# Patient Record
Sex: Male | Born: 1935
Health system: Southern US, Community
[De-identification: ages and names within clinical notes are randomized; demographics above are authoritative.]

## PROBLEM LIST (undated history)

## (undated) DIAGNOSIS — K219 Gastro-esophageal reflux disease without esophagitis: Secondary | ICD-10-CM

## (undated) DIAGNOSIS — L409 Psoriasis, unspecified: Secondary | ICD-10-CM

## (undated) DIAGNOSIS — K4090 Unilateral inguinal hernia, without obstruction or gangrene, not specified as recurrent: Secondary | ICD-10-CM

## (undated) DIAGNOSIS — N400 Enlarged prostate without lower urinary tract symptoms: Secondary | ICD-10-CM

## (undated) DIAGNOSIS — M1990 Unspecified osteoarthritis, unspecified site: Secondary | ICD-10-CM

## (undated) DIAGNOSIS — H04129 Dry eye syndrome of unspecified lacrimal gland: Secondary | ICD-10-CM

## (undated) DIAGNOSIS — R0602 Shortness of breath: Secondary | ICD-10-CM

## (undated) DIAGNOSIS — Z8709 Personal history of other diseases of the respiratory system: Secondary | ICD-10-CM

## (undated) DIAGNOSIS — M810 Age-related osteoporosis without current pathological fracture: Secondary | ICD-10-CM

## (undated) DIAGNOSIS — F419 Anxiety disorder, unspecified: Secondary | ICD-10-CM

## (undated) DIAGNOSIS — R011 Cardiac murmur, unspecified: Secondary | ICD-10-CM

## (undated) DIAGNOSIS — R399 Unspecified symptoms and signs involving the genitourinary system: Secondary | ICD-10-CM

## (undated) DIAGNOSIS — L723 Sebaceous cyst: Secondary | ICD-10-CM

## (undated) DIAGNOSIS — IMO0002 Reserved for concepts with insufficient information to code with codable children: Secondary | ICD-10-CM

## (undated) DIAGNOSIS — C679 Malignant neoplasm of bladder, unspecified: Secondary | ICD-10-CM

## (undated) DIAGNOSIS — J449 Chronic obstructive pulmonary disease, unspecified: Secondary | ICD-10-CM

## (undated) DIAGNOSIS — D696 Thrombocytopenia, unspecified: Secondary | ICD-10-CM

## (undated) DIAGNOSIS — K449 Diaphragmatic hernia without obstruction or gangrene: Secondary | ICD-10-CM

## (undated) DIAGNOSIS — J309 Allergic rhinitis, unspecified: Secondary | ICD-10-CM

## (undated) HISTORY — PX: TONSILLECTOMY: SUR1361

## (undated) HISTORY — DX: Age-related osteoporosis without current pathological fracture: M81.0

## (undated) HISTORY — PX: CATARACT EXTRACTION W/ INTRAOCULAR LENS  IMPLANT, BILATERAL: SHX1307

## (undated) HISTORY — DX: Psoriasis, unspecified: L40.9

## (undated) HISTORY — DX: Sebaceous cyst: L72.3

## (undated) HISTORY — PX: OTHER SURGICAL HISTORY: SHX169

## (undated) HISTORY — DX: Diaphragmatic hernia without obstruction or gangrene: K44.9

## (undated) HISTORY — DX: Allergic rhinitis, unspecified: J30.9

## (undated) HISTORY — DX: Unilateral inguinal hernia, without obstruction or gangrene, not specified as recurrent: K40.90

## (undated) HISTORY — DX: Gastro-esophageal reflux disease without esophagitis: K21.9

## (undated) HISTORY — DX: Reserved for concepts with insufficient information to code with codable children: IMO0002

## (undated) HISTORY — DX: Anxiety disorder, unspecified: F41.9

## (undated) HISTORY — DX: Thrombocytopenia, unspecified: D69.6

## (undated) HISTORY — DX: Benign prostatic hyperplasia without lower urinary tract symptoms: N40.0

## (undated) HISTORY — PX: CARDIOVASCULAR STRESS TEST: SHX262

---
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
## 1996-03-23 HISTORY — PX: TRANSURETHRAL RESECTION OF PROSTATE: SHX73

## 2006-11-16 ENCOUNTER — Encounter: Admission: RE | Admit: 2006-11-16 | Discharge: 2006-11-16 | Payer: Self-pay | Admitting: Internal Medicine

## 2006-11-18 ENCOUNTER — Ambulatory Visit: Payer: Self-pay | Admitting: Family Medicine

## 2006-11-19 DIAGNOSIS — F1320 Sedative, hypnotic or anxiolytic dependence, uncomplicated: Secondary | ICD-10-CM | POA: Insufficient documentation

## 2006-11-19 DIAGNOSIS — M949 Disorder of cartilage, unspecified: Secondary | ICD-10-CM

## 2006-11-19 DIAGNOSIS — M899 Disorder of bone, unspecified: Secondary | ICD-10-CM | POA: Insufficient documentation

## 2006-11-23 ENCOUNTER — Telehealth: Payer: Self-pay | Admitting: *Deleted

## 2006-12-01 ENCOUNTER — Encounter (INDEPENDENT_AMBULATORY_CARE_PROVIDER_SITE_OTHER): Payer: Self-pay | Admitting: Internal Medicine

## 2007-01-12 ENCOUNTER — Ambulatory Visit: Payer: Self-pay | Admitting: Family Medicine

## 2007-02-10 ENCOUNTER — Ambulatory Visit: Payer: Self-pay | Admitting: Family Medicine

## 2007-02-10 DIAGNOSIS — H209 Unspecified iridocyclitis: Secondary | ICD-10-CM | POA: Insufficient documentation

## 2007-02-10 DIAGNOSIS — J309 Allergic rhinitis, unspecified: Secondary | ICD-10-CM | POA: Insufficient documentation

## 2007-02-10 DIAGNOSIS — F411 Generalized anxiety disorder: Secondary | ICD-10-CM | POA: Insufficient documentation

## 2007-02-10 DIAGNOSIS — I498 Other specified cardiac arrhythmias: Secondary | ICD-10-CM

## 2007-02-16 ENCOUNTER — Encounter (INDEPENDENT_AMBULATORY_CARE_PROVIDER_SITE_OTHER): Payer: Self-pay | Admitting: Internal Medicine

## 2007-12-14 ENCOUNTER — Ambulatory Visit: Payer: Self-pay | Admitting: Family Medicine

## 2009-04-04 LAB — PULMONARY FUNCTION TEST

## 2009-05-01 ENCOUNTER — Encounter: Payer: Self-pay | Admitting: Family Medicine

## 2009-10-07 ENCOUNTER — Encounter: Admission: RE | Admit: 2009-10-07 | Discharge: 2009-10-07 | Payer: Self-pay | Admitting: Internal Medicine

## 2009-10-10 HISTORY — PX: TRANSTHORACIC ECHOCARDIOGRAM: SHX275

## 2010-03-13 ENCOUNTER — Encounter: Payer: Self-pay | Admitting: Family Medicine

## 2010-04-22 NOTE — Miscellaneous (Signed)
Summary: ROI  ROI   Imported By: Bradly Bienenstock 05/01/2009 17:09:40  _____________________________________________________________________  External Attachment:    Type:   Image     Comment:   External Document

## 2010-04-24 NOTE — Miscellaneous (Signed)
  Clinical Lists Changes  Problems: Removed problem of NEED PROPHYLACTIC VACCINATION&INOCULATION FLU (ICD-V04.81) Removed problem of HIATAL HERNIA WITH REFLUX, HX OF (ICD-V12.79) Removed problem of History of  PSORIASIS (ICD-696.1) Removed problem of History of  CERUMEN IMPACTION, RECURRENT (ICD-380.4) Removed problem of CATARACT EXTRACTION STATUS (ICD-V45.61)

## 2010-05-12 ENCOUNTER — Encounter: Payer: Self-pay | Admitting: *Deleted

## 2011-06-17 ENCOUNTER — Other Ambulatory Visit: Payer: Self-pay | Admitting: Geriatric Medicine

## 2011-06-17 DIAGNOSIS — R1310 Dysphagia, unspecified: Secondary | ICD-10-CM

## 2011-06-19 ENCOUNTER — Other Ambulatory Visit: Payer: Self-pay

## 2012-08-11 ENCOUNTER — Other Ambulatory Visit: Payer: Self-pay | Admitting: Nurse Practitioner

## 2012-08-11 DIAGNOSIS — R918 Other nonspecific abnormal finding of lung field: Secondary | ICD-10-CM

## 2012-08-12 ENCOUNTER — Ambulatory Visit
Admission: RE | Admit: 2012-08-12 | Discharge: 2012-08-12 | Disposition: A | Discharge status: Home / Self Care | Payer: Medicare PPO | Source: Ambulatory Visit | Attending: Nurse Practitioner | Admitting: Nurse Practitioner

## 2012-08-12 DIAGNOSIS — R918 Other nonspecific abnormal finding of lung field: Secondary | ICD-10-CM

## 2012-08-12 MED ORDER — IOHEXOL 300 MG/ML  SOLN
75.0000 mL | Freq: Once | INTRAMUSCULAR | Status: AC | PRN
  Administered 2012-08-12: 75 mL via INTRAVENOUS

## 2012-08-17 ENCOUNTER — Encounter: Payer: Medicare PPO | Admitting: Thoracic Surgery (Cardiothoracic Vascular Surgery)

## 2012-08-17 ENCOUNTER — Other Ambulatory Visit: Payer: Self-pay | Admitting: *Deleted

## 2012-08-17 ENCOUNTER — Other Ambulatory Visit: Payer: Self-pay

## 2012-08-17 ENCOUNTER — Institutional Professional Consult (permissible substitution) (INDEPENDENT_AMBULATORY_CARE_PROVIDER_SITE_OTHER): Payer: Medicare PPO | Admitting: Thoracic Surgery (Cardiothoracic Vascular Surgery)

## 2012-08-17 VITALS — BP 130/72 | HR 60 | Resp 18 | Ht 71.0 in | Wt 144.0 lb

## 2012-08-17 DIAGNOSIS — R222 Localized swelling, mass and lump, trunk: Secondary | ICD-10-CM

## 2012-08-17 DIAGNOSIS — J850 Gangrene and necrosis of lung: Secondary | ICD-10-CM | POA: Insufficient documentation

## 2012-08-17 DIAGNOSIS — R918 Other nonspecific abnormal finding of lung field: Secondary | ICD-10-CM

## 2012-08-17 DIAGNOSIS — D381 Neoplasm of uncertain behavior of trachea, bronchus and lung: Secondary | ICD-10-CM

## 2012-08-17 NOTE — Progress Notes (Signed)
PCP is Loreli Slot, MD Referring Provider is Stoneking, Danford Bad., *  Chief Complaint  Patient presents with  . Lung Mass    surgical eval on lung mass seen on Chest CT 08/12/12    HPI: 79 year old sent for consultation regarding a right apical mass.  Mr. Cieslinski is a 79 year old male with a history of pipe smoking (quit 35 years ago). He was in his usual state of health until about 3 weeks ago. He developed a sinus infection. These are quite common for him. He was treated with Cipro and prednisone. About 10 days ago he noted some blood in his sputum. He had been coughing up mucous about 3 or 4 times a day. He was not having difficulty bringing up the mucous. He first noted some streaks of blood in the sputum. Over the past 10 days his continued to notice some times purulent sputum, sometimes mixed with old blood and sometimes with streaks of fresh blood. He's not having any chest pain or shoulder or arm pain. He has not noted his eyelid drooping. A chest x-ray was done which showed a suggestion of a right upper lobe mass. A CT of the chest was done on 08/12/2012 and it showed a mass in the right apex adjacent to the posterior chest wall in an area of old scar (which was present on CT done in 2011). There is no mediastinal or hilar adenopathy.  He says that over the past 6-7 years she said shortness of breath with initiation of exertion. He had been worked up for cardiac stress test in 2008 which was negative. He had normal pulmonary function testing in 2011. He says that he gets short of breath when he first starts to exert himself such as when he first gets on a treadmill her first starts walking faster. If he continues on the shortness of breath will resolve. He says that his total exercise tolerance has gone down slightly since he got the sinus infection. He has had a mild sensation of tightness associated with shortness of breath. He denies weight loss or loss of appetite.  Past Medical  History  Diagnosis Date  . Lung mass   . BPH (benign prostatic hyperplasia)   . GERD (gastroesophageal reflux disease)   . Psoriasis   . Left inguinal hernia   . Osteoporosis   . Anxiety   . Hiatal hernia   . Positional vertigo   . Prolapse of mitral valve     click  . Sebaceous cyst     left shoulder  . Allergic rhinitis   . CAD (coronary artery disease) 08/12/12    LAD calcification noted on CT    Past Surgical History  Procedure Laterality Date  . Appendectomy      1945  . Transurethral resection of prostate      Family History  Problem Relation Age of Onset  . Hyperlipidemia Father   . Hypertension Father   . Diabetes Father   . Colon polyps Mother   . Heart disease Father   . Heart disease Brother     #1  . Heart disease Brother     #2    Social History History  Substance Use Topics  . Smoking status: Former Smoker -- 1.00 packs/day for 15 years    Types: Pipe    Quit date: 03/23/1968  . Smokeless tobacco: Not on file  . Alcohol Use: Yes    Current Outpatient Prescriptions  Medication Sig Dispense Refill  . Calcium  Citrate-Vitamin D 315-250 MG-UNIT TABS Take by mouth daily.      . fish oil-omega-3 fatty acids 1000 MG capsule Take 1 g by mouth daily.      . Hydrocortisone Butyr Lipo Base 0.1 % CREA Apply topically as needed.      Marland Kitchen LORazepam (ATIVAN) 0.5 MG tablet Take 0.5 mg by mouth 3 (three) times daily.        . Multiple Vitamins-Minerals (ICAPS PO) Take by mouth 2 (two) times daily.       No current facility-administered medications for this visit.    No Known Allergies  Review of Systems  Constitutional: Positive for chills. Negative for fever, activity change, appetite change, fatigue and unexpected weight change.  HENT: Positive for congestion, rhinorrhea, sneezing and postnasal drip.   Respiratory: Positive for cough (+ hemoptysis), chest tightness (vague with SOB) and shortness of breath (with initiation of exercise).   Cardiovascular:  Negative for chest pain, palpitations and leg swelling.  Gastrointestinal:       Hiatal hernia, belching  Genitourinary: Positive for urgency and frequency.       TURP 15 years ago  Skin:       psoriasis  Neurological: Negative for syncope and speech difficulty.  All other systems reviewed and are negative.    BP 130/72  Pulse 60  Resp 18  Ht 5\' 11"  (1.803 m)  Wt 144 lb (65.318 kg)  BMI 20.09 kg/m2  SpO2 98% Physical Exam  Vitals reviewed. Constitutional: He is oriented to person, place, and time. He appears well-developed and well-nourished. No distress.  HENT:  Head: Normocephalic and atraumatic.  Eyes: EOM are normal. Pupils are equal, round, and reactive to light.  Neck: Neck supple. No thyromegaly present.  No bruits  Cardiovascular: Normal rate, regular rhythm, normal heart sounds and intact distal pulses.  Exam reveals no gallop and no friction rub.   No murmur heard. Pulmonary/Chest: Effort normal and breath sounds normal. He has no wheezes. He has no rales. He exhibits no tenderness.  Abdominal: Soft. There is no tenderness.  Musculoskeletal: He exhibits no edema.  Lymphadenopathy:    He has no cervical adenopathy.  Neurological: He is alert and oriented to person, place, and time. No cranial nerve deficit.  Skin: Skin is warm and dry.     Diagnostic Tests: CT of chest 08/12/2012 Clinical Data: Abnormal chest x-ray with a mass questioned in the  right lung apex. History of hemoptysis, pipe smoking for many  years  CT CHEST WITH CONTRAST  Technique: Multidetector CT imaging of the chest was performed  following the standard protocol during bolus administration of  intravenous contrast.  Contrast: 75mL OMNIPAQUE IOHEXOL 300 MG/ML SOLN  Comparison: Chest x-ray of 08/11/2012 and CT chest of 10/07/2009  Findings: There is a soft tissue mass within the right lung apex  abutting the pleura measuring 3.3 x 2.6 x 4.8 cm containing some  internal calcifications.  This mass is worrisome for malignancy,  possibly a superior sulcus tumor. PET CT is recommended to assess  for any mediastinal or metastatic involvement. No other lung  nodule is seen. No pleural effusion is noted.  On soft tissue window images, the thyroid gland is unremarkable.  No definite mediastinal or hilar involvement is seen. There is  coronary artery calcifications primarily involving the left  anterior descending artery. Mild cardiomegaly is noted and there  is a small pericardial effusion noted. There appears to be a  gastric fundal diverticulum present. The liver is  unremarkable.  The thoracic spine is unremarkable other than a slight curvature  convex to the right.  IMPRESSION:  1. 3.3 x 2.6 x 4.8 cm soft tissue mass in the right lung apex with  internal calcifications, suspicious for lung carcinoma, possibly a  superior sulcus tumor. Recommend PET-CT.  2. No definite mediastinal or hilar adenopathy.  3. Mild cardiomegaly with a small pericardial effusion.  4. Calcifications of the left anterior descending coronary artery.  Original Report Authenticated By: Dwyane Dee, M.D.   Impression: Mr. Guinta is a 79 year old gentleman with a newly discovered right apical soft tissue mass. This is high in the right apex adjacent to the posterior chest wall. It is possibly a superior sulcus tumor but he does not had any of the classic Pancoast symptoms. In any event is highly suspicious for primary bronchogenic carcinoma. It has to be considered that unless he can be proven otherwise.  Mr. Canterbury is an very good shape overall and I think he could tolerate a very aggressive approach to treatment. My initial impression would be the best way to proceed with be to biopsy the mass to confirm that his cancer and then treat with chemotherapy and radiation therapy to be followed by resection.  I recommended that we do a CT guided needle biopsy to establish a diagnosis. He also needs a PET/CT for  staging purposes. We will repeat his pulmonary function testing even though it was normal only a few years ago. Finally he had pretty significant calcification of his LAD on the CT of his chest. Therefore do think he would need to be cleared by cardiologist prior to surgery.  I had a long discussion with Mr. Dicostanzo regarding the findings and my recommendations. I think he has a good understanding of the approach outlined.  Plan: CT-guided needle biopsy  PET/CT  Pulmonary function testing with room air blood gas  Cardiology consult  Will plan to see him back next week. If the biopsy is positive we will plan to have him seen by oncology and radiation oncology in [place].

## 2012-08-19 ENCOUNTER — Other Ambulatory Visit: Payer: Self-pay | Admitting: *Deleted

## 2012-08-19 ENCOUNTER — Other Ambulatory Visit: Payer: Self-pay | Admitting: Radiology

## 2012-08-22 ENCOUNTER — Ambulatory Visit (HOSPITAL_COMMUNITY)
Admission: RE | Admit: 2012-08-22 | Discharge: 2012-08-22 | Disposition: A | Discharge status: Home / Self Care | Payer: Medicare PPO | Source: Ambulatory Visit | Attending: Thoracic Surgery (Cardiothoracic Vascular Surgery) | Admitting: Thoracic Surgery (Cardiothoracic Vascular Surgery)

## 2012-08-22 ENCOUNTER — Encounter (HOSPITAL_COMMUNITY)
Admission: RE | Admit: 2012-08-22 | Discharge: 2012-08-22 | Disposition: A | Discharge status: Home / Self Care | Payer: Medicare PPO | Source: Ambulatory Visit | Attending: Thoracic Surgery (Cardiothoracic Vascular Surgery) | Admitting: Thoracic Surgery (Cardiothoracic Vascular Surgery)

## 2012-08-22 DIAGNOSIS — R042 Hemoptysis: Secondary | ICD-10-CM | POA: Insufficient documentation

## 2012-08-22 DIAGNOSIS — J3489 Other specified disorders of nose and nasal sinuses: Secondary | ICD-10-CM | POA: Insufficient documentation

## 2012-08-22 DIAGNOSIS — R222 Localized swelling, mass and lump, trunk: Secondary | ICD-10-CM | POA: Insufficient documentation

## 2012-08-22 DIAGNOSIS — R05 Cough: Secondary | ICD-10-CM | POA: Insufficient documentation

## 2012-08-22 DIAGNOSIS — R0789 Other chest pain: Secondary | ICD-10-CM | POA: Insufficient documentation

## 2012-08-22 DIAGNOSIS — R0602 Shortness of breath: Secondary | ICD-10-CM | POA: Insufficient documentation

## 2012-08-22 DIAGNOSIS — R918 Other nonspecific abnormal finding of lung field: Secondary | ICD-10-CM

## 2012-08-22 DIAGNOSIS — R059 Cough, unspecified: Secondary | ICD-10-CM | POA: Insufficient documentation

## 2012-08-22 LAB — PULMONARY FUNCTION TEST

## 2012-08-22 LAB — GLUCOSE, CAPILLARY: Glucose-Capillary: 103 mg/dL — ABNORMAL HIGH (ref 70–99)

## 2012-08-22 MED ORDER — FLUDEOXYGLUCOSE F - 18 (FDG) INJECTION
17.8000 | Freq: Once | INTRAVENOUS | Status: AC | PRN
  Administered 2012-08-22: 17.8 via INTRAVENOUS

## 2012-08-22 MED ORDER — ALBUTEROL SULFATE (5 MG/ML) 0.5% IN NEBU
2.5000 mg | INHALATION_SOLUTION | Freq: Once | RESPIRATORY_TRACT | Status: AC
  Administered 2012-08-22: 2.5 mg via RESPIRATORY_TRACT

## 2012-08-23 ENCOUNTER — Encounter (HOSPITAL_COMMUNITY): Payer: Self-pay | Admitting: Pharmacy Technician

## 2012-08-24 ENCOUNTER — Encounter (HOSPITAL_COMMUNITY): Payer: Self-pay

## 2012-08-24 ENCOUNTER — Ambulatory Visit (HOSPITAL_COMMUNITY)
Admission: RE | Admit: 2012-08-24 | Discharge: 2012-08-24 | Disposition: A | Discharge status: Home / Self Care | Payer: Medicare PPO | Source: Ambulatory Visit | Attending: Interventional Radiology | Admitting: Interventional Radiology

## 2012-08-24 ENCOUNTER — Ambulatory Visit (HOSPITAL_COMMUNITY)
Admission: RE | Admit: 2012-08-24 | Discharge: 2012-08-24 | Disposition: A | Discharge status: Home / Self Care | Payer: Medicare PPO | Source: Ambulatory Visit | Attending: Thoracic Surgery (Cardiothoracic Vascular Surgery) | Admitting: Thoracic Surgery (Cardiothoracic Vascular Surgery)

## 2012-08-24 DIAGNOSIS — R222 Localized swelling, mass and lump, trunk: Secondary | ICD-10-CM | POA: Insufficient documentation

## 2012-08-24 DIAGNOSIS — M810 Age-related osteoporosis without current pathological fracture: Secondary | ICD-10-CM | POA: Insufficient documentation

## 2012-08-24 DIAGNOSIS — I2510 Atherosclerotic heart disease of native coronary artery without angina pectoris: Secondary | ICD-10-CM | POA: Insufficient documentation

## 2012-08-24 DIAGNOSIS — J984 Other disorders of lung: Secondary | ICD-10-CM | POA: Insufficient documentation

## 2012-08-24 DIAGNOSIS — K219 Gastro-esophageal reflux disease without esophagitis: Secondary | ICD-10-CM | POA: Insufficient documentation

## 2012-08-24 DIAGNOSIS — J8410 Pulmonary fibrosis, unspecified: Secondary | ICD-10-CM | POA: Insufficient documentation

## 2012-08-24 DIAGNOSIS — D381 Neoplasm of uncertain behavior of trachea, bronchus and lung: Secondary | ICD-10-CM

## 2012-08-24 DIAGNOSIS — N400 Enlarged prostate without lower urinary tract symptoms: Secondary | ICD-10-CM | POA: Insufficient documentation

## 2012-08-24 LAB — CBC
MCV: 82.6 fL (ref 78.0–100.0)
Platelets: 185 10*3/uL (ref 150–400)
RDW: 13.8 % (ref 11.5–15.5)
WBC: 5.1 10*3/uL (ref 4.0–10.5)

## 2012-08-24 LAB — APTT: aPTT: 42 seconds — ABNORMAL HIGH (ref 24–37)

## 2012-08-24 LAB — PROTIME-INR: Prothrombin Time: 13.3 seconds (ref 11.6–15.2)

## 2012-08-24 MED ORDER — SODIUM CHLORIDE 0.9 % IV SOLN
INTRAVENOUS | Status: DC
  Administered 2012-08-24: 20 mL/h via INTRAVENOUS

## 2012-08-24 MED ORDER — MIDAZOLAM HCL 2 MG/2ML IJ SOLN
INTRAMUSCULAR | Status: AC
  Filled 2012-08-24: qty 4

## 2012-08-24 MED ORDER — FENTANYL CITRATE 0.05 MG/ML IJ SOLN
INTRAMUSCULAR | Status: AC
  Filled 2012-08-24: qty 4

## 2012-08-24 MED ORDER — MIDAZOLAM HCL 2 MG/2ML IJ SOLN
INTRAMUSCULAR | Status: AC | PRN
  Administered 2012-08-24 (×2): 1 mg via INTRAVENOUS

## 2012-08-24 MED ORDER — FENTANYL CITRATE 0.05 MG/ML IJ SOLN
INTRAMUSCULAR | Status: AC | PRN
  Administered 2012-08-24: 100 ug via INTRAVENOUS

## 2012-08-24 NOTE — Procedures (Signed)
Interventional Radiology Procedure Note  Procedure: CT guided biopsy of right apical PET + mass Complications: No immediate Recommendations: - Bedrest until CXR cleared.  Minimize talking, coughing or otherwise straining.  - Follow up 2 hr CXR pending   Signed,  Sterling Big, MD Vascular & Interventional Radiologist [facility] Radiology

## 2012-08-24 NOTE — H&P (Signed)
Agree with PA note.  PET+ right apical mass concerning for primary bronchogenic ca.  Will proceed with bx.  Signed,  Sterling Big, MD Vascular & Interventional Radiologist [facility] Radiology

## 2012-08-24 NOTE — H&P (Signed)
Tony Prince is an 79 y.o. male.   Chief Complaint: "Im here to get a lung biopsy" HPI: Patient with prior history of smoking and hypermetabolic right apical lung mass noted on recent PET presents today for CT guided right lung mass biopsy.  Past Medical History  Diagnosis Date  . Lung mass   . BPH (benign prostatic hyperplasia)   . GERD (gastroesophageal reflux disease)   . Psoriasis   . Left inguinal hernia   . Osteoporosis   . Anxiety   . Hiatal hernia   . Positional vertigo   . Prolapse of mitral valve     click  . Sebaceous cyst     left shoulder  . Allergic rhinitis   . CAD (coronary artery disease) 08/12/12    LAD calcification noted on CT    Past Surgical History  Procedure Laterality Date  . Appendectomy      1945  . Transurethral resection of prostate      Family History  Problem Relation Age of Onset  . Hyperlipidemia Father   . Hypertension Father   . Diabetes Father   . Colon polyps Mother   . Heart disease Father   . Heart disease Brother     #1  . Heart disease Brother     #2   Social History:  reports that he quit smoking about 44 years ago. His smoking use included Pipe. He does not have any smokeless tobacco history on file. He reports that  drinks alcohol. He reports that he does not use illicit drugs.  Allergies: No Known Allergies  Current outpatient prescriptions:Calcium Citrate-Vitamin D 315-250 MG-UNIT TABS, Take by mouth daily., Disp: , Rfl: ;  fish oil-omega-3 fatty acids 1000 MG capsule, Take 1 g by mouth daily., Disp: , Rfl: ;  Hydrocortisone Butyr Lipo Base 0.1 % CREA, Apply 1 application topically as needed. psoriasis, Disp: , Rfl: ;  LORazepam (ATIVAN) 0.5 MG tablet, Take 0.5 mg by mouth 3 (three) times daily.  , Disp: , Rfl:  Multiple Vitamins-Minerals (ICAPS PO), Take 2 tablets by mouth daily. , Disp: , Rfl: ;  Polyethyl Glycol-Propyl Glycol (SYSTANE ULTRA) 0.4-0.3 % SOLN, Apply 1 drop to eye 2 (two) times daily as needed (dry eyes).,  Disp: , Rfl:  Current facility-administered medications:0.9 %  sodium chloride infusion, , Intravenous, Continuous, Brayton El, PA-C, Last Rate: 20 mL/hr at 08/24/12 0907, 20 mL/hr at 08/24/12 0907   Results for orders placed during the hospital encounter of 08/24/12 (from the past 48 hour(s))  APTT     Status: Abnormal   Collection Time    08/24/12  9:05 AM      Result Value Range   aPTT 42 (*) 24 - 37 seconds   Comment:            IF BASELINE aPTT IS ELEVATED,     SUGGEST PATIENT RISK ASSESSMENT     BE USED TO DETERMINE APPROPRIATE     ANTICOAGULANT THERAPY.  CBC     Status: None   Collection Time    08/24/12  9:05 AM      Result Value Range   WBC 5.1  4.0 - 10.5 K/uL   RBC 5.07  4.22 - 5.81 MIL/uL   Hemoglobin 14.3  13.0 - 17.0 g/dL   HCT 11.9  14.7 - 82.9 %   MCV 82.6  78.0 - 100.0 fL   MCH 28.2  26.0 - 34.0 pg   MCHC 34.1  30.0 - 36.0  g/dL   RDW 09.8  11.9 - 14.7 %   Platelets 185  150 - 400 K/uL  PROTIME-INR     Status: None   Collection Time    08/24/12  9:05 AM      Result Value Range   Prothrombin Time 13.3  11.6 - 15.2 seconds   INR 1.02  0.00 - 1.49   Nm Pet Image Initial (pi) Skull Base To Thigh  08/22/2012   *RADIOLOGY REPORT*  Clinical Data: Initial treatment strategy for right upper lobe lung mass.  NUCLEAR MEDICINE PET SKULL BASE TO THIGH  Fasting Blood Glucose:  103  Technique:  17.8 mCi F-18 FDG was injected intravenously. CT data was obtained and used for attenuation correction and anatomic localization only.  (This was not acquired as a diagnostic CT examination.) Additional exam technical data entered on technologist worksheet.  Comparison:  Chest CT 08/12/2012.  Findings:  Neck: No hypermetabolic lymph nodes in the neck.  Chest:  The right apical lung mass demonstrates marked FDG uptake with SUV max of 54.0.  No hypermetabolic mediastinal or hilar lymph nodes.  Abdomen/Pelvis:  No abnormal hypermetabolic activity within the liver, pancreas, adrenal glands,  or spleen.  No hypermetabolic lymph nodes in the abdomen or pelvis.  Skeleton:  No focal hypermetabolic activity to suggest skeletal metastasis.  IMPRESSION:  1.  Hypermetabolic right apical lung mass consistent with primary lung neoplasm. 2.  No findings for mediastinal/hilar adenopathy or metastatic disease in the chest, abdomen or pelvis.   Original Report Authenticated By: Rudie Meyer, M.D.    Review of Systems  Constitutional: Negative for fever and chills.  HENT:       Recent nosebleed following exercise  Respiratory: Positive for cough, hemoptysis and shortness of breath.   Cardiovascular: Negative for chest pain.  Gastrointestinal: Negative for nausea, vomiting and abdominal pain.  Genitourinary: Positive for urgency and frequency.  Musculoskeletal: Positive for back pain.       Occ rt shoulder pain  Neurological: Negative for headaches.    Blood pressure 127/79, pulse 65, temperature 97.6 F (36.4 C), temperature source Oral, resp. rate 20, height 5\' 11"  (1.803 m), weight 144 lb (65.318 kg), SpO2 100.00%. Physical Exam  Constitutional: He is oriented to person, place, and time.  Thin elderly male in NAD  Cardiovascular: Normal rate and regular rhythm.   Respiratory: Effort normal and breath sounds normal.  GI: Soft. Bowel sounds are normal. He exhibits no distension. There is no tenderness.  Musculoskeletal: Normal range of motion. He exhibits no edema.  Neurological: He is alert and oriented to person, place, and time.     Assessment/Plan Pt with prior hx of smoking , now with hypermetabolic right apical lung mass on recent PET scan. Plan is for CT guided biopsy of the lung mass today. Details/risks of procedure d/w pt with his understanding and consent.  ALLRED,D KEVIN 08/24/2012, 9:41 AM

## 2012-08-30 ENCOUNTER — Ambulatory Visit (INDEPENDENT_AMBULATORY_CARE_PROVIDER_SITE_OTHER): Payer: Medicare PPO | Admitting: Thoracic Surgery (Cardiothoracic Vascular Surgery)

## 2012-08-30 ENCOUNTER — Encounter: Payer: Self-pay | Admitting: Thoracic Surgery (Cardiothoracic Vascular Surgery)

## 2012-08-30 VITALS — BP 130/74 | HR 70 | Resp 20 | Ht 71.0 in | Wt 144.0 lb

## 2012-08-30 DIAGNOSIS — R918 Other nonspecific abnormal finding of lung field: Secondary | ICD-10-CM

## 2012-08-30 DIAGNOSIS — Z9889 Other specified postprocedural states: Secondary | ICD-10-CM

## 2012-08-30 DIAGNOSIS — R222 Localized swelling, mass and lump, trunk: Secondary | ICD-10-CM

## 2012-08-30 NOTE — Progress Notes (Signed)
HPI:  Mr. Curran returns to discuss the results of his PET/CT and biopsy. He is a 79 year old gentleman with a past history of pipe smoking. He recently developed hemoptysis. Workup revealed a mass in the right apex associated with old scar tissue. This is highly suspicious for lung cancer. A PET CT was done which showed the lesion to be markedly hypermetabolic with an SUV of 54. He also had a needle biopsy done which showed inflammation, fibrosis, and calcification. No malignancy was seen on the biopsy.  In the interim since his last visit Mr. Fessel says that he had hemoptysis and bleeding from his nose after working out. He felt this was from the lungs however from his description it sounds more like a nosebleed. Since that time he has noted a decrease in the amount of hemoptysis.  Past Medical History  Diagnosis Date  . Lung mass   . BPH (benign prostatic hyperplasia)   . GERD (gastroesophageal reflux disease)   . Psoriasis   . Left inguinal hernia   . Osteoporosis   . Anxiety   . Hiatal hernia   . Positional vertigo   . Prolapse of mitral valve     click  . Sebaceous cyst     left shoulder  . Allergic rhinitis   . CAD (coronary artery disease) 08/12/12    LAD calcification noted on CT      Current Outpatient Prescriptions  Medication Sig Dispense Refill  . Calcium Citrate-Vitamin D 315-250 MG-UNIT TABS Take by mouth daily.      . fish oil-omega-3 fatty acids 1000 MG capsule Take 1 g by mouth daily.      . Hydrocortisone Butyr Lipo Base 0.1 % CREA Apply 1 application topically as needed. psoriasis      . LORazepam (ATIVAN) 0.5 MG tablet Take 0.5 mg by mouth 3 (three) times daily.        . Multiple Vitamins-Minerals (ICAPS PO) Take 2 tablets by mouth daily.       Bertram Gala Glycol-Propyl Glycol (SYSTANE ULTRA) 0.4-0.3 % SOLN Apply 1 drop to eye 2 (two) times daily as needed (dry eyes).       No current facility-administered medications for this visit.    Physical Exam BP  130/74  Pulse 70  Resp 20  Ht 5\' 11"  (1.803 m)  Wt 144 lb (65.318 kg)  BMI 20.09 kg/m2  SpO2 11% Then 79 year old male in no acute distress Neurologic alert and oriented x3 with no focal deficits Lungs clear with equal breath sounds bilaterally Cardiac regular rate and rhythm normal S1 and S2  Diagnostic Tests: PET/CT  *RADIOLOGY REPORT*  Clinical Data: Initial treatment strategy for right upper lobe lung  mass.  NUCLEAR MEDICINE PET SKULL BASE TO THIGH  Fasting Blood Glucose: 103  Technique: 17.8 mCi F-18 FDG was injected intravenously. CT data  was obtained and used for attenuation correction and anatomic  localization only. (This was not acquired as a diagnostic CT  examination.) Additional exam technical data entered on  technologist worksheet.  Comparison: Chest CT 08/12/2012.  Findings:  Neck: No hypermetabolic lymph nodes in the neck.  Chest: The right apical lung mass demonstrates marked FDG uptake  with SUV max of 54.0. No hypermetabolic mediastinal or hilar lymph  nodes.  Abdomen/Pelvis: No abnormal hypermetabolic activity within the  liver, pancreas, adrenal glands, or spleen. No hypermetabolic  lymph nodes in the abdomen or pelvis.  Skeleton: No focal hypermetabolic activity to suggest skeletal  metastasis.  IMPRESSION:  1. Hypermetabolic right apical lung mass consistent with primary  lung neoplasm.  2. No findings for mediastinal/hilar adenopathy or metastatic  disease in the chest, abdomen or pelvis.  Original Report Authenticated By: Rudie Meyer, M.D.  Pathology  Lung, needle/core biopsies, right apical lung mass Inflammation, fibrosis and calcification. No malignancy identified  Pulmonary function testing FVC 3.45 (80%) FEV1 2.49 (80%) FEV1/FVC 72% (normal) DLCO 55%  Impression: 79 year old gentleman with a right upper lobe mass associated with old scar tissue. Needle biopsy showed fibrosis and inflammation but no malignancy. However the nodule  was markedly hypermetabolic with an SUV of 54 on PET CT. I had a long discussion with Mr. Quam regarding these results and the implications of a negative needle biopsy. I explained that there is a possibility of a false negative with a needle biopsy. Given the overall clinical suspicion I would not be comfortable observing this mass. I recommended right thoracoscopic wedge resection, with a right upper lobectomy this turned out to be cancer on frozen section. The other option would be observation with CT scans every 3 months. However I very strongly discouraged observation in his case given the strong clinical suspicion. Given that the needle biopsy was negative, it takes the possibility of preoperative neoadjuvant therapy off the table.  I did discuss with him the proposed operative procedure. We discussed the general nature of the surgery, use of minimally invasive techniques as much as possible, need for general anesthesia, expected hospital stay and overall recovery period. We discussed the indications, risks, benefits, and alternatives (observation). He understands the risk include but are not limited to death, MI, DVT, PE, bleeding, possible need for transfusion, infection, air leak, cardiac arrhythmias, as well as other unforeseeable complications.  He had any questions which I asked her to this my ability. I spent a great deal of time explaining that the needle biopsy does not definitively rule out cancer and I still think this mass needs to come out. I do think he had a good understanding. He does was stuck to his family before making any final decisions. I highly encouraged him to do so.  We do need to get him in to see a cardiologist. He knows Dr. Katrinka Blazing and Dr. Eldridge Dace from previous interactions with his wife. We will see if one can see him in the near future.  Plan: I've asked him to come back and see me next week for further discussion entries had a chance to discuss this with his  family.

## 2012-08-31 ENCOUNTER — Telehealth: Payer: Self-pay | Admitting: Critical Care Medicine

## 2012-08-31 NOTE — Telephone Encounter (Signed)
Spoke with Shirlee Limerick.  Pt scheduled for Consult with PW on Monday at 1:15 pm.

## 2012-09-05 ENCOUNTER — Ambulatory Visit (INDEPENDENT_AMBULATORY_CARE_PROVIDER_SITE_OTHER): Payer: Medicare PPO | Admitting: Critical Care Medicine

## 2012-09-05 ENCOUNTER — Encounter: Payer: Self-pay | Admitting: Critical Care Medicine

## 2012-09-05 VITALS — BP 122/82 | HR 60 | Temp 98.0°F | Ht 71.0 in | Wt 144.0 lb

## 2012-09-05 DIAGNOSIS — R918 Other nonspecific abnormal finding of lung field: Secondary | ICD-10-CM

## 2012-09-05 DIAGNOSIS — R222 Localized swelling, mass and lump, trunk: Secondary | ICD-10-CM

## 2012-09-05 NOTE — Patient Instructions (Addendum)
You are cleared lung wise for lung surgery No change in medications I will check on you postop

## 2012-09-05 NOTE — Progress Notes (Signed)
Subjective:    Patient ID: Tony Prince, male    DOB: Dec 16, 1932, 79 y.o.   MRN: [id number]  HPI 79 y.o. WM RUL mass prob CA This patient presented with an episode of cough and hemoptysis 3 weeks prior to this visit. The patient had chest x-ray which showed increased density right upper lobe and the patient is an underwent CT scan of chest which showed probable malignancy right upper lobe. The patient saw thoracic surgery and he recommended a needle biopsy which was nondiagnostic. The patient is now been recommended surgical approach and the patient comes to this office for a second opinion on the possibility of a surgical approach.  Not coughing up any blood last few days. Pt has pndrip Prior sinus infection issues.  Notes some dyspnea with exertion, had this for 7 yrs   Past Medical History  Diagnosis Date  . Lung mass   . BPH (benign prostatic hyperplasia)   . GERD (gastroesophageal reflux disease)   . Psoriasis   . Left inguinal hernia   . Osteoporosis   . Anxiety   . Hiatal hernia   . Positional vertigo   . Prolapse of mitral valve     click  . Sebaceous cyst     left shoulder  . Allergic rhinitis   . CAD (coronary artery disease) 08/12/12    LAD calcification noted on CT     Family History  Problem Relation Age of Onset  . Hyperlipidemia Father   . Hypertension Father   . Diabetes Father   . Colon polyps Mother   . Heart disease Father   . Heart disease Brother     #1  . Heart disease Brother     #2     History   Social History  . Marital Status: Widowed    Spouse Name: N/A    Number of Children: 2  . Years of Education: N/A   Occupational History  . professor phyloscopy and religion     at [employer]   Social History Main Topics  . Smoking status: Former Smoker -- 1.00 packs/day for 15 years    Types: Pipe    Quit date: 03/23/1968  . Smokeless tobacco: Not on file  . Alcohol Use: Yes  . Drug Use: No  . Sexually Active: Not on file   Other  Topics Concern  . Not on file   Social History Narrative  . No narrative on file     No Known Allergies   Outpatient Prescriptions Prior to Visit  Medication Sig Dispense Refill  . Calcium Citrate-Vitamin D 315-250 MG-UNIT TABS Take by mouth daily.      . fish oil-omega-3 fatty acids 1000 MG capsule Take 1 g by mouth daily.      . Hydrocortisone Butyr Lipo Base 0.1 % CREA Apply 1 application topically as needed. psoriasis      . LORazepam (ATIVAN) 0.5 MG tablet Take 0.5 mg by mouth 3 (three) times daily.        . Multiple Vitamins-Minerals (ICAPS PO) Take 2 tablets by mouth daily.       Bertram Gala Glycol-Propyl Glycol (SYSTANE ULTRA) 0.4-0.3 % SOLN Apply 1 drop to eye 2 (two) times daily as needed (dry eyes).       No facility-administered medications prior to visit.     Review of Systems  Constitutional: Positive for fatigue. Negative for fever, chills, diaphoresis, activity change, appetite change and unexpected weight change.  HENT: Positive for congestion, rhinorrhea,  neck pain, neck stiffness, postnasal drip and sinus pressure. Negative for hearing loss, ear pain, nosebleeds, sore throat, facial swelling, sneezing, mouth sores, trouble swallowing, dental problem, voice change, tinnitus and ear discharge.   Eyes: Negative for photophobia, discharge, itching and visual disturbance.  Respiratory: Positive for cough, chest tightness and shortness of breath. Negative for apnea, choking, wheezing and stridor.   Cardiovascular: Negative for chest pain, palpitations and leg swelling.  Gastrointestinal: Negative for nausea, vomiting, abdominal pain, constipation, blood in stool and abdominal distention.  Genitourinary: Negative for dysuria, urgency, frequency, hematuria, flank pain, decreased urine volume and difficulty urinating.  Musculoskeletal: Negative for myalgias, back pain, joint swelling, arthralgias and gait problem.  Skin: Negative for color change, pallor and rash.   Neurological: Negative for dizziness, tremors, seizures, syncope, speech difficulty, weakness, light-headedness, numbness and headaches.  Hematological: Negative for adenopathy. Does not bruise/bleed easily.  Psychiatric/Behavioral: Negative for confusion, sleep disturbance and agitation. The patient is not nervous/anxious.        Objective:   Physical Exam Filed Vitals:   09/05/12 1315  BP: 122/82  Pulse: 60  Temp: 98 F (36.7 C)  TempSrc: Oral  Height: 5\' 11"  (1.803 m)  Weight: 144 lb (65.318 kg)  SpO2: 99%    Gen: Pleasant, well-nourished, in no distress,  normal affect  ENT: No lesions,  mouth clear,  oropharynx clear, no postnasal drip  Neck: No JVD, no TMG, no carotid bruits  Lungs: No use of accessory muscles, no dullness to percussion, clear without rales or rhonchi  Cardiovascular: RRR, heart sounds normal, no murmur or gallops, no peripheral edema  Abdomen: soft and NT, no HSM,  BS normal  Musculoskeletal: No deformities, no cyanosis or clubbing  Neuro: alert, non focal  Skin: Warm, no lesions or rashes  No results found. Ct Chest reviewed. See epic  Restrictive lung defect seen on pulmonary function studies but normal skull monitoring     Assessment & Plan:   Lung mass Right upper lobe lung mass with central calcification likely a malignancy until proven otherwise Pulmonary function stable at this time and the patient is ready to undergo thoracic surgical intervention Note recent negative needle biopsy should not deter resection of this mass which appears malignant on PET scan The patient had multiple questions regarding the advisability of surgery and appeared of answered all of his questions to his satisfaction. The patient verbalizes understanding and he needs to proceed with surgical approach first with Dr. Dorris Fetch Pulmonary will be available as needed during the   Updated Medication List Outpatient Encounter Prescriptions as of 09/05/2012   Medication Sig Dispense Refill  . Calcium Citrate-Vitamin D 315-250 MG-UNIT TABS Take by mouth daily.      . fish oil-omega-3 fatty acids 1000 MG capsule Take 1 g by mouth daily.      . Hydrocortisone Butyr Lipo Base 0.1 % CREA Apply 1 application topically as needed. psoriasis      . LORazepam (ATIVAN) 0.5 MG tablet Take 0.5 mg by mouth 3 (three) times daily.        . Multiple Vitamins-Minerals (ICAPS PO) Take 2 tablets by mouth daily.       Bertram Gala Glycol-Propyl Glycol (SYSTANE ULTRA) 0.4-0.3 % SOLN Apply 1 drop to eye 2 (two) times daily as needed (dry eyes).       No facility-administered encounter medications on file as of 09/05/2012.

## 2012-09-06 ENCOUNTER — Encounter (HOSPITAL_COMMUNITY): Payer: Self-pay | Admitting: Pharmacy Technician

## 2012-09-06 ENCOUNTER — Other Ambulatory Visit: Payer: Self-pay | Admitting: *Deleted

## 2012-09-06 ENCOUNTER — Ambulatory Visit (INDEPENDENT_AMBULATORY_CARE_PROVIDER_SITE_OTHER): Payer: Medicare PPO | Admitting: Thoracic Surgery (Cardiothoracic Vascular Surgery)

## 2012-09-06 VITALS — BP 138/66 | HR 59 | Resp 16 | Ht 71.0 in | Wt 144.0 lb

## 2012-09-06 DIAGNOSIS — Z9889 Other specified postprocedural states: Secondary | ICD-10-CM

## 2012-09-06 DIAGNOSIS — R918 Other nonspecific abnormal finding of lung field: Secondary | ICD-10-CM

## 2012-09-06 DIAGNOSIS — D381 Neoplasm of uncertain behavior of trachea, bronchus and lung: Secondary | ICD-10-CM

## 2012-09-06 NOTE — Assessment & Plan Note (Signed)
Right upper lobe lung mass with central calcification likely a malignancy until proven otherwise Pulmonary function stable at this time and the patient is ready to undergo thoracic surgical intervention Note recent negative needle biopsy should not deter resection of this mass which appears malignant on PET scan The patient had multiple questions regarding the advisability of surgery and appeared of answered all of his questions to his satisfaction. The patient verbalizes understanding and he needs to proceed with surgical approach first with Dr. Dorris Fetch Pulmonary will be available as needed during the

## 2012-09-06 NOTE — Progress Notes (Signed)
Patient ID: Tony Prince, male   DOB: Mar 22, 1933, 79 y.o.   MRN: [id number] Tony Prince returns today to further discuss treatment of his right upper lobe mass.  He is a 79 year old gentleman with a history of pipe smoking in the past but no cigarette smoking. He recently had presented with hemoptysis. Chest x-ray showed opacity at the right apex. CT scan showed a mass in the area of old scarring at the right apex adjacent to the chest wall. We did a needle biopsy that which showed inflammatory changes. We did discuss that this does not rule out lung cancer, because of the possibility of a false negative needle biopsy.  In the interim since his last visit he had a stress test was seen by Dr. Eldridge Dace. His stress test was normal and he is clear from a cardiac standpoint to undergo surgery.  Once again reviewed that we recommended resection of this new lung mass. We would proceed with a right thoracoscopy, wedge resection, possible lobectomy and possible chest wall resection depending on intraoperative findings. He understands the nature of the procedure and the intraoperative decision making. He has concerns about arm and shoulder function after the procedure. I reassured him that this should not be an issue although if we do have to do a chest wall resection he may be somewhat limited to pain for some time.  His pulmonary function is adequate to tolerate the procedure an FEV1 of 80% normal and a DLCO of 55% normal.  Again discussed the indications, risks, benefits, and alternatives. He understands that the risks include but are not limited to death, MI, DVT, PE, bleeding, possible need for transfusion, infection, prolonged air leak, pleural effusions, cardiac arrhythmias, as well as the possibility of unforeseeable complications.  He understands and accepts the risk of the procedure and wishes to proceed.  Plan right VATS, wedge resection, possible lobectomy, possible chest wall resection on  Wednesday, June 25.

## 2012-09-10 NOTE — Pre-Procedure Instructions (Signed)
Tony Prince  09/10/2012   Your procedure is scheduled on:  Wednesday June 25th  Report to [facility] at 6:30 AM.  Call this number if you have problems the morning of surgery: [phone]   Remember:   Do not eat food or drink liquids after midnight.   Take these medicines the morning of surgery with A SIP OF WATER: None   Do not wear jewelry  Do not wear lotions, powders, or perfumes. You may wear deodorant.  Do not shave 48 hours prior to surgery. Men may shave face and neck.  Do not bring valuables to the hospital.  [facility] is not responsible for any belongings or valuables.  Contacts, dentures or bridgework may not be worn into surgery.  Leave suitcase in the car. After surgery it may be brought to your room.  For patients admitted to the hospital, checkout time is 11:00 AM the day of  discharge.    Special Instructions: Incentive Spirometry - Practice and bring it with you on the day of surgery. Shower using CHG 2 nights before surgery and the night before surgery.  If you shower the day of surgery use CHG.  Use special wash - you have one bottle of CHG for all showers.  You should use approximately 1/3 of the bottle for each shower.   Please read over the following fact sheets that you were given: Pain Booklet, Coughing and Deep Breathing, Blood Transfusion Information, MRSA Information and Surgical Site Infection Prevention

## 2012-09-12 ENCOUNTER — Encounter (HOSPITAL_COMMUNITY)
Admission: RE | Admit: 2012-09-12 | Discharge: 2012-09-12 | Disposition: A | Discharge status: Home / Self Care | Payer: Medicare PPO | Source: Ambulatory Visit | Attending: Thoracic Surgery (Cardiothoracic Vascular Surgery) | Admitting: Thoracic Surgery (Cardiothoracic Vascular Surgery)

## 2012-09-12 ENCOUNTER — Encounter (HOSPITAL_COMMUNITY): Payer: Self-pay

## 2012-09-12 VITALS — BP 117/73 | HR 56 | Temp 97.5°F | Resp 18 | Ht 71.0 in | Wt 144.2 lb

## 2012-09-12 DIAGNOSIS — R918 Other nonspecific abnormal finding of lung field: Secondary | ICD-10-CM

## 2012-09-12 HISTORY — DX: Cardiac murmur, unspecified: R01.1

## 2012-09-12 HISTORY — DX: Unspecified osteoarthritis, unspecified site: M19.90

## 2012-09-12 LAB — COMPREHENSIVE METABOLIC PANEL
ALT: 13 U/L (ref 0–53)
AST: 20 U/L (ref 0–37)
Albumin: 3.5 g/dL (ref 3.5–5.2)
Alkaline Phosphatase: 84 U/L (ref 39–117)
BUN: 29 mg/dL — ABNORMAL HIGH (ref 6–23)
Chloride: 103 mEq/L (ref 96–112)
Potassium: 4 mEq/L (ref 3.5–5.1)
Sodium: 138 mEq/L (ref 135–145)
Total Bilirubin: 0.4 mg/dL (ref 0.3–1.2)
Total Protein: 6.8 g/dL (ref 6.0–8.3)

## 2012-09-12 LAB — URINALYSIS, ROUTINE W REFLEX MICROSCOPIC
Hgb urine dipstick: NEGATIVE
Nitrite: NEGATIVE
Protein, ur: NEGATIVE mg/dL
Specific Gravity, Urine: 1.022 (ref 1.005–1.030)
Urobilinogen, UA: 1 mg/dL (ref 0.0–1.0)

## 2012-09-12 LAB — CBC
HCT: 39.6 % (ref 39.0–52.0)
Platelets: 134 10*3/uL — ABNORMAL LOW (ref 150–400)
RDW: 14.1 % (ref 11.5–15.5)
WBC: 8.2 10*3/uL (ref 4.0–10.5)

## 2012-09-12 LAB — BLOOD GAS, ARTERIAL
TCO2: 26.6 mmol/L (ref 0–100)
pCO2 arterial: 37.4 mmHg (ref 35.0–45.0)
pH, Arterial: 7.447 (ref 7.350–7.450)

## 2012-09-12 LAB — SURGICAL PCR SCREEN
MRSA, PCR: NEGATIVE
Staphylococcus aureus: NEGATIVE

## 2012-09-12 LAB — APTT: aPTT: 38 seconds — ABNORMAL HIGH (ref 24–37)

## 2012-09-12 NOTE — Progress Notes (Signed)
Request made & rec'd  To/from  Dr. Hoyle Barr office for records.

## 2012-09-13 MED ORDER — DEXTROSE 5 % IV SOLN
1.5000 g | INTRAVENOUS | Status: AC
  Administered 2012-09-14: 1.5 g via INTRAVENOUS
  Filled 2012-09-13: qty 1.5

## 2012-09-14 ENCOUNTER — Encounter (HOSPITAL_COMMUNITY): Payer: Self-pay | Admitting: *Deleted

## 2012-09-14 ENCOUNTER — Inpatient Hospital Stay (HOSPITAL_COMMUNITY): Payer: Medicare PPO | Admitting: Critical Care Medicine

## 2012-09-14 ENCOUNTER — Encounter (HOSPITAL_COMMUNITY): Payer: Self-pay | Admitting: Critical Care Medicine

## 2012-09-14 ENCOUNTER — Encounter (HOSPITAL_COMMUNITY)
Admission: RE | Disposition: A | Discharge status: Home / Self Care | Payer: Self-pay | Source: Ambulatory Visit | Attending: Thoracic Surgery (Cardiothoracic Vascular Surgery)

## 2012-09-14 ENCOUNTER — Inpatient Hospital Stay (HOSPITAL_COMMUNITY): Payer: Medicare PPO

## 2012-09-14 ENCOUNTER — Inpatient Hospital Stay (HOSPITAL_COMMUNITY)
Admission: RE | Admit: 2012-09-14 | Discharge: 2012-09-19 | DRG: 163 | Disposition: A | Discharge status: Home / Self Care | Payer: Medicare PPO | Source: Ambulatory Visit | Attending: Thoracic Surgery (Cardiothoracic Vascular Surgery) | Admitting: Thoracic Surgery (Cardiothoracic Vascular Surgery)

## 2012-09-14 DIAGNOSIS — J850 Gangrene and necrosis of lung: Secondary | ICD-10-CM | POA: Diagnosis present

## 2012-09-14 DIAGNOSIS — F17200 Nicotine dependence, unspecified, uncomplicated: Secondary | ICD-10-CM | POA: Diagnosis present

## 2012-09-14 DIAGNOSIS — I498 Other specified cardiac arrhythmias: Secondary | ICD-10-CM | POA: Diagnosis present

## 2012-09-14 DIAGNOSIS — C3410 Malignant neoplasm of upper lobe, unspecified bronchus or lung: Principal | ICD-10-CM | POA: Diagnosis present

## 2012-09-14 DIAGNOSIS — R0902 Hypoxemia: Secondary | ICD-10-CM

## 2012-09-14 DIAGNOSIS — D696 Thrombocytopenia, unspecified: Secondary | ICD-10-CM | POA: Diagnosis not present

## 2012-09-14 DIAGNOSIS — Z9889 Other specified postprocedural states: Secondary | ICD-10-CM

## 2012-09-14 DIAGNOSIS — R222 Localized swelling, mass and lump, trunk: Secondary | ICD-10-CM

## 2012-09-14 DIAGNOSIS — J441 Chronic obstructive pulmonary disease with (acute) exacerbation: Secondary | ICD-10-CM

## 2012-09-14 DIAGNOSIS — F411 Generalized anxiety disorder: Secondary | ICD-10-CM | POA: Diagnosis present

## 2012-09-14 DIAGNOSIS — Z902 Acquired absence of lung [part of]: Secondary | ICD-10-CM

## 2012-09-14 DIAGNOSIS — J9382 Other air leak: Secondary | ICD-10-CM | POA: Diagnosis not present

## 2012-09-14 DIAGNOSIS — D62 Acute posthemorrhagic anemia: Secondary | ICD-10-CM | POA: Diagnosis not present

## 2012-09-14 DIAGNOSIS — R918 Other nonspecific abnormal finding of lung field: Secondary | ICD-10-CM

## 2012-09-14 DIAGNOSIS — J95821 Acute postprocedural respiratory failure: Secondary | ICD-10-CM | POA: Diagnosis not present

## 2012-09-14 DIAGNOSIS — L408 Other psoriasis: Secondary | ICD-10-CM | POA: Diagnosis present

## 2012-09-14 HISTORY — DX: Shortness of breath: R06.02

## 2012-09-14 HISTORY — PX: VIDEO ASSISTED THORACOSCOPY (VATS)/WEDGE RESECTION: SHX6174

## 2012-09-14 HISTORY — PX: LOBECTOMY: SHX5089

## 2012-09-14 HISTORY — PX: THORACOTOMY/LOBECTOMY: SHX6116

## 2012-09-14 SURGERY — VIDEO ASSISTED THORACOSCOPY (VATS)/WEDGE RESECTION
Anesthesia: General | Site: Chest | Laterality: Right | Wound class: Clean Contaminated

## 2012-09-14 MED ORDER — 0.9 % SODIUM CHLORIDE (POUR BTL) OPTIME
TOPICAL | Status: DC | PRN
  Administered 2012-09-14: 2000 mL

## 2012-09-14 MED ORDER — SENNOSIDES-DOCUSATE SODIUM 8.6-50 MG PO TABS
1.0000 | ORAL_TABLET | Freq: Every evening | ORAL | Status: DC | PRN
  Filled 2012-09-14: qty 1

## 2012-09-14 MED ORDER — LORAZEPAM 0.5 MG PO TABS: 0.5000 mg | ORAL_TABLET | Freq: Every day | ORAL | Status: DC

## 2012-09-14 MED ORDER — DEXTROSE 5 % IV SOLN
1.5000 g | Freq: Two times a day (BID) | INTRAVENOUS | Status: AC
  Administered 2012-09-15: 1.5 g via INTRAVENOUS
  Filled 2012-09-14: qty 1.5

## 2012-09-14 MED ORDER — PROPOFOL 10 MG/ML IV BOLUS
INTRAVENOUS | Status: DC | PRN
  Administered 2012-09-14: 120 mg via INTRAVENOUS

## 2012-09-14 MED ORDER — POLYVINYL ALCOHOL 1.4 % OP SOLN
1.0000 [drp] | Freq: Two times a day (BID) | OPHTHALMIC | Status: DC | PRN
  Filled 2012-09-14: qty 15

## 2012-09-14 MED ORDER — DEXTROSE 5 % IV SOLN
1.5000 g | Freq: Two times a day (BID) | INTRAVENOUS | Status: DC
  Administered 2012-09-14: 1.5 g via INTRAVENOUS
  Filled 2012-09-14 (×2): qty 1.5

## 2012-09-14 MED ORDER — MIDAZOLAM HCL 5 MG/5ML IJ SOLN
INTRAMUSCULAR | Status: DC | PRN
  Administered 2012-09-14: 2 mg via INTRAVENOUS

## 2012-09-14 MED ORDER — ONDANSETRON HCL 4 MG/2ML IJ SOLN
INTRAMUSCULAR | Status: DC | PRN
  Administered 2012-09-14: 4 mg via INTRAVENOUS

## 2012-09-14 MED ORDER — ACETAMINOPHEN 10 MG/ML IV SOLN
1000.0000 mg | Freq: Four times a day (QID) | INTRAVENOUS | Status: AC
  Administered 2012-09-14 – 2012-09-15 (×4): 1000 mg via INTRAVENOUS
  Filled 2012-09-14 (×5): qty 100

## 2012-09-14 MED ORDER — PROMETHAZINE HCL 25 MG/ML IJ SOLN
12.5000 mg | INTRAMUSCULAR | Status: DC | PRN
  Administered 2012-09-14: 12.5 mg via INTRAVENOUS
  Filled 2012-09-14: qty 1

## 2012-09-14 MED ORDER — OMEGA-3-ACID ETHYL ESTERS 1 G PO CAPS
2.0000 g | ORAL_CAPSULE | Freq: Every day | ORAL | Status: DC
  Administered 2012-09-15 – 2012-09-19 (×5): 2 g via ORAL
  Filled 2012-09-14 (×5): qty 2

## 2012-09-14 MED ORDER — HYDROMORPHONE HCL PF 1 MG/ML IJ SOLN
0.2500 mg | INTRAMUSCULAR | Status: DC | PRN
  Administered 2012-09-14 (×4): 0.5 mg via INTRAVENOUS

## 2012-09-14 MED ORDER — HYDROMORPHONE HCL PF 1 MG/ML IJ SOLN
INTRAMUSCULAR | Status: AC
  Filled 2012-09-14: qty 1

## 2012-09-14 MED ORDER — ROCURONIUM BROMIDE 100 MG/10ML IV SOLN
INTRAVENOUS | Status: DC | PRN
  Administered 2012-09-14: 50 mg via INTRAVENOUS

## 2012-09-14 MED ORDER — POTASSIUM CHLORIDE 10 MEQ/50ML IV SOLN: 10.0000 meq | Freq: Every day | INTRAVENOUS | Status: DC | PRN

## 2012-09-14 MED ORDER — PANTOPRAZOLE SODIUM 40 MG PO TBEC
40.0000 mg | DELAYED_RELEASE_TABLET | Freq: Every day | ORAL | Status: DC
  Administered 2012-09-15 – 2012-09-19 (×5): 40 mg via ORAL
  Filled 2012-09-14 (×5): qty 1

## 2012-09-14 MED ORDER — NEOSTIGMINE METHYLSULFATE 1 MG/ML IJ SOLN
INTRAMUSCULAR | Status: DC | PRN
  Administered 2012-09-14: 3 mg via INTRAVENOUS

## 2012-09-14 MED ORDER — OXYCODONE HCL 5 MG PO TABS
5.0000 mg | ORAL_TABLET | ORAL | Status: AC | PRN
  Administered 2012-09-14: 5 mg via ORAL
  Administered 2012-09-14: 10 mg via ORAL
  Filled 2012-09-14: qty 1

## 2012-09-14 MED ORDER — ARTIFICIAL TEARS OP OINT
TOPICAL_OINTMENT | OPHTHALMIC | Status: DC | PRN
  Administered 2012-09-14: 1 via OPHTHALMIC

## 2012-09-14 MED ORDER — OXYCODONE HCL 5 MG/5ML PO SOLN: 5.0000 mg | Freq: Once | ORAL | Status: DC | PRN

## 2012-09-14 MED ORDER — LACTATED RINGERS IV SOLN
INTRAVENOUS | Status: DC | PRN
  Administered 2012-09-14 (×3): via INTRAVENOUS

## 2012-09-14 MED ORDER — OXYCODONE HCL 5 MG PO TABS: 5.0000 mg | ORAL_TABLET | Freq: Once | ORAL | Status: DC | PRN

## 2012-09-14 MED ORDER — POLYETHYL GLYCOL-PROPYL GLYCOL 0.4-0.3 % OP SOLN: 1.0000 [drp] | Freq: Two times a day (BID) | OPHTHALMIC | Status: DC | PRN

## 2012-09-14 MED ORDER — HEMOSTATIC AGENTS (NO CHARGE) OPTIME
TOPICAL | Status: DC | PRN
  Administered 2012-09-14: 1 via TOPICAL

## 2012-09-14 MED ORDER — FENTANYL 10 MCG/ML IV SOLN
INTRAVENOUS | Status: DC
  Administered 2012-09-14: 14:00:00 via INTRAVENOUS
  Administered 2012-09-14: 80 ug via INTRAVENOUS
  Administered 2012-09-14: 240 ug via INTRAVENOUS
  Administered 2012-09-14: 23:00:00 via INTRAVENOUS
  Administered 2012-09-15: 20 ug via INTRAVENOUS
  Administered 2012-09-15: 80 ug via INTRAVENOUS
  Administered 2012-09-15: 130 ug via INTRAVENOUS
  Administered 2012-09-15: 290 ug via INTRAVENOUS
  Administered 2012-09-15: 70 ug via INTRAVENOUS
  Administered 2012-09-15: 20 ug via INTRAVENOUS
  Administered 2012-09-15: 20:00:00 via INTRAVENOUS
  Administered 2012-09-16: 130 ug via INTRAVENOUS
  Administered 2012-09-16: 180 ug via INTRAVENOUS
  Administered 2012-09-16: 190 ug via INTRAVENOUS
  Filled 2012-09-14 (×4): qty 50

## 2012-09-14 MED ORDER — FENTANYL CITRATE 0.05 MG/ML IJ SOLN
INTRAMUSCULAR | Status: DC | PRN
  Administered 2012-09-14 (×2): 50 ug via INTRAVENOUS
  Administered 2012-09-14 (×2): 100 ug via INTRAVENOUS
  Administered 2012-09-14 (×2): 50 ug via INTRAVENOUS

## 2012-09-14 MED ORDER — ONDANSETRON HCL 4 MG/2ML IJ SOLN
4.0000 mg | Freq: Four times a day (QID) | INTRAMUSCULAR | Status: DC | PRN
  Administered 2012-09-16 (×2): 4 mg via INTRAVENOUS
  Filled 2012-09-14 (×2): qty 2

## 2012-09-14 MED ORDER — DIPHENHYDRAMINE HCL 50 MG/ML IJ SOLN
12.5000 mg | Freq: Four times a day (QID) | INTRAMUSCULAR | Status: DC | PRN
  Administered 2012-09-16: 12.5 mg via INTRAVENOUS
  Filled 2012-09-14: qty 1

## 2012-09-14 MED ORDER — VECURONIUM BROMIDE 10 MG IV SOLR
INTRAVENOUS | Status: DC | PRN
  Administered 2012-09-14 (×5): 1 mg via INTRAVENOUS

## 2012-09-14 MED ORDER — OXYCODONE HCL 5 MG PO TABS
ORAL_TABLET | ORAL | Status: AC
  Filled 2012-09-14: qty 2

## 2012-09-14 MED ORDER — DIPHENHYDRAMINE HCL 12.5 MG/5ML PO ELIX
12.5000 mg | ORAL_SOLUTION | Freq: Four times a day (QID) | ORAL | Status: DC | PRN
  Filled 2012-09-14: qty 5

## 2012-09-14 MED ORDER — GLYCOPYRROLATE 0.2 MG/ML IJ SOLN
INTRAMUSCULAR | Status: DC | PRN
  Administered 2012-09-14: 0.4 mg via INTRAVENOUS

## 2012-09-14 MED ORDER — SODIUM CHLORIDE 0.9 % IJ SOLN: 9.0000 mL | INTRAMUSCULAR | Status: DC | PRN

## 2012-09-14 MED ORDER — COLLOIDAL OATMEAL 1 % EX CREA: 1.0000 "application " | TOPICAL_CREAM | Freq: Every day | CUTANEOUS | Status: DC | PRN

## 2012-09-14 MED ORDER — BISACODYL 5 MG PO TBEC
10.0000 mg | DELAYED_RELEASE_TABLET | Freq: Every day | ORAL | Status: DC
  Administered 2012-09-14 – 2012-09-19 (×6): 10 mg via ORAL
  Filled 2012-09-14: qty 1
  Filled 2012-09-14: qty 2
  Filled 2012-09-14: qty 1
  Filled 2012-09-14 (×2): qty 2
  Filled 2012-09-14: qty 1
  Filled 2012-09-14: qty 2

## 2012-09-14 MED ORDER — OMEGA-3 FATTY ACIDS 1000 MG PO CAPS: 2.0000 g | ORAL_CAPSULE | Freq: Every day | ORAL | Status: DC

## 2012-09-14 MED ORDER — NALOXONE HCL 0.4 MG/ML IJ SOLN: 0.4000 mg | INTRAMUSCULAR | Status: DC | PRN

## 2012-09-14 MED ORDER — KCL IN DEXTROSE-NACL 20-5-0.9 MEQ/L-%-% IV SOLN
INTRAVENOUS | Status: DC
  Administered 2012-09-14 – 2012-09-15 (×2): via INTRAVENOUS
  Administered 2012-09-15: 100 mL/h via INTRAVENOUS
  Filled 2012-09-14 (×7): qty 1000

## 2012-09-14 MED ORDER — ONDANSETRON HCL 4 MG/2ML IJ SOLN: 4.0000 mg | Freq: Four times a day (QID) | INTRAMUSCULAR | Status: DC | PRN

## 2012-09-14 MED ORDER — OXYCODONE-ACETAMINOPHEN 5-325 MG PO TABS
1.0000 | ORAL_TABLET | ORAL | Status: DC | PRN
  Administered 2012-09-15 – 2012-09-17 (×6): 1 via ORAL
  Administered 2012-09-18 (×2): 2 via ORAL
  Administered 2012-09-18: 1 via ORAL
  Administered 2012-09-18: 2 via ORAL
  Administered 2012-09-19 (×3): 1 via ORAL
  Filled 2012-09-14 (×2): qty 1
  Filled 2012-09-14 (×3): qty 2
  Filled 2012-09-14 (×4): qty 1
  Filled 2012-09-14: qty 2
  Filled 2012-09-14 (×3): qty 1

## 2012-09-14 MED ORDER — TRAMADOL HCL 50 MG PO TABS
50.0000 mg | ORAL_TABLET | Freq: Four times a day (QID) | ORAL | Status: DC | PRN
  Administered 2012-09-14 – 2012-09-15 (×2): 100 mg via ORAL
  Administered 2012-09-18 (×2): 50 mg via ORAL
  Administered 2012-09-18: 100 mg via ORAL
  Administered 2012-09-19 (×3): 50 mg via ORAL
  Filled 2012-09-14 (×3): qty 1
  Filled 2012-09-14 (×2): qty 2
  Filled 2012-09-14: qty 1
  Filled 2012-09-14: qty 2
  Filled 2012-09-14: qty 1

## 2012-09-14 MED ORDER — EPHEDRINE SULFATE 50 MG/ML IJ SOLN
INTRAMUSCULAR | Status: DC | PRN
  Administered 2012-09-14 (×3): 5 mg via INTRAVENOUS

## 2012-09-14 MED ORDER — LORAZEPAM 0.5 MG PO TABS
0.5000 mg | ORAL_TABLET | Freq: Every day | ORAL | Status: DC
  Administered 2012-09-14 – 2012-09-18 (×5): 0.5 mg via ORAL
  Filled 2012-09-14 (×6): qty 1

## 2012-09-14 SURGICAL SUPPLY — 80 items
BENZOIN TINCTURE PRP APPL 2/3 (GAUZE/BANDAGES/DRESSINGS) ×3 IMPLANT
CANISTER SUCTION 2500CC (MISCELLANEOUS) ×6 IMPLANT
CATH KIT ON Q 5IN SLV (PAIN MANAGEMENT) IMPLANT
CATH THORACIC 28FR (CATHETERS) IMPLANT
CATH THORACIC 36FR (CATHETERS) IMPLANT
CATH THORACIC 36FR RT ANG (CATHETERS) IMPLANT
CLIP TI MEDIUM 6 (CLIP) ×9 IMPLANT
CLOTH BEACON ORANGE TIMEOUT ST (SAFETY) ×3 IMPLANT
CONT SPEC 4OZ CLIKSEAL STRL BL (MISCELLANEOUS) ×30 IMPLANT
CONT SPEC STER OR (MISCELLANEOUS) ×24 IMPLANT
DERMABOND ADVANCED (GAUZE/BANDAGES/DRESSINGS) ×1
DERMABOND ADVANCED .7 DNX12 (GAUZE/BANDAGES/DRESSINGS) ×2 IMPLANT
DRAIN CHANNEL 28F RND 3/8 FF (WOUND CARE) IMPLANT
DRAIN CHANNEL 32F RND 10.7 FF (WOUND CARE) IMPLANT
DRAPE LAPAROSCOPIC ABDOMINAL (DRAPES) ×3 IMPLANT
DRAPE SLUSH/WARMER DISC (DRAPES) ×3 IMPLANT
DRAPE WARM FLUID 44X44 (DRAPE) IMPLANT
ELECT REM PT RETURN 9FT ADLT (ELECTROSURGICAL) ×3
ELECTRODE REM PT RTRN 9FT ADLT (ELECTROSURGICAL) ×2 IMPLANT
GLOVE BIOGEL PI IND STRL 6 (GLOVE) ×2 IMPLANT
GLOVE BIOGEL PI IND STRL 7.0 (GLOVE) ×10 IMPLANT
GLOVE BIOGEL PI INDICATOR 6 (GLOVE) ×1
GLOVE BIOGEL PI INDICATOR 7.0 (GLOVE) ×5
GLOVE EUDERMIC 7 POWDERFREE (GLOVE) ×9 IMPLANT
GOWN PREVENTION PLUS XLARGE (GOWN DISPOSABLE) ×6 IMPLANT
GOWN STRL NON-REIN LRG LVL3 (GOWN DISPOSABLE) ×9 IMPLANT
HANDLE STAPLE ENDO GIA SHORT (STAPLE) ×1
HEMOSTAT SURGICEL 2X14 (HEMOSTASIS) ×3 IMPLANT
KIT BASIN OR (CUSTOM PROCEDURE TRAY) ×3 IMPLANT
KIT ROOM TURNOVER OR (KITS) ×3 IMPLANT
KIT SUCTION CATH 14FR (SUCTIONS) ×3 IMPLANT
NEEDLE BIOPSY 14X6 SOFT TISS (NEEDLE) ×3 IMPLANT
NS IRRIG 1000ML POUR BTL (IV SOLUTION) ×6 IMPLANT
PACK CHEST (CUSTOM PROCEDURE TRAY) ×3 IMPLANT
PAD ARMBOARD 7.5X6 YLW CONV (MISCELLANEOUS) ×6 IMPLANT
PENCIL BUTTON HOLSTER BLD 10FT (ELECTRODE) ×3 IMPLANT
POUCH ENDO CATCH II 15MM (MISCELLANEOUS) ×3 IMPLANT
POUCH SPECIMEN RETRIEVAL 10MM (ENDOMECHANICALS) ×3 IMPLANT
RELOAD EGIA 45 MED/THCK PURPLE (STAPLE) ×6 IMPLANT
RELOAD EGIA 60 MED/THCK PURPLE (STAPLE) ×6 IMPLANT
RELOAD EGIA TRIS TAN 45 CVD (STAPLE) ×6 IMPLANT
RELOAD TRI 2.0 60 XTHK VAS SUL (STAPLE) ×3 IMPLANT
SCISSORS LAP 5X35 DISP (ENDOMECHANICALS) ×3 IMPLANT
SEALANT PROGEL (MISCELLANEOUS) IMPLANT
SEALANT SURG COSEAL 4ML (VASCULAR PRODUCTS) IMPLANT
SEALANT SURG COSEAL 8ML (VASCULAR PRODUCTS) IMPLANT
SOLUTION ANTI FOG 6CC (MISCELLANEOUS) ×3 IMPLANT
SPECIMEN JAR MEDIUM (MISCELLANEOUS) ×3 IMPLANT
SPONGE GAUZE 4X4 12PLY (GAUZE/BANDAGES/DRESSINGS) ×3 IMPLANT
STAPLER ENDO GIA 12MM SHORT (STAPLE) ×2 IMPLANT
SUT PROLENE 4 0 RB 1 (SUTURE)
SUT PROLENE 4-0 RB1 .5 CRCL 36 (SUTURE) IMPLANT
SUT SILK  1 MH (SUTURE) ×3
SUT SILK 1 MH (SUTURE) ×6 IMPLANT
SUT SILK 2 0SH CR/8 30 (SUTURE) IMPLANT
SUT SILK 3 0SH CR/8 30 (SUTURE) IMPLANT
SUT VIC AB 0 CTX 27 (SUTURE) IMPLANT
SUT VIC AB 1 CTX 27 (SUTURE) ×6 IMPLANT
SUT VIC AB 2-0 CT1 27 (SUTURE)
SUT VIC AB 2-0 CT1 TAPERPNT 27 (SUTURE) IMPLANT
SUT VIC AB 2-0 CTX 36 (SUTURE) ×3 IMPLANT
SUT VIC AB 2-0 UR6 27 (SUTURE) ×3 IMPLANT
SUT VIC AB 3-0 MH 27 (SUTURE) IMPLANT
SUT VIC AB 3-0 SH 27 (SUTURE) ×3
SUT VIC AB 3-0 SH 27X BRD (SUTURE) ×6 IMPLANT
SUT VIC AB 3-0 X1 27 (SUTURE) ×6 IMPLANT
SUT VICRYL 0 UR6 27IN ABS (SUTURE) IMPLANT
SUT VICRYL 2 TP 1 (SUTURE) ×3 IMPLANT
SWAB COLLECTION DEVICE MRSA (MISCELLANEOUS) IMPLANT
SYSTEM SAHARA CHEST DRAIN ATS (WOUND CARE) ×3 IMPLANT
TAPE CLOTH SURG 4X10 WHT LF (GAUZE/BANDAGES/DRESSINGS) ×3 IMPLANT
TAPE UMBILICAL COTTON 1/8X30 (MISCELLANEOUS) ×3 IMPLANT
TIP APPLICATOR SPRAY EXTEND 16 (VASCULAR PRODUCTS) IMPLANT
TOWEL OR 17X24 6PK STRL BLUE (TOWEL DISPOSABLE) ×3 IMPLANT
TOWEL OR 17X26 10 PK STRL BLUE (TOWEL DISPOSABLE) ×6 IMPLANT
TRAP SPECIMEN MUCOUS 40CC (MISCELLANEOUS) IMPLANT
TRAY FOLEY CATH 14FRSI W/METER (CATHETERS) ×3 IMPLANT
TUBE ANAEROBIC SPECIMEN COL (MISCELLANEOUS) IMPLANT
TUNNELER SHEATH ON-Q 16GX12 DP (PAIN MANAGEMENT) IMPLANT
WATER STERILE IRR 1000ML POUR (IV SOLUTION) ×6 IMPLANT

## 2012-09-14 NOTE — Preoperative (Signed)
Beta Blockers   Reason not to administer Beta Blockers:Not Applicable, pt not on beta blocker 

## 2012-09-14 NOTE — Transfer of Care (Signed)
Immediate Anesthesia Transfer of Care Note  Patient: Tony Prince  Procedure(s) Performed: Procedure(s) with comments: VIDEO ASSISTED THORACOSCOPY (VATS)/WEDGE RESECTION (Right) - possible chest wall resection LOBECTOMY (Right) THORACOTOMY/LOBECTOMY (Right)  Patient Location: PACU  Anesthesia Type:General  Level of Consciousness: responds to stimulation  Airway & Oxygen Therapy: Patient Spontanous Breathing and Patient connected to nasal cannula oxygen  Post-op Assessment: Report given to PACU RN and Post -op Vital signs reviewed and stable  Post vital signs: Reviewed and stable  Complications: No apparent anesthesia complications

## 2012-09-14 NOTE — H&P (View-Only) (Signed)
Patient ID: Tony Prince, male   DOB: [birth year], 79 y.o.   MRN: [id number] Tony Prince returns today to further discuss treatment of his right upper lobe mass.  He is a 79 year old gentleman with a history of pipe smoking in the past but no cigarette smoking. He recently had presented with hemoptysis. Chest x-ray showed opacity at the right apex. CT scan showed a mass in the area of old scarring at the right apex adjacent to the chest wall. We did a needle biopsy that which showed inflammatory changes. We did discuss that this does not rule out lung cancer, because of the possibility of a false negative needle biopsy.  In the interim since his last visit he had a stress test was seen by Dr. Eldridge Dace. His stress test was normal and he is clear from a cardiac standpoint to undergo surgery.  Once again reviewed that we recommended resection of this new lung mass. We would proceed with a right thoracoscopy, wedge resection, possible lobectomy and possible chest wall resection depending on intraoperative findings. He understands the nature of the procedure and the intraoperative decision making. He has concerns about arm and shoulder function after the procedure. I reassured him that this should not be an issue although if we do have to do a chest wall resection he may be somewhat limited to pain for some time.  His pulmonary function is adequate to tolerate the procedure an FEV1 of 80% normal and a DLCO of 55% normal.  Again discussed the indications, risks, benefits, and alternatives. He understands that the risks include but are not limited to death, MI, DVT, PE, bleeding, possible need for transfusion, infection, prolonged air leak, pleural effusions, cardiac arrhythmias, as well as the possibility of unforeseeable complications.  He understands and accepts the risk of the procedure and wishes to proceed.  Plan right VATS, wedge resection, possible lobectomy, possible chest wall resection on  Wednesday, June 25.

## 2012-09-14 NOTE — Anesthesia Preprocedure Evaluation (Addendum)
Anesthesia Evaluation  Patient identified by MRN, date of birth, ID band Patient awake    Reviewed: Allergy & Precautions, H&P , NPO status , Patient's Chart, lab work & pertinent test results  Airway Mallampati: II  Neck ROM: full    Dental  (+) Dental Advisory Given and Teeth Intact   Pulmonary shortness of breath, former smoker,  Right upper lobe mass         Cardiovascular + CAD + Valvular Problems/Murmurs MVP  Stress test 6/14 - pt reports WNL   Neuro/Psych  Headaches, PSYCHIATRIC DISORDERS Anxiety    GI/Hepatic GERD-  ,  Endo/Other    Renal/GU      Musculoskeletal   Abdominal   Peds  Hematology   Anesthesia Other Findings   Reproductive/Obstetrics                        Anesthesia Physical Anesthesia Plan  ASA: III  Anesthesia Plan: General   Post-op Pain Management:    Induction: Intravenous  Airway Management Planned: Double Lumen EBT  Additional Equipment: Arterial line and CVP  Intra-op Plan:   Post-operative Plan: Extubation in OR  Informed Consent: I have reviewed the patients History and Physical, chart, labs and discussed the procedure including the risks, benefits and alternatives for the proposed anesthesia with the patient or authorized representative who has indicated his/her understanding and acceptance.   Dental advisory given  Plan Discussed with: Anesthesiologist and Surgeon  Anesthesia Plan Comments:       Anesthesia Quick Evaluation

## 2012-09-14 NOTE — Anesthesia Procedure Notes (Signed)
Procedure Name: Intubation Date/Time: 09/14/2012 8:28 AM Performed by: Elon Alas Pre-anesthesia Checklist: Patient identified, Timeout performed, Emergency Drugs available, Suction available and Patient being monitored Patient Re-evaluated:Patient Re-evaluated prior to inductionOxygen Delivery Method: Circle system utilized Preoxygenation: Pre-oxygenation with 100% oxygen Intubation Type: IV induction Ventilation: Mask ventilation without difficulty Endobronchial tube: Left and Double lumen EBT and 39 Fr Placement Confirmation: positive ETCO2 and breath sounds checked- equal and bilateral Secured at: 31 cm Tube secured with: Tape Dental Injury: Teeth and Oropharynx as per pre-operative assessment

## 2012-09-14 NOTE — Progress Notes (Signed)
T. CTS p.m. Rounds  Status post right upper lobectomy with chest wall resection Pulmonary status stable oxygen saturation greater than 95% Mild air leak with minimal chest tube drainage Sinus rhythm stable blood pressure

## 2012-09-14 NOTE — Brief Op Note (Addendum)
09/14/2012  12:30 PM  PATIENT:  Tony Prince  79 y.o. 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 Marland Kitchen   COUNTS CORRECT: YES  PLAN OF CARE: Admit to inpatient   PATIENT DISPOSITION:  PACU - hemodynamically stable.   Delay start of Pharmacological VTE agent (>24hrs) due to surgical blood loss or risk of bleeding: yes

## 2012-09-14 NOTE — Consult Note (Signed)
PULMONARY  / CRITICAL CARE MEDICINE  Name: Tony Prince MRN: [id number] DOB: 13-Nov-1932    ADMISSION DATE:  09/14/2012 CONSULTATION DATE:  6/25   REFERRING MD :  Dr. Dorris Fetch PRIMARY SERVICE: CVTS  CHIEF COMPLAINT:  Post-Op Respiratory Failure  BRIEF PATIENT DESCRIPTION: 79 y/o M with known lung mass admitted on 6/25 for planned VATS, wedge resection and R lobectomy.  PCCM consulted for post op respiratory failure.   SIGNIFICANT EVENTS / STUDIES:  6/25 - Admit for R VATS, wedge resection and lobectomy.     LINES / TUBES: R CT x2 6/25>>> A Line 6/25>>>  CULTURES / PATHOLOGY R Apical Bx 6/25>>>  ANTIBIOTICS: Zinacef 6/25>>>  HISTORY OF PRESENT ILLNESS: 79 y/o M with a PMH of Mitral Valve Prolapse, GERD, CAD, Anxiety, inguinal / hiatal hernia, and known lung mass admitted on 6/25 for planned VATS, wedge resection and R lobectomy.  PCCM consulted for post op respiratory failure.     PAST MEDICAL HISTORY :  Past Medical History  Diagnosis Date  . Lung mass   . BPH (benign prostatic hyperplasia)   . Psoriasis   . Left inguinal hernia   . Osteoporosis   . Anxiety   . Hiatal hernia   . Positional vertigo   . Prolapse of mitral valve     click  . Sebaceous cyst     left shoulder  . Allergic rhinitis   . CAD (coronary artery disease) 08/12/12    LAD calcification noted on CT  . GERD (gastroesophageal reflux disease)     currently not using antacid, hasn't for approx. one yr.   . History of stress test 08/2012    told that it was wnl  . Heart murmur     told that he has murmur by Dr. Pete Glatter   . Shortness of breath     8-10 yrs.   Elon Jester)     recently has had pain in the neck & back of head   . Arthritis     pt. reports that he has aches & pain, unsure if its related to degenerative process     . Inguinal hernia     left side   . Sinusitis     frequently   Past Surgical History  Procedure Laterality Date  . Appendectomy      1945  .  Transurethral resection of prostate    . Eye surgery      cataracts removed & haas IOL in both eyes  . Tonsillectomy     Prior to Admission medications   Medication Sig Start Date End Date Taking? Authorizing Provider  Calcium Citrate-Vitamin D 315-250 MG-UNIT TABS Take by mouth daily.   Yes Historical Provider, MD  Colloidal Oatmeal (AVEENO ECZEMA THERAPY) 1 % CREA Apply 1 application topically daily as needed (eczema).   Yes Historical Provider, MD  Eyelid Cleansers (SYSTANE LID WIPES EX) Apply 1 application topically every morning. Apply externally to both eyes.   Yes Historical Provider, MD  fish oil-omega-3 fatty acids 1000 MG capsule Take 2 g by mouth daily.    Yes Historical Provider, MD  Hydrocortisone Butyr Lipo Base 0.1 % CREA Apply 1 application topically as needed. psoriasis   Yes Historical Provider, MD  LORazepam (ATIVAN) 0.5 MG tablet Take 0.5 mg by mouth at bedtime.    Yes Historical Provider, MD  Multiple Vitamins-Minerals (ICAPS PO) Take 2 tablets by mouth daily.    Yes Historical Provider, MD  Polyethyl Glycol-Propyl Glycol (SYSTANE ULTRA)  0.4-0.3 % SOLN Apply 1 drop to eye 2 (two) times daily as needed (dry eyes).   Yes Historical Provider, MD  triamcinolone (KENALOG) topical spray Apply topically 2 (two) times a week. 1 application   Yes Historical Provider, MD   No Known Allergies  FAMILY HISTORY:  Family History  Problem Relation Age of Onset  . Hyperlipidemia Father   . Hypertension Father   . Diabetes Father   . Colon polyps Mother   . Heart disease Father   . Heart disease Brother     #1  . Heart disease Brother     #2   SOCIAL HISTORY:  reports that he quit smoking about 34 years ago. His smoking use included Pipe. He does not have any smokeless tobacco history on file. He reports that  drinks alcohol. He reports that he does not use illicit drugs.  REVIEW OF SYSTEMS:  Unable to complete as pt is altered / sedate post-op.   SUBJECTIVE:   VITAL  SIGNS: Temp:  [97 F (36.1 C)] 97 F (36.1 C) (06/25 1238) Pulse Rate:  [54-64] 64 (06/25 1400) Resp:  [14-21] 14 (06/25 1400) BP: (112-133)/(53-64) 125/56 mmHg (06/25 1400) SpO2:  [99 %-100 %] 100 % (06/25 1400) Arterial Line BP: (121-147)/(58-71) 147/71 mmHg (06/25 1400)  INTAKE / OUTPUT: Intake/Output     06/24 0701 - 06/25 0700 06/25 0701 - 06/26 0700   I.V.  2000   Total Intake   2000   Urine  175   Blood  275   Chest Tube  40   Total Output   490   Net   +1510          PHYSICAL EXAMINATION: General:  Chronically ill, frail elderly male in NAD Neuro:  Sedate, opens eyes, follows commands HEENT:  Mm pink/moist, no JVD Cardiovascular:  s1s2 rrr, no m/r/g Lungs:  resp's even/non-labored, lungs bilaterally distant but clear.  CT x2 on R with 3/7 airleak Abdomen:  Round/soft, bsx4 active Musculoskeletal:  No acute deformities Skin:  Warm/dry, no edema  LABS:  Recent Labs Lab 09/12/12 1007  HGB 13.5  WBC 8.2  PLT 134*  NA 138  K 4.0  CL 103  CO2 25  GLUCOSE 111*  BUN 29*  CREATININE 0.80  CALCIUM 9.5  AST 20  ALT 13  ALKPHOS 84  BILITOT 0.4  PROT 6.8  ALBUMIN 3.5  APTT 38*  INR 0.99  PHART 7.447  PCO2ART 37.4  PO2ART 86.4   No results found for this basename: GLUCAP,  in the last 168 hours  CXR:  6/25 - resection of R apical mass, R CTx2, no ptx, IJ TLC in good position  ASSESSMENT / PLAN:  PULMONARY A: S/P R VATS / Lobectomy for apical mass P:   -pulmonary hygiene / IS -chest tubes per CVTS, 20 cm sxn -mobilize as able  CARDIOVASCULAR A:  MVP CAD  P:  -monitor in ICU post op  RENAL A:   P:   -monitor BMP / replace lytes as indicated  GASTROINTESTINAL A:   SUP Hx GERD - not on medications at home P:   -PPI  HEMATOLOGIC A:    P:  -SCD's  INFECTIOUS A:   Post op VATS with lobectomy & mass resection   P:   -monitor fever curve / leukocytosis -abx per CVTS  ENDOCRINE A:    P:   -monitor glucose on  BMP  NEUROLOGIC A:   Pain - post op VATS with lobectomy  P:   -fentanyl PCA per CVTS  Canary Brim, NP-C [facility] Pgr: [phone] or [phone]  Patient seen and examined, agree with above note, pulmonary hygienes as ordered, avoid positive pressure ventilation, continue home bronchodilator and will f/u in AM.  CT management per CVTS.  I have personally obtained a history, examined the patient, evaluated laboratory and imaging results, formulated the assessment and plan and placed orders.  09/14/2012, 2:26 PM  Patient seen and examined, agree with above note.  I dictated the care and orders written for this patient under my direction.  Alyson Reedy, MD [phone]

## 2012-09-14 NOTE — Anesthesia Postprocedure Evaluation (Signed)
Anesthesia Post Note  Patient: Tony Prince  Procedure(s) Performed: Procedure(s) (LRB): VIDEO ASSISTED THORACOSCOPY (VATS)/WEDGE RESECTION (Right) LOBECTOMY (Right) THORACOTOMY/LOBECTOMY (Right)  Anesthesia type: General  Patient location: PACU  Post pain: Pain level controlled and Adequate analgesia  Post assessment: Post-op Vital signs reviewed, Patient's Cardiovascular Status Stable, Respiratory Function Stable, Patent Airway and Pain level controlled  Last Vitals:  Filed Vitals:   09/14/12 1300  BP:   Pulse: 55  Temp:   Resp: 21    Post vital signs: Reviewed and stable  Level of consciousness: awake, alert  and oriented  Complications: No apparent anesthesia complications

## 2012-09-14 NOTE — Progress Notes (Signed)
Utilization Review Completed.Tony Prince T6/25/2014  

## 2012-09-14 NOTE — Interval H&P Note (Signed)
History and Physical Interval Note:  09/14/2012 8:14 AM  Tony Prince  has presented today for surgery, with the diagnosis of right upper lobe mass  The various methods of treatment have been discussed with the patient and family. After consideration of risks, benefits and other options for treatment, the patient has consented to  Procedure(s) with comments: VIDEO ASSISTED THORACOSCOPY (VATS)/WEDGE RESECTION (Right) - possible chest wall resection LOBECTOMY (Right) as a surgical intervention .  The patient's history has been reviewed, patient examined, no change in status, stable for surgery.  I have reviewed the patient's chart and labs.  Questions were answered to the patient's satisfaction.     HENDRICKSON,STEVEN C

## 2012-09-15 ENCOUNTER — Inpatient Hospital Stay (HOSPITAL_COMMUNITY): Payer: Medicare PPO

## 2012-09-15 ENCOUNTER — Encounter: Payer: Self-pay | Admitting: Critical Care Medicine

## 2012-09-15 LAB — POCT I-STAT 3, ART BLOOD GAS (G3+)
Acid-Base Excess: 1 mmol/L (ref 0.0–2.0)
Bicarbonate: 26 mEq/L — ABNORMAL HIGH (ref 20.0–24.0)
Patient temperature: 97.5
pH, Arterial: 7.39 (ref 7.350–7.450)

## 2012-09-15 LAB — BASIC METABOLIC PANEL
CO2: 27 mEq/L (ref 19–32)
Calcium: 8.1 mg/dL — ABNORMAL LOW (ref 8.4–10.5)
Creatinine, Ser: 0.79 mg/dL (ref 0.50–1.35)
GFR calc non Af Amer: 84 mL/min — ABNORMAL LOW (ref >90)
Glucose, Bld: 138 mg/dL — ABNORMAL HIGH (ref 70–99)
Sodium: 138 mEq/L (ref 135–145)

## 2012-09-15 LAB — GLUCOSE, CAPILLARY: Glucose-Capillary: 141 mg/dL — ABNORMAL HIGH (ref 70–99)

## 2012-09-15 LAB — CBC
Hemoglobin: 11 g/dL — ABNORMAL LOW (ref 13.0–17.0)
MCH: 28.2 pg (ref 26.0–34.0)
MCHC: 34.2 g/dL (ref 30.0–36.0)
MCV: 82.6 fL (ref 78.0–100.0)
RBC: 3.9 MIL/uL — ABNORMAL LOW (ref 4.22–5.81)

## 2012-09-15 MED ORDER — IPRATROPIUM-ALBUTEROL 20-100 MCG/ACT IN AERS
2.0000 | INHALATION_SPRAY | Freq: Four times a day (QID) | RESPIRATORY_TRACT | Status: DC
  Administered 2012-09-15 – 2012-09-17 (×11): 2 via RESPIRATORY_TRACT
  Filled 2012-09-15: qty 4

## 2012-09-15 MED ORDER — SODIUM CHLORIDE 0.9 % IJ SOLN
10.0000 mL | Freq: Two times a day (BID) | INTRAMUSCULAR | Status: DC
  Administered 2012-09-15 – 2012-09-16 (×2): 10 mL
  Administered 2012-09-16 – 2012-09-19 (×2): 20 mL

## 2012-09-15 MED ORDER — ENOXAPARIN SODIUM 30 MG/0.3ML ~~LOC~~ SOLN
40.0000 mg | SUBCUTANEOUS | Status: DC
  Administered 2012-09-15: 40 mg via SUBCUTANEOUS
  Filled 2012-09-15 (×2): qty 0.4

## 2012-09-15 MED ORDER — SODIUM CHLORIDE 0.9 % IV SOLN
Freq: Once | INTRAVENOUS | Status: AC
  Administered 2012-09-15: 18:00:00 via INTRAVENOUS

## 2012-09-15 MED ORDER — SODIUM CHLORIDE 0.9 % IJ SOLN
10.0000 mL | INTRAMUSCULAR | Status: DC | PRN
  Filled 2012-09-15: qty 20

## 2012-09-15 NOTE — Op Note (Signed)
Tony Prince, Tony Prince              ACCOUNT NO.:  [id number]  MEDICAL RECORD NO.:  [id number]  LOCATION:  2305                         FACILITY:  [facility]  PHYSICIAN:  Tony Prince. Tony Prince, M.D.DATE OF BIRTH:  [birth year]  DATE OF PROCEDURE:  09/14/2012 DATE OF DISCHARGE:                              OPERATIVE REPORT   PREOPERATIVE DIAGNOSIS:  Right apical mass.  POSTOPERATIVE DIAGNOSIS:  Probable non-small cell carcinoma, right upper lobe.  PROCEDURES: 1. Right thoracotomy. 2. Extrapleural right upper lobectomy. 3. Mediastinal lymph node dissection.  SURGEON:  Tony Prince. Tony Fetch, MD  ASSISTANT:  Tony Fudge, PA  ANESTHESIA:  General.  FINDINGS:  Approximately 4-cm mass in the right apex, densely adherent to the chest wall requiring extrapleural resection.  Frozen section of Mass showed highly atypical cells, likely non-small cell carcinoma.  Frozen section of resection margins negative.  CLINICAL NOTE:  Tony Prince is a 79 year old gentleman with a history of pipe smoking.  He recently presented with hemoptysis.  A chest x-ray showed right apical density.  A CT showed a mass in the area of old scarring.  He had a PET CT which showed the lesion to be hypermetabolic. Biopsy showed inflammatory changes, however, given that this was still highly suspicious, it was recommended he undergo surgical resection for definitive diagnosis and treatment.  The indications, risks, benefits, and alternatives were discussed in detail with the patient.  He understood and accepted the risks and agreed to proceed.  OPERATIVE NOTE:  Tony Prince was brought to the preoperative holding area on September 14, 2012, there Anesthesia placed a central line and an arterial blood pressure monitoring line.  Intravenous antibiotics were administered.  He was taken to the operating room, anesthetized, and intubated with a double-lumen endotracheal tube.  A Foley catheter was placed.  SCD hose were placed  for deep vein thrombosis prophylaxis.  He was placed in a left lateral decubitus position and the right chest was prepped and draped in usual sterile fashion.  Single lung ventilation of the left lung was carried out and was tolerated well throughout the procedure.  After performing a surgical time-out, an incision was made in the seventh intercostal space in the midaxillary line and was carried through the skin and subcutaneous tissue.  The chest was entered bluntly using a hemostat.  A port was inserted into the incision and a thoracoscope was placed into the chest.  There was initially air trapping. After Suction was applied, there was good deflation and no cross ventilation of the right lung.  Initial inspection showed no pleural effusion.  A small working port incision was made in the anteriorly in about the fourth intercostal space.  Initial dissection was started through this incision with electrocautery.  There were adhesions that were thin and filmy of the upper lobe to the chest wall.  These were taken down, but as the dissection progressed, it was obvious that there were denser adhesions and likely invasion of the parietal pleura at a minimum.  It was clear that this would not be able to be resected in a minimally invasive fashion, therefore decision was made to perform a posterolateral thoracotomy.  This was done in a serratus anterior  sparing fashion.  A small portion of the fifth rib was removed to improve exposure and retraction.  A retractor was placed.  Additional dissection was performed, however, it is clear this is going to be very difficult to do completely without being able to visualize posteriorly and medially and this would be more easily done after completing a lobectomy if this was in fact a cancerous lesion.  Tru-Cut needle biopsies were performed and were sent for frozen section.  There was abundant necrotic material. There wire highly atypical cells and  highly suspicious for non-small cell carcinoma. Given that the clinical suspicion and findings were consistent with a non-small cell carcinoma, the decision was made to perform a right upper lobectomy.  The inferior ligament was divided and the pleural reflection was divided at the hilum circumferentially. Level 8 and 9 lymph nodes were taken during division of the inferior ligament.  Next, attention was turned anteriorly.  The middle lobe vein was identified and preserved.  The upper lobe veins were dissected out, encircled, and divided with an endoscopic GIA stapler.  This allowed exposure of the upper lobe arterial branches.  These were again dissected out.  Lymph nodes (level 10 and 11) were encountered and taken out during exposure of the arterial branches.  Once these had been dissected out, the arterial branches were encircled and divided with endoscopic GIA stapler. After dissecting out the nodes and dividing the vessels the right upper lobe bronchus was identified and encircled.  The stapler was placed across the bronchus and closed, but not fired.  A test inflation showed good aeration of the lower and middle lobes.  The stapler then was fired.  The lobectomy was completed with sequential firings of Endo-GIA stapler to complete the major and minor fissures.  Now, with the upper lobe divided from the lung, it was easier to continue the dissection.  This dissection was carried into the chest wall to ensure clear margins including taking intercostal musculature and intercostal neurovascular bundles along with the resection.  There was no invasion of the ribs and no ribs had to be taken.  The mass was finally completely freed from the apical chest wall.  The specimen was placed into an endoscopic retrieval bag and removed.  The chest wall margin was marked and the specimen sent for frozen section of the margins.  The margins including the bronchial, vascular and chest wall margins  were all clear.  Hemostasis was achieved.  While awaiting the frozen section on the margins, the mediastinal lymph node dissection was Performed. The azygos vein was mobilized and the 4 lymph nodes were taken as a block of tissue.  There also was an additional 4R node that was taken as a separate specimen.  Next, the level 7 nodes were taken, these were enlarged, but appeared grossly benign.  These nodes were all sent for permanent sections.  The chest was filled with warm saline.  A test inflation at 30 cm of water revealed no leak from the bronchial stump. A 28-French straight and 36-French right angle chest tubes were placed and secured with #1 silk sutures.  The middle lobe was tacked to the lower lobe with interrupted 3-0 Vicryl figure-of-eight sutures.  Final inspection was made for hemostasis.  The ribs were reapproximated with #2 Vicryl pericostal sutures.  The lower and middle lobes then were reinflated.  There was good reinflation of both lobes.  The anterior port incision was closed with a #1 Vicryl fascial sutures and 2-0 Vicryl  subcuticular suture.  The posterolateral incision was closed with running #1 Vicryl fascial sutures followed by 2-0 Vicryl subcutaneous suture and a 3-0 Vicryl subcuticular suture.  All sponge, needle, instrument counts were correct at the end of the procedure.  The patient was taken from the operating room to the postanesthetic care unit, extubated, and in good health.    Tony Prince Tony Prince, M.D.    SCH/MEDQ  D:  09/14/2012  T:  09/15/2012  Job:  [id number]

## 2012-09-15 NOTE — Progress Notes (Addendum)
PULMONARY  / CRITICAL CARE MEDICINE  Name: Landan Fedie MRN: [id number] DOB: 05/09/1932    ADMISSION DATE:  09/14/2012 CONSULTATION DATE:  6/25   REFERRING MD :  Dr. Dorris Fetch  CHIEF COMPLAINT:  Post-Op Respiratory Failure  BRIEF PATIENT DESCRIPTION:  79 yo male with remote smoking hx presented with hemoptysis and found to have Rt upper lobe mass.  Referred to thoracic surgery.  Had non-diagnostic needle biopsy by IR.  S/P Rt VATS, extrapleural Rt upper lobectomy, and mediastinal LN dissection on 6/25.  PCCM asked to assist with post-op management.  SIGNIFICANT EVENTS:  6/25 - Admit for R VATS, lobectomy.    STUDIES: 6/02 PFT >> FEV1 2.54 (82%), FEV1% 76, TLC 5.08 (70%), DLCO 55%  LINES / TUBES: R CT x2 6/25>>> Rt radial A Line 6/25>>> Rt IJ CVL 6/25 >>  ANTIBIOTICS: Zinacef 6/25>>>  SUBJECTIVE:  Pain controlled.  Denies chest congestion.  Drank water last night w/o difficulty.  Feels tired.  VITAL SIGNS: Temp:  [96.9 F (36.1 C)-98.7 F (37.1 C)] 96.9 F (36.1 C) (06/26 0744) Pulse Rate:  [53-72] 53 (06/26 0700) Resp:  [11-23] 19 (06/26 0700) BP: (81-135)/(40-69) 97/54 mmHg (06/26 0700) SpO2:  [94 %-100 %] 98 % (06/26 0700) Arterial Line BP: (93-168)/(44-75) 110/50 mmHg (06/26 0700) Weight:  [143 lb 1.3 oz (64.9 kg)-144 lb (65.318 kg)] 143 lb 1.3 oz (64.9 kg) (06/26 0600) 3 liters [facility]  INTAKE / OUTPUT: Intake/Output     06/25 0701 - 06/26 0700 06/26 0701 - 06/27 0700   I.V. (mL/kg) 3569 (55)    IV Piggyback 300    Total Intake(mL/kg) 3869 (59.6)    Urine (mL/kg/hr) 985 (0.6)    Blood 275 (0.2)    Chest Tube 530 (0.3)    Total Output 1790     Net +2079            PHYSICAL EXAMINATION: General: No distress, sitting in chair Neuro: Awake, calm, normal strength HEENT: No sinus tenderness Cardiovascular: regular Lungs: scattered rhonchi, no wheeze, Rt chest tubes in place Abdomen: soft, non tender Musculoskeletal: no edema Skin:  Warm/dry    LABS: CBC Recent Labs     09/12/12  1007  09/15/12  0410  WBC  8.2  8.8  HGB  13.5  11.0*  HCT  39.6  32.2*  PLT  134*  127*    Coag's Recent Labs     09/12/12  1007  APTT  38*  INR  0.99    BMET Recent Labs     09/12/12  1007  09/15/12  0410  NA  138  138  K  4.0  4.3  CL  103  106  CO2  25  27  BUN  29*  22  CREATININE  0.80  0.79  GLUCOSE  111*  138*    Electrolytes Recent Labs     09/12/12  1007  09/15/12  0410  CALCIUM  9.5  8.1*    Sepsis Markers No results found for this basename: LACTICACIDVEN, PROCALCITON, O2SATVEN,  in the last 72 hours  ABG Recent Labs     09/12/12  1007  09/15/12  0411  PHART  7.447  7.390  PCO2ART  37.4  42.7  PO2ART  86.4  60.0*    Liver Enzymes Recent Labs     09/12/12  1007  AST  20  ALT  13  ALKPHOS  84  BILITOT  0.4  ALBUMIN  3.5    Cardiac Enzymes No  results found for this basename: TROPONINI, PROBNP,  in the last 72 hours  Glucose Recent Labs     09/14/12  1541  GLUCAP  141*    Imaging Dg Chest Portable 1 View  09/14/2012   *RADIOLOGY REPORT*  Clinical Data: Postoperative radiograph.  VATS on the right.  PORTABLE CHEST - 1 VIEW  Comparison: 09/12/2012.  Findings: Right apical mass has been resected.  The right thoracostomy tubes are present.  There is no visible right pneumothorax.  Right IJ central line is present with the tip in the mid SVC.  Perihilar distortion associated with recent operation. Gas tracks along the right rib cage.  Left lung demonstrates mild atelectasis.  Left pleural apical thickening is chronic.  The cardiopericardial silhouette is within normal limits.  Mediastinal contours within normal limits. Clips are present in the mediastinum compatible with nodal dissection.  IMPRESSION: 1.  Interval resection of the right apical mass with dual right thoracostomy tubes.  No pneumothorax. 2. Interval placement of right IJ central line with tip in the mid SVC.  No complicating features.    Original Report Authenticated By: Andreas Newport, M.D.      ASSESSMENT / PLAN:  S/P R VATS / Lobectomy for apical mass P:   -pulmonary hygiene / IS -oxygen as needed to keep SpO2 > 92% -chest tube per TCTS -mobilize as able -f/u final path results -cefuroxime for post-op prophylaxis per TCTS  Hx GERD - not on medications at home P:   -likely d/c PPI soon -advance diet per TCTS  Thrombocytopenia. P: -f/u CBC -SCD for DVT prevention >> add lovenox when okay with TCTS  Pain control. P: -per TCTS  Hx of psoriasis. P: -topical creams as needed  Coralyn Helling, MD [facility] Pulmonary/Critical Care 09/15/2012, 8:13 AM Pager:  [phone] After 3pm call: [phone]

## 2012-09-15 NOTE — Clinical Social Work Psychosocial (Signed)
Clinical Social Work Department BRIEF PSYCHOSOCIAL ASSESSMENT 09/15/2012  Patient:  Tony Prince, Tony Prince     Account Number:  [id number]     Admit date:  09/14/2012  Clinical Social Worker:  Madaline Guthrie  Date/Time:  09/15/2012 01:40 PM  Referred by:  Physician  Date Referred:  09/15/2012 Referred for  SNF Placement   Other Referral:   Interview type:  Family Other interview type:    PSYCHOSOCIAL DATA Living Status:  ALONE Admitted from facility:   Level of care:   Primary support name:  Tony Prince  [phone] Primary support relationship to patient:  CHILD, ADULT Degree of support available:   very good.    CURRENT CONCERNS Current Concerns  Post-Acute Placement   Other Concerns:    SOCIAL WORK ASSESSMENT / PLAN CSW met with pt and daughter, Tony Prince.  Pt was sitting up in chair and very alert.  Both he and Tony Prince engaged in conversation about plans for pt after discharge.  CSW educated them about the discharge planning process and SNF vs HH.  Pt and Tony Prince prefer for pt to go home with assistance and there is a good support system to help pt but he does live alone.  Pt has a long term care policy and is amenable to short term rehab/SNF if needed.  CSW told family that CM and CSW will follow pt's progression and assist them with the appropriate level of care that he will need at discharge.   Assessment/plan status:  Psychosocial Support/Ongoing Assessment of Needs Other assessment/ plan:   Information/referral to community resources:    PATIENT'S/FAMILY'S RESPONSE TO PLAN OF CARE: Pt and daughter were appreciative of information and assistance and interested in continued help with discharge planning.

## 2012-09-15 NOTE — Progress Notes (Signed)
1 Day Post-Op Procedure(s) (LRB): VIDEO ASSISTED THORACOSCOPY (VATS)/WEDGE RESECTION (Right) LOBECTOMY (Right) THORACOTOMY/LOBECTOMY (Right) Subjective: Some incisional pain No nausea this AM  Objective: Vital signs in last 24 hours: Temp:  [96.9 F (36.1 C)-98.7 F (37.1 C)] 96.9 F (36.1 C) (06/26 0744) Pulse Rate:  [53-72] 53 (06/26 0800) Cardiac Rhythm:  [-] Sinus bradycardia (06/26 0800) Resp:  [11-23] 18 (06/26 0800) BP: (81-135)/(40-69) 93/48 mmHg (06/26 0800) SpO2:  [94 %-100 %] 99 % (06/26 0800) Arterial Line BP: (93-168)/(44-75) 107/51 mmHg (06/26 0800) Weight:  [143 lb 1.3 oz (64.9 kg)-144 lb (65.318 kg)] 143 lb 1.3 oz (64.9 kg) (06/26 0600)  Hemodynamic parameters for last 24 hours:    Intake/Output from previous day: 06/25 0701 - 06/26 0700 In: 3869 [I.V.:3569; IV Piggyback:300] Out: 1790 [Urine:985; Blood:275; Chest Tube:530] Intake/Output this shift: Total I/O In: 100 [I.V.:100] Out: 120 [Urine:20; Chest Tube:100]  General appearance: alert and no distress Neurologic: intact Heart: regular rate and rhythm Lungs: diminished breath sounds bibasilar and wheezes bilaterally Abdomen: normal findings: soft, non-tender small air leak  Lab Results:  Recent Labs  09/12/12 1007 09/15/12 0410  WBC 8.2 8.8  HGB 13.5 11.0*  HCT 39.6 32.2*  PLT 134* 127*   BMET:  Recent Labs  09/12/12 1007 09/15/12 0410  NA 138 138  K 4.0 4.3  CL 103 106  CO2 25 27  GLUCOSE 111* 138*  BUN 29* 22  CREATININE 0.80 0.79  CALCIUM 9.5 8.1*    PT/INR:  Recent Labs  09/12/12 1007  LABPROT 13.0  INR 0.99   ABG    Component Value Date/Time   PHART 7.390 09/15/2012 0411   HCO3 26.0* 09/15/2012 0411   TCO2 27 09/15/2012 0411   O2SAT 91.0 09/15/2012 0411   CBG (last 3)   Recent Labs  09/14/12 1541  GLUCAP 141*    Assessment/Plan: S/P Procedure(s) (LRB): VIDEO ASSISTED THORACOSCOPY (VATS)/WEDGE RESECTION (Right) LOBECTOMY (Right) THORACOTOMY/LOBECTOMY  (Right) - CV- stable  RESP- s/p Right upper lobectomy  IS, will add combivent as he has some faint wheezing this AM  Path pending  Keep both CT to suction today  RENAL- creatinine and lytes OK  ENDO - CBG mildly elevated  Anemia secondary to ABL- mild, follow  DVT prophylaxis- SCD, will add lovenox today     LOS: 1 day    Verma Grothaus C 09/15/2012

## 2012-09-15 NOTE — Progress Notes (Signed)
Up in chair  Some soreness  BP 101/50  Pulse 54  Temp(Src) 97.4 F (36.3 C) (Oral)  Resp 18  Ht 5\' 11"  (1.803 m)  Wt 143 lb 1.3 oz (64.9 kg)  BMI 19.96 kg/m2  SpO2 98%   Intake/Output Summary (Last 24 hours) at 09/15/12 1726 Last data filed at 09/15/12 1600  Gross per 24 hour  Intake   2261 ml  Output   1520 ml  Net    741 ml    UO 25/ hr and BP a little low  Will give 500 ml of saline

## 2012-09-16 ENCOUNTER — Encounter (HOSPITAL_COMMUNITY): Payer: Self-pay | Admitting: Thoracic Surgery (Cardiothoracic Vascular Surgery)

## 2012-09-16 ENCOUNTER — Inpatient Hospital Stay (HOSPITAL_COMMUNITY): Payer: Medicare PPO

## 2012-09-16 LAB — COMPREHENSIVE METABOLIC PANEL
ALT: 12 U/L (ref 0–53)
AST: 20 U/L (ref 0–37)
Alkaline Phosphatase: 57 U/L (ref 39–117)
CO2: 26 mEq/L (ref 19–32)
Chloride: 104 mEq/L (ref 96–112)
GFR calc Af Amer: >90 mL/min (ref >90)
GFR calc non Af Amer: 84 mL/min — ABNORMAL LOW (ref >90)
Glucose, Bld: 144 mg/dL — ABNORMAL HIGH (ref 70–99)
Potassium: 4.2 mEq/L (ref 3.5–5.1)
Sodium: 136 mEq/L (ref 135–145)
Total Bilirubin: 0.5 mg/dL (ref 0.3–1.2)

## 2012-09-16 LAB — TYPE AND SCREEN
Antibody Screen: NEGATIVE
Unit division: 0

## 2012-09-16 LAB — CBC
Hemoglobin: 14.7 g/dL (ref 13.0–17.0)
Hemoglobin: 11.6 g/dL — ABNORMAL LOW (ref 13.0–17.0)
MCH: 28.6 pg (ref 26.0–34.0)
MCHC: 34.1 g/dL (ref 30.0–36.0)
Platelets: 83 10*3/uL — ABNORMAL LOW (ref 150–400)
Platelets: 137 10*3/uL — ABNORMAL LOW (ref 150–400)
RBC: 5.2 MIL/uL (ref 4.22–5.81)
RDW: 14.6 % (ref 11.5–15.5)
WBC: 6.5 10*3/uL (ref 4.0–10.5)

## 2012-09-16 MED ORDER — DIPHENHYDRAMINE HCL 50 MG/ML IJ SOLN: 25.0000 mg | Freq: Four times a day (QID) | INTRAMUSCULAR | Status: DC | PRN

## 2012-09-16 MED ORDER — HYDROMORPHONE 0.3 MG/ML IV SOLN
INTRAVENOUS | Status: DC
  Administered 2012-09-16: 20:00:00 via INTRAVENOUS
  Administered 2012-09-16: 2.57 mg via INTRAVENOUS
  Administered 2012-09-16: 09:00:00 via INTRAVENOUS
  Administered 2012-09-16: 1.5 mg via INTRAVENOUS
  Administered 2012-09-16: 2.46 mg via INTRAVENOUS
  Administered 2012-09-17: 0.6 mg via INTRAVENOUS
  Administered 2012-09-17: 2.25 mg via INTRAVENOUS
  Filled 2012-09-16 (×2): qty 25

## 2012-09-16 MED ORDER — SODIUM CHLORIDE 0.9 % IJ SOLN: 9.0000 mL | INTRAMUSCULAR | Status: DC | PRN

## 2012-09-16 MED ORDER — DIPHENHYDRAMINE HCL 12.5 MG/5ML PO ELIX: 25.0000 mg | ORAL_SOLUTION | Freq: Four times a day (QID) | ORAL | Status: DC | PRN

## 2012-09-16 MED ORDER — DIPHENHYDRAMINE HCL 12.5 MG/5ML PO ELIX
25.0000 mg | ORAL_SOLUTION | Freq: Four times a day (QID) | ORAL | Status: DC | PRN
  Filled 2012-09-16: qty 10

## 2012-09-16 MED ORDER — ONDANSETRON HCL 4 MG/2ML IJ SOLN: 4.0000 mg | Freq: Four times a day (QID) | INTRAMUSCULAR | Status: DC | PRN

## 2012-09-16 MED ORDER — NALOXONE HCL 0.4 MG/ML IJ SOLN: 0.4000 mg | INTRAMUSCULAR | Status: DC | PRN

## 2012-09-16 NOTE — Progress Notes (Signed)
PULMONARY  / CRITICAL CARE MEDICINE  Name: Tony Prince MRN: [id number] DOB: Apr 24, 1932    ADMISSION DATE:  09/14/2012 CONSULTATION DATE:  6/25   REFERRING MD :  Dr. Dorris Fetch  CHIEF COMPLAINT:  Post-Op Respiratory Failure  BRIEF PATIENT DESCRIPTION:  79 yo male with remote smoking hx presented with hemoptysis and found to have Rt upper lobe mass.  Referred to thoracic surgery.  Had non-diagnostic needle biopsy by IR.  S/P Rt VATS, extrapleural Rt upper lobectomy, and mediastinal LN dissection on 6/25.  PCCM asked to assist with post-op management.  SIGNIFICANT EVENTS:  6/25 - Admit for R VATS, lobectomy.   6/27 - Lovenox held for thrombocytopenia  STUDIES: 6/02 PFT >> FEV1 2.54 (82%), FEV1% 76, TLC 5.08 (70%), DLCO 55%  LINES / TUBES: R anterior CT 6/25>>> 6/27 R posterior CT 6/25>>> Rt radial A Line 6/25>>> Rt IJ CVL 6/25>>>  ANTIBIOTICS: Zinacef 6/25>>>6/26  SUBJECTIVE:  Had trouble sleeping last night >> hard to get comfortable.  Still sore when he coughs.  Using IS.  VITAL SIGNS: Temp:  [97.4 F (36.3 C)-99.8 F (37.7 C)] 99.8 F (37.7 C) (06/27 0751) Pulse Rate:  [51-87] 84 (06/27 0800) Resp:  [13-27] 27 (06/27 0840) BP: (89-131)/(42-107) 92/60 mmHg (06/27 0800) SpO2:  [91 %-100 %] 94 % (06/27 0840) FiO2 (%):  [96 %-100 %] 100 % (06/26 1528) Weight:  [141 lb 5 oz (64.1 kg)] 141 lb 5 oz (64.1 kg) (06/27 0400) Room air  INTAKE / OUTPUT: Intake/Output     06/26 0701 - 06/27 0700 06/27 0701 - 06/28 0700   I.V. (mL/kg) 1747.7 (27.3)    IV Piggyback 50    Total Intake(mL/kg) 1797.7 (28)    Urine (mL/kg/hr) 1115 (0.7)    Blood     Chest Tube 410 (0.3) 80 (0.7)   Total Output 1525 80   Net +272.7 -80          PHYSICAL EXAMINATION: General: No distress, sitting in chair Neuro: Awake, calm, normal strength HEENT: No sinus tenderness Cardiovascular: regular Lungs: scattered rhonchi, no wheeze, Rt chest tubes in place Abdomen: soft, non  tender Musculoskeletal: no edema Skin:  Warm/dry   LABS: CBC Recent Labs     09/15/12  0410  09/16/12  0435  WBC  8.8  6.5  HGB  11.0*  14.7  HCT  32.2*  43.6  PLT  127*  83*    Coag's No results found for this basename: APTT, INR,  in the last 72 hours  BMET Recent Labs     09/15/12  0410  09/16/12  0435  NA  138  136  K  4.3  4.2  CL  106  104  CO2  27  26  BUN  22  16  CREATININE  0.79  0.79  GLUCOSE  138*  144*    Electrolytes Recent Labs     09/15/12  0410  09/16/12  0435  CALCIUM  8.1*  8.2*    Sepsis Markers No results found for this basename: LACTICACIDVEN, PROCALCITON, O2SATVEN,  in the last 72 hours  ABG Recent Labs     09/15/12  0411  PHART  7.390  PCO2ART  42.7  PO2ART  60.0*    Liver Enzymes Recent Labs     09/16/12  0435  AST  20  ALT  12  ALKPHOS  57  BILITOT  0.5  ALBUMIN  2.3*    Cardiac Enzymes No results found for this basename: TROPONINI,  PROBNP,  in the last 72 hours  Glucose Recent Labs     09/14/12  1541  GLUCAP  141*    Imaging Dg Chest Port 1 View  09/16/2012   *RADIOLOGY REPORT*  Clinical Data: Lung cancer.  Thoracotomy.  Chest tube.  Right pneumothorax.  PORTABLE CHEST - 1 VIEW  Comparison: 09/15/2012  Findings: The small right apical pneumothorax has decreased.  Chest tubes and central line are unchanged.  There is new accentuation of the interstitial markings and vascularity appear slightly more prominent.  New slight atelectasis at the lung bases.  IMPRESSION:  1.  Decrease in the small right apical pneumothorax. 2.  Slight increase in vascularity and interstitial prominence which may represent mild interstitial edema with new slight atelectasis at the bases.   Original Report Authenticated By: Francene Boyers, M.D.   Dg Chest Port 1 View  09/15/2012   *RADIOLOGY REPORT*  Clinical Data: Right upper lobe lung cancer.  Status post resection of a right apical lung mass.  PORTABLE CHEST - 1 VIEW  Comparison: 06/25  and 09/12/2012  Findings: Two right-sided chest tubes are in place.  There is a small right apical pneumothorax.  Central line appears in good position.  Heart size and vascularity are normal.  Aeration of the left lung has improved.  Minimal linear atelectasis at the lung bases.  IMPRESSION: Small right apical pneumothorax.   Original Report Authenticated By: Francene Boyers, M.D.   Dg Chest Portable 1 View  09/14/2012   *RADIOLOGY REPORT*  Clinical Data: Postoperative radiograph.  VATS on the right.  PORTABLE CHEST - 1 VIEW  Comparison: 09/12/2012.  Findings: Right apical mass has been resected.  The right thoracostomy tubes are present.  There is no visible right pneumothorax.  Right IJ central line is present with the tip in the mid SVC.  Perihilar distortion associated with recent operation. Gas tracks along the right rib cage.  Left lung demonstrates mild atelectasis.  Left pleural apical thickening is chronic.  The cardiopericardial silhouette is within normal limits.  Mediastinal contours within normal limits. Clips are present in the mediastinum compatible with nodal dissection.  IMPRESSION: 1.  Interval resection of the right apical mass with dual right thoracostomy tubes.  No pneumothorax. 2. Interval placement of right IJ central line with tip in the mid SVC.  No complicating features.   Original Report Authenticated By: Andreas Newport, M.D.      ASSESSMENT / PLAN:  S/P R VATS / Lobectomy for apical mass P:   -pulmonary hygiene / IS -oxygen as needed to keep SpO2 > 92% -chest tube per TCTS -mobilize as able -f/u final path results -cefuroxime for post-op prophylaxis per TCTS -continue BD's  Hx GERD - not on medications at home P:   -likely d/c PPI soon -advance diet per TCTS  Thrombocytopenia. P: -f/u CBC -SCD for DVT prevention  Pain control. P: -per TCTS  Hx of psoriasis. P: -topical creams as needed  PCCM will be available as needed over weekend.  Will plan to  follow up on 6/30.  Call if help needed sooner.  Coralyn Helling, MD [facility] Pulmonary/Critical Care 09/16/2012, 8:48 AM Pager:  [phone] After 3pm call: [phone]

## 2012-09-16 NOTE — Progress Notes (Signed)
Patient unable to void post foley removal,  Initial bladder scan volume less than at 2045.  Re-scan 2 hours later shows a volume of 367.  Patient refuses in and out cath at this time.  Will continue to monitor and re-assess for ability to void and bladder scan volume.

## 2012-09-16 NOTE — Progress Notes (Signed)
Repeat CBC much more consistent with his previous CBC- mild anemia and thrombocytopenia

## 2012-09-16 NOTE — Progress Notes (Signed)
2 Days Post-Op Procedure(s) (LRB): VIDEO ASSISTED THORACOSCOPY (VATS)/WEDGE RESECTION (Right) LOBECTOMY (Right) THORACOTOMY/LOBECTOMY (Right) Subjective: C/o itching Difficulty getting comfortable  Objective: Vital signs in last 24 hours: Temp:  [97.4 F (36.3 C)-99.8 F (37.7 C)] 99.8 F (37.7 C) (06/27 0751) Pulse Rate:  [51-87] 84 (06/27 0800) Cardiac Rhythm:  [-] Normal sinus rhythm (06/27 0600) Resp:  [13-27] 27 (06/27 0800) BP: (89-131)/(42-107) 92/60 mmHg (06/27 0800) SpO2:  [91 %-100 %] 94 % (06/27 0800) FiO2 (%):  [96 %-100 %] 100 % (06/26 1528) Weight:  [141 lb 5 oz (64.1 kg)] 141 lb 5 oz (64.1 kg) (06/27 0400)  Hemodynamic parameters for last 24 hours:    Intake/Output from previous day: 06/26 0701 - 06/27 0700 In: 1797.7 [I.V.:1747.7; IV Piggyback:50] Out: 1525 [Urine:1115; Chest Tube:410] Intake/Output this shift:    General appearance: alert and no distress Neurologic: intact Heart: regular rate and rhythm Lungs: diminished breath sounds bibasilar Abdomen: normal findings: soft, non-tender Wound: clean and dry  Lab Results:  Recent Labs  09/15/12 0410 09/16/12 0435  WBC 8.8 6.5  HGB 11.0* 14.7  HCT 32.2* 43.6  PLT 127* 83*   BMET:  Recent Labs  09/15/12 0410 09/16/12 0435  NA 138 136  K 4.3 4.2  CL 106 104  CO2 27 26  GLUCOSE 138* 144*  BUN 22 16  CREATININE 0.79 0.79  CALCIUM 8.1* 8.2*    PT/INR: No results found for this basename: LABPROT, INR,  in the last 72 hours ABG    Component Value Date/Time   PHART 7.390 09/15/2012 0411   HCO3 26.0* 09/15/2012 0411   TCO2 27 09/15/2012 0411   O2SAT 91.0 09/15/2012 0411   CBG (last 3)   Recent Labs  09/14/12 1541  GLUCAP 141*    Assessment/Plan: S/P Procedure(s) (LRB): VIDEO ASSISTED THORACOSCOPY (VATS)/WEDGE RESECTION (Right) LOBECTOMY (Right) THORACOTOMY/LOBECTOMY (Right) Plan for transfer to step-down: see transfer orders  POD # 2 Right Upper Lobectomy  CV- stable  RESP-  air leak resolved- dc anterior CT  Posterior CT to water seal  DC posterior tube when drainage < 200 ml/24 hours  Pulmonary hygiene  PATH- pending  RENAL- lytes, creatinine OK, UO better after NS bolus  HEME- anemia resolved- surprising result will recheck  Thrombocytopenia- recheck, but stop lovenox in meantime and check HIT panel  Continue ambulation Transfer to 3300   LOS: 2 days    Larnell Granlund C 09/16/2012

## 2012-09-16 NOTE — Progress Notes (Signed)
Patient ID: Tony Prince, male   DOB: [birth year], 79 y.o.   MRN: [id number] EVENING ROUNDS NOTE :     [address]       [place] [address]             [phone]                 2 Days Post-Op Procedure(s) (LRB): VIDEO ASSISTED THORACOSCOPY (VATS)/WEDGE RESECTION (Right) LOBECTOMY (Right) THORACOTOMY/LOBECTOMY (Right)  Total Length of Stay:  LOS: 2 days  BP 110/62  Pulse 75  Temp(Src) 97.8 F (36.6 C) (Oral)  Resp 25  Ht 5\' 11"  (1.803 m)  Wt 141 lb 5 oz (64.1 kg)  BMI 19.72 kg/m2  SpO2 92%  .Intake/Output     06/26 0701 - 06/27 0700 06/27 0701 - 06/28 0700   I.V. (mL/kg) 1747.7 (27.3) 347.3 (5.4)   IV Piggyback 50    Total Intake(mL/kg) 1797.7 (28) 347.3 (5.4)   Urine (mL/kg/hr) 1115 (0.7) 200 (0.3)   Blood     Chest Tube 410 (0.3) 180 (0.2)   Total Output 1525 380   Net +272.7 -32.7          . dextrose 5 % and 0.9 % NaCl with KCl 20 mEq/L 20 mL/hr at 09/16/12 0900     Lab Results  Component Value Date   WBC 11.9* 09/16/2012   HGB 11.6* 09/16/2012   HCT 34.0* 09/16/2012   PLT 137* 09/16/2012   GLUCOSE 144* 09/16/2012   ALT 12 09/16/2012   AST 20 09/16/2012   NA 136 09/16/2012   K 4.2 09/16/2012   CL 104 09/16/2012   CREATININE 0.79 09/16/2012   BUN 16 09/16/2012   CO2 26 09/16/2012   INR 0.99 09/12/2012   Foley out  Trying to urinate may need foley back  Delight Ovens MD  Beeper [phone] Office [phone] 09/16/2012 6:40 PM

## 2012-09-16 NOTE — Progress Notes (Signed)
Spoke with Dr. Luisa Hart in Pathology  The final pathology report is till pending, but per Dr. Luisa Hart no definite tumor was seen. There was an intense inflammatory/ fibrotic response and necrosis/ abscess formation and highly atypical cells, but no definite tumor. Nodes were reactive.  I discussed this result with the patient and his family

## 2012-09-17 ENCOUNTER — Inpatient Hospital Stay (HOSPITAL_COMMUNITY): Payer: Medicare PPO

## 2012-09-17 LAB — BASIC METABOLIC PANEL
BUN: 20 mg/dL (ref 6–23)
CO2: 23 mEq/L (ref 19–32)
Chloride: 98 mEq/L (ref 96–112)
Glucose, Bld: 145 mg/dL — ABNORMAL HIGH (ref 70–99)
Potassium: 4.1 mEq/L (ref 3.5–5.1)
Sodium: 131 mEq/L — ABNORMAL LOW (ref 135–145)

## 2012-09-17 LAB — CBC
HCT: 33.7 % — ABNORMAL LOW (ref 39.0–52.0)
Hemoglobin: 11.5 g/dL — ABNORMAL LOW (ref 13.0–17.0)
MCHC: 34.1 g/dL (ref 30.0–36.0)
RBC: 4.06 MIL/uL — ABNORMAL LOW (ref 4.22–5.81)
WBC: 8.4 10*3/uL (ref 4.0–10.5)

## 2012-09-17 NOTE — Progress Notes (Signed)
Patient bladder scan volume 511, continues to refuse catheter.  Patient then able to void small amounts on prompted voids.  Will continue to monitor.

## 2012-09-17 NOTE — Progress Notes (Signed)
[facility] DAILY ICU PROGRESS NOTE                   [address]            [place] [address]          [phone]   3 Days Post-Op Procedure(s) (LRB): VIDEO ASSISTED THORACOSCOPY (VATS)/WEDGE RESECTION (Right) LOBECTOMY (Right) THORACOTOMY/LOBECTOMY (Right)  Total Length of Stay:  LOS: 3 days   Subjective: Feels better, voided 200 ml just now  Objective: Vital signs in last 24 hours: Temp:  [97.8 F (36.6 C)-99.8 F (37.7 C)] 97.9 F (36.6 C) (06/28 0747) Pulse Rate:  [63-84] 66 (06/28 0700) Cardiac Rhythm:  [-] Normal sinus rhythm (06/28 0400) Resp:  [10-28] 17 (06/28 0700) BP: (87-161)/(48-105) 125/51 mmHg (06/28 0700) SpO2:  [88 %-98 %] 94 % (06/28 0700)  Filed Weights   09/14/12 1545 09/15/12 0600 09/16/12 0400  Weight: 144 lb (65.318 kg) 143 lb 1.3 oz (64.9 kg) 141 lb 5 oz (64.1 kg)    Weight change:    Hemodynamic parameters for last 24 hours:    Intake/Output from previous day: 06/27 0701 - 06/28 0700 In: 599.8 [I.V.:599.8] Out: 1005 [Urine:625; Chest Tube:380]  Intake/Output this shift:    Current Meds: Scheduled Meds: . bisacodyl  10 mg Oral Daily  . HYDROmorphone PCA 0.3 mg/mL   Intravenous Q4H  . Ipratropium-Albuterol  2 puff Inhalation QID  . LORazepam  0.5 mg Oral QHS  . omega-3 acid ethyl esters  2 g Oral Daily  . pantoprazole  40 mg Oral Daily  . sodium chloride  10-40 mL Intracatheter Q12H   Continuous Infusions: . dextrose 5 % and 0.9 % NaCl with KCl 20 mEq/L 20 mL/hr at 09/16/12 0900   PRN Meds:.diphenhydrAMINE, diphenhydrAMINE, naloxone, ondansetron (ZOFRAN) IV, oxyCODONE-acetaminophen, polyvinyl alcohol, potassium chloride, promethazine, senna-docusate, sodium chloride, sodium chloride, traMADol  General appearance: alert and cooperative Neurologic: intact Heart: regular rate and rhythm, S1, S2 normal, no murmur, click, rub or gallop Lungs: clear to auscultation bilaterally Abdomen: soft, non-tender; bowel sounds  normal; no masses,  no organomegaly Extremities: extremities normal, atraumatic, no cyanosis or edema and Homans sign is negative, no sign of DVT small air leak with cough  Lab Results: CBC: Recent Labs  09/16/12 0850 09/17/12 0401  WBC 11.9* 8.4  HGB 11.6* 11.5*  HCT 34.0* 33.7*  PLT 137* 142*   BMET:  Recent Labs  09/16/12 0435 09/17/12 0401  NA 136 131*  K 4.2 4.1  CL 104 98  CO2 26 23  GLUCOSE 144* 145*  BUN 16 20  CREATININE 0.79 0.92  CALCIUM 8.2* 8.4    PT/INR: No results found for this basename: LABPROT, INR,  in the last 72 hours Radiology: Dg Chest Port 1 View  09/16/2012   *RADIOLOGY REPORT*  Clinical Data: Lung cancer.  Thoracotomy.  Chest tube.  Right pneumothorax.  PORTABLE CHEST - 1 VIEW  Comparison: 09/15/2012  Findings: The small right apical pneumothorax has decreased.  Chest tubes and central line are unchanged.  There is new accentuation of the interstitial markings and vascularity appear slightly more prominent.  New slight atelectasis at the lung bases.  IMPRESSION:  1.  Decrease in the small right apical pneumothorax. 2.  Slight increase in vascularity and interstitial prominence which may represent mild interstitial edema with new slight atelectasis at the bases.   Original Report Authenticated By: Francene Boyers, M.D.   Dg Chest Port 1 View  09/15/2012   *  RADIOLOGY REPORT*  Clinical Data: Right upper lobe lung cancer.  Status post resection of a right apical lung mass.  PORTABLE CHEST - 1 VIEW  Comparison: 06/25 and 09/12/2012  Findings: Two right-sided chest tubes are in place.  There is a small right apical pneumothorax.  Central line appears in good position.  Heart size and vascularity are normal.  Aeration of the left lung has improved.  Minimal linear atelectasis at the lung bases.  IMPRESSION: Small right apical pneumothorax.   Original Report Authenticated By: Francene Boyers, M.D.     Assessment/Plan: S/P Procedure(s) (LRB): VIDEO ASSISTED  THORACOSCOPY (VATS)/WEDGE RESECTION (Right) LOBECTOMY (Right) THORACOTOMY/LOBECTOMY (Right) Monitor voiding, history of prostate problems in the past Leave ct today waiting for 3300 bed     Jackston Oaxaca B 09/17/2012 7:48 AM

## 2012-09-17 NOTE — Progress Notes (Signed)
Pt tx from 2300, pt stable, pt oriented to room, family at [facility], all questions answered

## 2012-09-18 ENCOUNTER — Inpatient Hospital Stay (HOSPITAL_COMMUNITY): Payer: Medicare PPO

## 2012-09-18 LAB — BASIC METABOLIC PANEL
BUN: 16 mg/dL (ref 6–23)
CO2: 29 mEq/L (ref 19–32)
Calcium: 8.3 mg/dL — ABNORMAL LOW (ref 8.4–10.5)
Chloride: 103 mEq/L (ref 96–112)
Creatinine, Ser: 0.91 mg/dL (ref 0.50–1.35)
GFR calc Af Amer: >90 mL/min (ref >90)
GFR calc non Af Amer: 80 mL/min — ABNORMAL LOW (ref >90)
Glucose, Bld: 109 mg/dL — ABNORMAL HIGH (ref 70–99)
Potassium: 3.9 mEq/L (ref 3.5–5.1)
Sodium: 136 mEq/L (ref 135–145)

## 2012-09-18 LAB — CBC
HCT: 28.9 % — ABNORMAL LOW (ref 39.0–52.0)
Hemoglobin: 10 g/dL — ABNORMAL LOW (ref 13.0–17.0)
MCH: 28.2 pg (ref 26.0–34.0)
MCHC: 34.6 g/dL (ref 30.0–36.0)
MCV: 81.4 fL (ref 78.0–100.0)
Platelets: 143 10*3/uL — ABNORMAL LOW (ref 150–400)
RBC: 3.55 MIL/uL — ABNORMAL LOW (ref 4.22–5.81)
RDW: 14.4 % (ref 11.5–15.5)
WBC: 5.1 10*3/uL (ref 4.0–10.5)

## 2012-09-18 MED ORDER — SODIUM CHLORIDE 0.9 % IJ SOLN
INTRAMUSCULAR | Status: AC
  Administered 2012-09-18: 10 mL
  Filled 2012-09-18: qty 10

## 2012-09-18 NOTE — Progress Notes (Addendum)
       [address]       [name] [address]             [phone]          4 Days Post-Op Procedure(s) (LRB): VIDEO ASSISTED THORACOSCOPY (VATS)/WEDGE RESECTION (Right) LOBECTOMY (Right) THORACOTOMY/LOBECTOMY (Right)  Subjective: Sore at CT site, but Ultram helps.  Voiding without problem.  Breathing stable.   Objective: Vital signs in last 24 hours: Patient Vitals for the past 24 hrs:  BP Temp Temp src Pulse Resp SpO2  09/18/12 0331 112/60 mmHg 97.7 F (36.5 C) Oral 67 19 92 %  09/17/12 2305 105/61 mmHg 98.1 F (36.7 C) Oral 71 18 94 %  09/17/12 2108 - - - - - 96 %  09/17/12 2005 137/69 mmHg 97.6 F (36.4 C) Oral 72 20 99 %  09/17/12 1730 141/62 mmHg 97.8 F (36.6 C) Oral 72 18 95 %  09/17/12 1600 124/60 mmHg - - 67 20 97 %  09/17/12 1539 - 97.6 F (36.4 C) Oral - - -  09/17/12 1206 - 97.9 F (36.6 C) Oral - - -  09/17/12 1200 120/60 mmHg - - 68 20 97 %  09/17/12 1125 - - - - - 97 %  09/17/12 1100 153/75 mmHg - - 72 24 98 %  09/17/12 1000 102/54 mmHg - - 67 21 95 %  09/17/12 0900 121/57 mmHg - - 74 23 99 %   Current Weight  09/16/12 141 lb 5 oz (64.1 kg)     Intake/Output from previous day: 06/28 0701 - 06/29 0700 In: 820 [P.O.:600; I.V.:220] Out: 2700 [Urine:2700]    PHYSICAL EXAM:  Heart: RRR Lungs:Clear Wound: Clean and dry Chest tube: no air leak    Lab Results: CBC: Recent Labs  09/17/12 0401 09/18/12 0500  WBC 8.4 5.1  HGB 11.5* 10.0*  HCT 33.7* 28.9*  PLT 142* 143*   BMET:  Recent Labs  09/17/12 0401 09/18/12 0500  NA 131* 136  K 4.1 3.9  CL 98 103  CO2 23 29  GLUCOSE 145* 109*  BUN 20 16  CREATININE 0.92 0.91  CALCIUM 8.4 8.3*    PT/INR: No results found for this basename: LABPROT, INR,  in the last 72 hours  CXR: stable right apical space   Assessment/Plan: S/P Procedure(s) (LRB): VIDEO ASSISTED THORACOSCOPY (VATS)/WEDGE RESECTION (Right) LOBECTOMY (Right) THORACOTOMY/LOBECTOMY  (Right) CXR with stable apical space, CT without air leak.  Possibly d/c CT soon. Continue pulm toilet/IS, ambulation. Pt has requested that we stop the Combivent inhaler as it feels irritating to him.  Will discuss with MD.  He is not wheezing and sats are >90% on RA.   LOS: 4 days    COLLINS,GINA H 09/18/2012  D/c ct today Stop combivent I have seen and examined Tony Prince and agree with the above assessment  and plan.  Delight Ovens MD Beeper [phone] Office [phone] 09/18/2012 9:34 AM

## 2012-09-18 NOTE — Progress Notes (Signed)
CT d/c per MD order, pt tol well, pt educated to call if sob, cp/tightness, any changes from baseline, nursing will cont to monitor

## 2012-09-19 ENCOUNTER — Inpatient Hospital Stay (HOSPITAL_COMMUNITY): Payer: Medicare PPO

## 2012-09-19 DIAGNOSIS — Z9889 Other specified postprocedural states: Secondary | ICD-10-CM

## 2012-09-19 DIAGNOSIS — Z902 Acquired absence of lung [part of]: Secondary | ICD-10-CM

## 2012-09-19 LAB — BASIC METABOLIC PANEL
BUN: 14 mg/dL (ref 6–23)
CO2: 29 mEq/L (ref 19–32)
Calcium: 8.5 mg/dL (ref 8.4–10.5)
Chloride: 99 mEq/L (ref 96–112)
Creatinine, Ser: 0.84 mg/dL (ref 0.50–1.35)
GFR calc Af Amer: >90 mL/min (ref >90)
GFR calc non Af Amer: 82 mL/min — ABNORMAL LOW (ref >90)
Glucose, Bld: 106 mg/dL — ABNORMAL HIGH (ref 70–99)
Potassium: 3.6 mEq/L (ref 3.5–5.1)
Sodium: 136 mEq/L (ref 135–145)

## 2012-09-19 LAB — CBC
HCT: 29 % — ABNORMAL LOW (ref 39.0–52.0)
Hemoglobin: 10.1 g/dL — ABNORMAL LOW (ref 13.0–17.0)
MCH: 28.4 pg (ref 26.0–34.0)
MCHC: 34.8 g/dL (ref 30.0–36.0)
MCV: 81.5 fL (ref 78.0–100.0)
Platelets: 155 10*3/uL (ref 150–400)
RBC: 3.56 MIL/uL — ABNORMAL LOW (ref 4.22–5.81)
RDW: 14.3 % (ref 11.5–15.5)
WBC: 5.6 10*3/uL (ref 4.0–10.5)

## 2012-09-19 MED ORDER — OXYCODONE-ACETAMINOPHEN 5-325 MG PO TABS: 1.0000 | ORAL_TABLET | ORAL | Status: DC | PRN

## 2012-09-19 MED ORDER — IBUPROFEN 800 MG PO TABS: ORAL_TABLET | ORAL | Status: DC

## 2012-09-19 MED ORDER — TRAMADOL HCL 50 MG PO TABS: 50.0000 mg | ORAL_TABLET | Freq: Four times a day (QID) | ORAL | Status: DC | PRN

## 2012-09-19 NOTE — Discharge Summary (Signed)
Physician Discharge Summary  Patient ID: Tony Prince MRN: [id number] DOB/AGE: [birth year] 79 y.o.  Admit date: 09/14/2012 Discharge date: 09/19/2012  Admission Diagnoses:  Patient Active Problem List   Diagnosis Date Noted  . Hypoxemia 09/14/2012  . Lung mass   . ANXIETY STATE, UNSPECIFIED 02/10/2007  . IRITIS 02/10/2007  . BRADYCARDIA, CHRONIC 02/10/2007  . RHINITIS, VASOMOTOR 02/10/2007  . BENZODIAZEPINE ADDICTION 11/19/2006  . OSTEOPENIA 11/19/2006   Discharge Diagnoses:   Patient Active Problem List   Diagnosis Date Noted  . S/P thoracotomy 09/19/2012  . S/P lobectomy of lung 09/19/2012  . Hypoxemia 09/14/2012  . Lung mass   . ANXIETY STATE, UNSPECIFIED 02/10/2007  . IRITIS 02/10/2007  . BRADYCARDIA, CHRONIC 02/10/2007  . RHINITIS, VASOMOTOR 02/10/2007  . BENZODIAZEPINE ADDICTION 11/19/2006  . OSTEOPENIA 11/19/2006   Discharged Condition: good  History of Present Illness:   Mr. Portela is a 79 yo white male with history of pipe smoking who presented to his PCP with a complaint of hemoptysis.  CXR showed an opacity present in the right apex.  Further workup with CT scan showed the presence of a mass in the area with old scarring at the right apex adjacent to the chest wall.  He was subsequently referred to [facility] for evaluation for possible surgical resection.  He was evaluated by Dr. Dorris Fetch on 08/17/2012 at which time his studies were reviewed and it was felt to beneficial to treat the patient as the lesion in cancer until proven otherwise.  It was felt he should proceed with needle biopsy first.  That way if a cancer diagnosis is made the patient can be treated with chemotherapy and radiation.  It was also felt patient should undergo PET CT scan for staging purposes.  The patient underwent diagnostic needle biopsy on 08/24/2012 which showed inflammatory changes.  He again followed up with Dr. Dorris Fetch on 08/30/2012 at which time it was felt the patient should undergo  surgical resection to get an adequate tissue specimen for diagnosis.  The PET CT showed evidence of hypermetabolic activity with an SVU of 54.  The risks and benefits of the procedure were explained to the patient and he was agreeable to proceed.  He was noted to have heavy calcification of his LAD on CT scan.  It was felt he would need Cardiology clearance prior to proceeding with surgery.    Hospital Course:   The patient presented to [facility].  He was taken to the operating room and underwent a Right Thoracotomy with Extrapleural right upper lobectomy and Mediastinal Lymph Node Dissection.  Specimen sent for frozen pathology was suspicious for Non-Small Cancer however no definitive diagnosis could be made.  The patient tolerated the procedure well, was extubated and taken to the PACU in stable condition.  Pulmonary consult was obtained to help assist is management of patient in post operative period.  Patient has progressed nicely post operatively.  His chest tubes have slowly been removed as CXR tolerates.  Patient has complained of itching throughout hospital stay which is most likely related to his chronic history of Psoriasis.  The patient is doing well.  He is maintaining good oxygen saturations on room air.  He is ambulating without difficulty.  He is medically stable at discharge today 09/19/2012.  The patient will need to follow up with Dr. Dorris Fetch in 2 weeks with a CXR.  He will also need to follow up with Pulmonary care in 4 weeks.  Final Pathology has yet to be reported.  Consults: pulmonary/intensive care  Treatments: surgery:   1. Right thoracotomy.  2. Extrapleural right upper lobectomy.  3. Mediastinal lymph node dissection  Disposition: Home  Discharge medications:     Medication List         AVEENO ECZEMA THERAPY 1 % Crea  Generic drug:  Colloidal Oatmeal  Apply 1 application topically daily as needed (eczema).     Calcium Citrate-Vitamin D 315-250  MG-UNIT Tabs  Take by mouth daily.     fish oil-omega-3 fatty acids 1000 MG capsule  Take 2 g by mouth daily.     Hydrocortisone Butyr Lipo Base 0.1 % Crea  Apply 1 application topically as needed. psoriasis        ICAPS PO  Take 2 tablets by mouth daily.     LORazepam 0.5 MG tablet  Commonly known as:  ATIVAN  Take 0.5 mg by mouth at bedtime.     oxyCODONE-acetaminophen 5-325 MG per tablet  Commonly known as:  PERCOCET/ROXICET  Take 1-2 tablets by mouth every 4 (four) hours as needed.     SYSTANE LID WIPES EX  Apply 1 application topically every morning. Apply externally to both eyes.     SYSTANE ULTRA 0.4-0.3 % Soln  Generic drug:  Polyethyl Glycol-Propyl Glycol  Apply 1 drop to eye 2 (two) times daily as needed (dry eyes).     traMADol 50 MG tablet  Commonly known as:  ULTRAM  Take 1-2 tablets (50-100 mg total) by mouth every 6 (six) hours as needed.     triamcinolone topical spray  Commonly known as:  KENALOG  Apply topically 2 (two) times a week. 1 application          Future Appointments Provider Department Dept Phone   09/27/2012 4:30 PM Loreli Slot, MD [facility] [phone]   10/10/2012 2:15 PM Storm Frisk, MD [facility] [phone]      Signed: Lowella Dandy 09/19/2012, 12:19 PM

## 2012-09-19 NOTE — Progress Notes (Signed)
      [address]       [name] [address]             [phone]      5 Days Post-Op Procedure(s) (LRB): VIDEO ASSISTED THORACOSCOPY (VATS)/WEDGE RESECTION (Right) LOBECTOMY (Right) THORACOTOMY/LOBECTOMY (Right) Subjective: No complaints this morning.  He feels good and is ready to be discharged home.  Objective: Vital signs in last 24 hours: Temp:  [97.8 F (36.6 C)-98.2 F (36.8 C)] 98.1 F (36.7 C) (06/30 0812) Pulse Rate:  [67-70] 68 (06/30 0812) Cardiac Rhythm:  [-] Normal sinus rhythm (06/30 0812) Resp:  [14-22] 19 (06/30 0812) BP: (124-142)/(59-73) 133/68 mmHg (06/30 0812) SpO2:  [95 %-99 %] 95 % (06/30 0812)  Intake/Output from previous day: 06/29 0701 - 06/30 0700 In: 1080 [P.O.:1080] Out: 1900 [Urine:1900] Intake/Output this shift: Total I/O In: 360 [P.O.:360] Out: 200 [Urine:200]  General appearance: alert, cooperative and no distress Neurologic: intact Heart: regular rate and rhythm Lungs: clear to auscultation bilaterally Abdomen: soft, non-tender; bowel sounds normal; no masses,  no organomegaly Extremities: extremities normal, atraumatic, no cyanosis or edema Wound: clean and dry  Lab Results:  Recent Labs  09/18/12 0500 09/19/12 0500  WBC 5.1 5.6  HGB 10.0* 10.1*  HCT 28.9* 29.0*  PLT 143* 155   BMET:  Recent Labs  09/18/12 0500 09/19/12 0500  NA 136 136  K 3.9 3.6  CL 103 99  CO2 29 29  GLUCOSE 109* 106*  BUN 16 14  CREATININE 0.91 0.84  CALCIUM 8.3* 8.5    PT/INR: No results found for this basename: LABPROT, INR,  in the last 72 hours ABG    Component Value Date/Time   PHART 7.390 09/15/2012 0411   HCO3 26.0* 09/15/2012 0411   TCO2 27 09/15/2012 0411   O2SAT 91.0 09/15/2012 0411   CBG (last 3)  No results found for this basename: GLUCAP,  in the last 72 hours  Assessment/Plan: S/P Procedure(s) (LRB): VIDEO ASSISTED THORACOSCOPY (VATS)/WEDGE RESECTION (Right) LOBECTOMY (Right) THORACOTOMY/LOBECTOMY  (Right)  1. CXR- stable, no change in hydropneumothorax, improvement of vascular congestion 2. Pulm- off oxygen, encouraged continued IS use at discharge 3. Dispo- patient doing well, will d/c home today   LOS: 5 days    Tony Prince, Denny Peon 09/19/2012

## 2012-09-19 NOTE — Progress Notes (Signed)
   CARE MANAGEMENT NOTE 09/19/2012  Patient:  SADARIUS, NORMAN   Account Number:  [id number]  Date Initiated:  09/16/2012  Documentation initiated by:  [facility]  Subjective/Objective Assessment:   post op VATS     Action/Plan:   Anticipated DC Date:  09/20/2012   Anticipated DC Plan:  HOME W HOME HEALTH SERVICES      DC Planning Services  CM consult      [facility] Choice  HOME HEALTH   Choice offered to / List presented to:  C-4 Adult Children        HH arranged  HH-1 RN      [facility] agency  [facility]   Status of service:  Completed, signed off Medicare Important Message given?   (If response is "NO", the following Medicare IM given date fields will be blank) Date Medicare IM given:   Date Additional Medicare IM given:    Discharge Disposition:  HOME W HOME HEALTH SERVICES  Per UR Regulation:    If discussed at Long Length of Stay Meetings, dates discussed:    Comments:  09/19/2012 1400 NCM spoke to pt and gave permission to speak to son. Provided pt with [facility] agency list. Son picked [facility] for [facility]. Referral to [facility] for [facility] RN for scheduled dc home today. Isidoro Donning RN CCM Case Mgmt phone [phone]  Contact:  Aron,[name] Daughter     [phone]                Aron,Chad Relative     [phone]

## 2012-09-19 NOTE — Progress Notes (Signed)
CSW reviewed record. Pt is to d/c home.   No assessment for SNF needed.   CSW signing off.   Leron Croak, LCSWA [facility] Emergency [facility].  [phone]

## 2012-09-19 NOTE — Progress Notes (Signed)
Discussed and explained to patient and son regarding discharge instructions, f/u appt., prescriptions,exit care note on thoracotomy given to son. Pt has no complaints of pain at this time. Beryle Quant

## 2012-09-19 NOTE — Evaluation (Signed)
Physical Therapy Evaluation Patient Details Name: Tony Prince MRN: [id number] DOB: 1932-06-15 Today's Date: 09/19/2012 Time: 4098-1191 PT Time Calculation (min): 24 min  PT Assessment / Plan / Recommendation History of Present Illness  Patient is a 79 y/o male s/p VATS for right UL mass.    Clinical Impression  Presents with deficits listed below and will benefit from HHPT upon d/c and son to assist as well as rolling walker for home use.  Patient educated in bed mobility for comfort due to incisional pain with mobility    PT Assessment  Patient needs continued PT services    Follow Up Recommendations  Home health PT;Supervision/Assistance - 24 hour          Equipment Recommendations  Rolling walker with 5" wheels    Recommendations for Other Services Other (comment) [facility])   Frequency Min 3X/week    Precautions / Restrictions Precautions Precautions: Fall   Pertinent Vitals/Pain C/o min discomfort in incision with bed mobility      Mobility  Bed Mobility Bed Mobility: Rolling Right;Sit to Supine;Rolling Left;Left Sidelying to Sit Rolling Left: 5: Supervision Left Sidelying to Sit: 5: Supervision;HOB flat Sit to Supine: 6: Modified independent (Device/Increase time) Details for Bed Mobility Assistance: cues for up from left sidelying with hugging pillow to right side for comfort Transfers Transfers: Sit to Stand;Stand to Sit Sit to Stand: From chair/3-in-1;5: Supervision;With armrests Stand to Sit: To chair/3-in-1;With upper extremity assist;5: Supervision Details for Transfer Assistance: no difficulties up from chair Ambulation/Gait Ambulation/Gait Assistance: 4: Min guard Ambulation Distance (Feet): 200 Feet Assistive device: Rolling walker Ambulation/Gait Assistance Details: stopped x 1 due to SpO2 down into 70's up in about 1 minutes or less with PLB on room air (not sure if sensor error,) Gait Pattern: Step-through pattern;Decreased stride length;Trunk  flexed        PT Diagnosis: Generalized weakness;Abnormality of gait  PT Problem List: Decreased strength;Decreased activity tolerance;Decreased mobility;Cardiopulmonary status limiting activity;Decreased knowledge of use of DME PT Treatment Interventions: DME instruction;Balance training;Gait training;Stair training;Functional mobility training;Patient/family education;Therapeutic activities;Therapeutic exercise     PT Goals(Current goals can be found in the care plan section) Acute Rehab PT Goals PT Goal Formulation: With patient Time For Goal Achievement: 09/23/12 Potential to Achieve Goals: Good  Visit Information  Last PT Received On: 09/19/12 Assistance Needed: +1 History of Present Illness: Patient is a 79 y/o male s/p VATS for right UL mass.         Prior Functioning  Home Living Family/patient expects to be discharged to:: Private residence Living Arrangements: Children Available Help at Discharge: Family Type of Home: House Home Access: Level entry Home Layout: Two level;Bed/bath upstairs Alternate Level Stairs-Number of Steps: 15 Alternate Level Stairs-Rails: Right Home Equipment: None Prior Function Level of Independence: Independent Communication Communication: No difficulties    Cognition  Cognition Arousal/Alertness: Awake/alert Behavior During Therapy: WFL for tasks assessed/performed Overall Cognitive Status: Within Functional Limits for tasks assessed    Extremity/Trunk Assessment Lower Extremity Assessment Lower Extremity Assessment: Overall WFL for tasks assessed   Balance Balance Balance Assessed: Yes Dynamic Standing Balance Dynamic Standing - Balance Support: No upper extremity supported Dynamic Standing - Level of Assistance: 5: Stand by assistance Dynamic Standing - Comments: walking around bed  End of Session PT - End of Session Equipment Utilized During Treatment: Gait belt Activity Tolerance: Patient tolerated treatment well Patient  left: in chair;with call bell/phone within reach  GP     [facility] 09/19/2012, 3:57 PM [place], PT [phone] 09/19/2012

## 2012-09-19 NOTE — Progress Notes (Signed)
PULMONARY  / CRITICAL CARE MEDICINE  Name: Tony Prince MRN: [id number] DOB: 11-01-1932    ADMISSION DATE:  09/14/2012 CONSULTATION DATE:  6/25   REFERRING MD :  Dr. Dorris Fetch  CHIEF COMPLAINT:  Post-Op Respiratory Failure  BRIEF PATIENT DESCRIPTION:  79 yo male with remote smoking hx presented with hemoptysis and found to have Rt upper lobe mass.  Referred to thoracic surgery.  Had non-diagnostic needle biopsy by IR.  S/P Rt VATS, extrapleural Rt upper lobectomy, and mediastinal LN dissection on 6/25.  PCCM asked to assist with post-op management.  SIGNIFICANT EVENTS:  6/25 - Admit for R VATS, lobectomy.   6/27 - Lovenox held for thrombocytopenia  STUDIES: 6/02 PFT >> FEV1 2.54 (82%), FEV1% 76, TLC 5.08 (70%), DLCO 55%  LINES / TUBES: R anterior CT 6/25>>> 6/27 R posterior CT 6/25>>> Rt radial A Line 6/25>>> Rt IJ CVL 6/25>>>  ANTIBIOTICS: Zinacef 6/25>>>6/26  SUBJECTIVE:  Had trouble sleeping last night >> hard to get comfortable.  Still sore when he coughs.  Using IS.  VITAL SIGNS: Temp:  [97.8 F (36.6 C)-98.2 F (36.8 C)] 98.1 F (36.7 C) (06/30 0812) Pulse Rate:  [65-70] 67 (06/30 0312) Resp:  [13-22] 20 (06/30 0312) BP: (101-142)/(51-73) 137/59 mmHg (06/30 0312) SpO2:  [92 %-99 %] 96 % (06/30 0312) Room air  INTAKE / OUTPUT: Intake/Output     06/29 0701 - 06/30 0700 06/30 0701 - 07/01 0700   P.O. 1080    I.V. (mL/kg)     Total Intake(mL/kg) 1080 (16.8)    Urine (mL/kg/hr) 1900 (1.2)    Total Output 1900     Net -820            PHYSICAL EXAMINATION: General: No distress, sitting in chair Neuro: Awake, calm, normal strength HEENT: No sinus tenderness Cardiovascular: regular Lungs: scattered rhonchi, no wheeze, Rt chest tubes in place Abdomen: soft, non tender Musculoskeletal: no edema Skin:  Warm/dry   LABS: CBC Recent Labs     09/17/12  0401  09/18/12  0500  09/19/12  0500  WBC  8.4  5.1  5.6  HGB  11.5*  10.0*  10.1*  HCT  33.7*   28.9*  29.0*  PLT  142*  143*  155    Coag's No results found for this basename: APTT, INR,  in the last 72 hours  BMET Recent Labs     09/17/12  0401  09/18/12  0500  09/19/12  0500  NA  131*  136  136  K  4.1  3.9  3.6  CL  98  103  99  CO2  23  29  29   BUN  20  16  14   CREATININE  0.92  0.91  0.84  GLUCOSE  145*  109*  106*    Electrolytes Recent Labs     09/17/12  0401  09/18/12  0500  09/19/12  0500  CALCIUM  8.4  8.3*  8.5    Sepsis Markers No results found for this basename: LACTICACIDVEN, PROCALCITON, O2SATVEN,  in the last 72 hours  ABG No results found for this basename: PHART, PCO2ART, PO2ART,  in the last 72 hours  Liver Enzymes No results found for this basename: AST, ALT, ALKPHOS, BILITOT, ALBUMIN,  in the last 72 hours  Cardiac Enzymes No results found for this basename: TROPONINI, PROBNP,  in the last 72 hours  Glucose No results found for this basename: GLUCAP,  in the last 72 hours  Imaging Dg  Chest 2 View  09/19/2012   *RADIOLOGY REPORT*  Clinical Data: Removal of right chest tube  CHEST - 2 VIEW  Comparison: Portable chest x-ray of 09/18/2012  Findings: After removal of the right chest tube, there is no change in the apparently loculated right apical hydropneumothorax.  There has been some improvement in the degree of pulmonary vascular congestion.  Right central venous line remains and heart size is stable.  IMPRESSION:  1.  Right chest tube removed. 2.  Little change in right apical hydropneumothorax. 3.  Some improvement in pulmonary vascular congestion.   Original Report Authenticated By: Dwyane Dee, M.D.   Dg Chest Port 1 View  09/18/2012   *RADIOLOGY REPORT*  Clinical Data: Postop, check right chest tube.  PORTABLE CHEST - 1 VIEW  Comparison: 09/17/2012.  Findings: The position of the right thoracic tube is unchanged. The right apical pneumothorax is somewhat smaller.  The visceral line of the right lung now projects across the right fourth  rib.    The position of the right IJ catheter is unchanged.  The heart size remains normal.  Vascular congestion and mild edema still persists and fat.  slightly more prominent on today's study.  There are no focal abnormalities.  No definite effusions.  No acute bony changes.  IMPRESSION: Decrease in volume of right pneumothorax. Slightly increased interstitial thickening  consistent with mild edema  No other interval changes.   Original Report Authenticated By: Sander Radon, M.D.      ASSESSMENT / PLAN:  S/P R VATS / Lobectomy for apical mass>>prelim NO CA, just abscess formation and necrosis. Final report not yet issued.  P:   Poss d/c home today I can /will f/u this pt as an outpt   Pain control. P: -per TCTS  Hx of psoriasis. P: -topical creams as needed  Pulm will sign off , however I will f/u in office.  Doubt additional Rx will be needed   [facility] Pulmonary/Critical Care 09/19/2012, 8:28 AM Beeper  [phone]  Cell  [phone]  If no response or cell goes to voicemail, call beeper [phone]

## 2012-09-20 ENCOUNTER — Telehealth: Payer: Self-pay | Admitting: *Deleted

## 2012-09-20 ENCOUNTER — Encounter (HOSPITAL_COMMUNITY): Payer: Self-pay | Admitting: Emergency Medicine

## 2012-09-20 DIAGNOSIS — Z9889 Other specified postprocedural states: Secondary | ICD-10-CM

## 2012-09-20 DIAGNOSIS — T8131XA Disruption of external operation (surgical) wound, not elsewhere classified, initial encounter: Secondary | ICD-10-CM | POA: Insufficient documentation

## 2012-09-20 DIAGNOSIS — Y838 Other surgical procedures as the cause of abnormal reaction of the patient, or of later complication, without mention of misadventure at the time of the procedure: Secondary | ICD-10-CM | POA: Insufficient documentation

## 2012-09-20 MED ORDER — ONDANSETRON 4 MG PO TBDP: 4.0000 mg | ORAL_TABLET | Freq: Three times a day (TID) | ORAL | Status: DC | PRN

## 2012-09-20 NOTE — ED Notes (Signed)
PT. REPORTS INCREASING  DRAINAGE ( SEROSANGUINOUS) AT THORACOTOMY SITE AT RIGHT CHEST ( SURGERY 2 DAYS AGO - LUNG MASS ) THIS EVENING , RESPIRATIONS UNLABORED . DENIES PAIN OR DISCOMFORT. AMBULATORY.

## 2012-09-20 NOTE — Telephone Encounter (Signed)
His home health nurse called with his continued nausea since d/c yesterday.  It comes and goes.  I called and discussed this with him.  I questioned him regarding his bowels.  He went yesterday before d/c, but it was very hard to go with a lot of straining.  I suggested taking Colace everyday until he returns to his normal schedule. He said the Zofran worked well in the hospital.

## 2012-09-21 ENCOUNTER — Emergency Department (HOSPITAL_COMMUNITY)
Admission: EM | Admit: 2012-09-21 | Discharge: 2012-09-21 | Discharge status: Against Medical Advice | Payer: Medicare PPO | Attending: Emergency Medicine | Admitting: Emergency Medicine

## 2012-09-21 ENCOUNTER — Other Ambulatory Visit: Payer: Self-pay | Admitting: *Deleted

## 2012-09-21 ENCOUNTER — Telehealth: Payer: Self-pay | Admitting: *Deleted

## 2012-09-21 DIAGNOSIS — R918 Other nonspecific abnormal finding of lung field: Secondary | ICD-10-CM

## 2012-09-21 NOTE — ED Notes (Signed)
Pt did not answer when called

## 2012-09-21 NOTE — ED Notes (Signed)
Pt did not answer x 2 

## 2012-09-21 NOTE — Telephone Encounter (Signed)
Tony Prince called with concerns of a large amount of fluid that came out of his chest tube site after a coughing spell last night.  He stated that there isn't any drainage now.  We advised that his home health nurse call us after her visit today to assess the situation.  [name] from [facility]  called and reported that vital signs are stable, he is afebrile.  There has been no further drainage from the chest tube site.  The site is slightly red and raw, but without signs of infection. His son also called with concerns.  I reviewed with both of them to keep the area clean and dry and watch for signs /symptoms of infection.  The understood.  I reassured them that it is better for the fluid to drain than not.

## 2012-09-26 ENCOUNTER — Other Ambulatory Visit: Payer: Self-pay | Admitting: Thoracic Surgery (Cardiothoracic Vascular Surgery)

## 2012-09-27 ENCOUNTER — Ambulatory Visit
Admission: RE | Admit: 2012-09-27 | Discharge: 2012-09-27 | Disposition: A | Discharge status: Home / Self Care | Payer: Medicare PPO | Source: Ambulatory Visit | Attending: Thoracic Surgery (Cardiothoracic Vascular Surgery) | Admitting: Thoracic Surgery (Cardiothoracic Vascular Surgery)

## 2012-09-27 ENCOUNTER — Encounter: Payer: Self-pay | Admitting: Thoracic Surgery (Cardiothoracic Vascular Surgery)

## 2012-09-27 ENCOUNTER — Ambulatory Visit (INDEPENDENT_AMBULATORY_CARE_PROVIDER_SITE_OTHER): Payer: Self-pay | Admitting: Thoracic Surgery (Cardiothoracic Vascular Surgery)

## 2012-09-27 VITALS — BP 138/74 | HR 64 | Temp 96.7°F | Resp 16 | Ht 71.0 in | Wt 144.0 lb

## 2012-09-27 DIAGNOSIS — Z902 Acquired absence of lung [part of]: Secondary | ICD-10-CM

## 2012-09-27 DIAGNOSIS — Z9889 Other specified postprocedural states: Secondary | ICD-10-CM

## 2012-09-27 DIAGNOSIS — Z09 Encounter for follow-up examination after completed treatment for conditions other than malignant neoplasm: Secondary | ICD-10-CM

## 2012-09-27 DIAGNOSIS — R918 Other nonspecific abnormal finding of lung field: Secondary | ICD-10-CM

## 2012-09-27 DIAGNOSIS — J8410 Pulmonary fibrosis, unspecified: Secondary | ICD-10-CM

## 2012-09-27 MED ORDER — CEPHALEXIN 500 MG PO CAPS: 500.0000 mg | ORAL_CAPSULE | Freq: Three times a day (TID) | ORAL | Status: DC

## 2012-09-27 NOTE — Progress Notes (Signed)
HPI:  [name] is a 79 year old gentleman who underwent a right thoracotomy and right upper lobectomy with chest wall resection for an apical mass on June 25. Pathology showed atypical cells, extensive inflammation and necrosis, but no definite cancer. [name] postoperative course was uncomplicated.  He says that currently his pain is pretty well controlled with tramadol. He has not taken the oxycodone in over a week. He has not had any fevers or chills. He does tire easily and feels that his breathing is not as good as it was for surgery. He also complains of pain in his epigastrium when he eats and tenderness on the anterior abdominal wall.  Past Medical History  Diagnosis Date  . Lung mass   . BPH (benign prostatic hyperplasia)   . Psoriasis   . Osteoporosis   . Anxiety   . Hiatal hernia   . Positional vertigo   . Prolapse of mitral valve     click  . Sebaceous cyst     left shoulder  . Allergic rhinitis   . CAD (coronary artery disease) 08/12/12    LAD calcification noted on CT  . GERD (gastroesophageal reflux disease)     currently not using antacid, hasn't for approx. one yr.   . History of stress test 08/2012    told that it was wnl  . Heart murmur     told that he has murmur by Dr. Pete Glatter   . Sinusitis     frequently  . Inguinal hernia     left side   . Lung cancer   . Exertional shortness of breath     "last 8-11yrs" (09/14/2012)  . Arthritis     pt. reports that he has aches & pain, unsure if its related to degenerative process         Current Outpatient Prescriptions  Medication Sig Dispense Refill  . Calcium Citrate-Vitamin D 315-250 MG-UNIT TABS Take by mouth daily.      . Colloidal Oatmeal (AVEENO ECZEMA THERAPY) 1 % CREA Apply 1 application topically daily as needed (eczema).      . Eyelid Cleansers (SYSTANE LID WIPES EX) Apply 1 application topically every morning. Apply externally to both eyes.      . fish oil-omega-3 fatty acids 1000 MG  capsule Take 2 g by mouth daily.       . Hydrocortisone Butyr Lipo Base 0.1 % CREA Apply 1 application topically as needed. psoriasis      . ibuprofen (ADVIL,MOTRIN) 800 MG tablet Take 800 mg by mouth every 8 hours for 72 hrs, then take 800 mg every 8 hours as needed for pain  30 tablet  0  . LORazepam (ATIVAN) 0.5 MG tablet Take 0.5 mg by mouth at bedtime.       . Multiple Vitamins-Minerals (ICAPS PO) Take 2 tablets by mouth daily.       . ondansetron (ZOFRAN ODT) 4 MG disintegrating tablet Take 1 tablet (4 mg total) by mouth every 8 (eight) hours as needed for nausea.  10 tablet  0  . Polyethyl Glycol-Propyl Glycol (SYSTANE ULTRA) 0.4-0.3 % SOLN Apply 1 drop to eye 2 (two) times daily as needed (dry eyes).      . traMADol (ULTRAM) 50 MG tablet TAKE 1-2 TABLETS BY MOUTH EVERY 6 HOURS AS NEEDED  50 tablet  0  . triamcinolone (KENALOG) topical spray Apply topically 2 (two) times a week. 1 application      . cephALEXin (KEFLEX) 500 MG capsule  Take 1 capsule (500 mg total) by mouth 3 (three) times daily.  21 capsule  0  . oxyCODONE-acetaminophen (PERCOCET/ROXICET) 5-325 MG per tablet Take 1-2 tablets by mouth every 4 (four) hours as needed.  30 tablet  0   No current facility-administered medications for this visit.    Physical Exam BP 138/74  Pulse 64  Temp(Src) 96.7 F (35.9 C) (Oral)  Resp 16  Ht 5\' 11"  (1.803 m)  Wt 144 lb (65.318 kg)  BMI 20.09 kg/m2  SpO13 60% 79 year old male in no acute distress Lungs clear with equal breath sounds bilaterally Incision with erythema and induration, also erythema chest tube site Tenderness to palpation at the xiphoid process, no hernia palpated  Diagnostic Tests: Chest x-ray shows postoperative changes with a small apical space  Impression: [name] is a 79 year old gentleman is now about 2 weeks out from a right upper lobectomy. This turned out to be a mystery. He had atypical cells on frozen section which were highly suspicious for non-small  cell carcinoma. However on final pathology no definite area of non-small cell carcinoma was identified although there were abundant atypical cells. I think the most likely scenario is that this was an infectious/inflammatory mass. This was likely that this was a cancer that her progress so rapidly and has such intense inflammatory reaction and there was no residual viable tumor seen. However I do think given the unusual nature of this lesion that long-term followup would be appropriate.  In the short-term I think he is doing about as well as you would expect after a upper lobectomy with extrapleural resection. His pain is reasonably well controlled with tramadol. Advised him that his appetite should improve over the next few weeks. His sleeping habits should improve as well. I suspect that the pain in the epigastrium is related to his incision. The only questionable thing is that there seems to be some relation to food intake. However the pain seems localized to the abdominal wall and there is tenderness of the abdominal wall which would be very unusual with peptic ulcer disease.  He does appear to have cellulitis of the wound. We will start him on Keflex 500 mg by mouth 3 times a day for 7 days.  Plan: Keflex 500 mg by mouth 3 times a day x7 days  Return in 2 weeks with repeat chest x-ray to check on progress

## 2012-09-29 ENCOUNTER — Ambulatory Visit (INDEPENDENT_AMBULATORY_CARE_PROVIDER_SITE_OTHER): Payer: Self-pay | Admitting: Physician Assistant

## 2012-09-29 VITALS — BP 140/83 | HR 69 | Temp 96.9°F | Resp 20 | Ht 71.0 in | Wt 144.0 lb

## 2012-09-29 DIAGNOSIS — Z09 Encounter for follow-up examination after completed treatment for conditions other than malignant neoplasm: Secondary | ICD-10-CM

## 2012-09-29 NOTE — Progress Notes (Signed)
[address]       Tony Prince [id number]             [phone]          HPI: [name] is status post right thoracotomy/right upper lobectomy by Dr. Dorris Prince on 09/14/2012.  He returned for followup 2 days ago, and at that time, was noted to have a mild cellulitis of the incision site.  He was placed on Keflex and scheduled for recheck in 2 weeks. His chest tube sutures were removed at the visit, and the site was noted to be erythematous as well.  The patient presents today with drainage from his chest tube site.  He states that about a week ago, the area began to drain a copious amount of serous fluid, to the point that he went to the ER for evaluation.  He waited for 3 hours and left without being seen.  The drainage ultimately resolved, and had not recurred until last night. At that time, he had a bad coughing bout, and since then, it has drained continuously, soaking dressings within a few minutes. He denies fever or chills, and has not noted any purulence.  He states that the problem is becoming unmanageable, and he comes in today for further evaluation.    Current Outpatient Prescriptions  Medication Sig Dispense Refill  . Calcium Citrate-Vitamin D 315-250 MG-UNIT TABS Take by mouth daily.      . cephALEXin (KEFLEX) 500 MG capsule Take 1 capsule (500 mg total) by mouth 3 (three) times daily.  21 capsule  0  . Colloidal Oatmeal (AVEENO ECZEMA THERAPY) 1 % CREA Apply 1 application topically daily as needed (eczema).      . Eyelid Cleansers (SYSTANE LID WIPES EX) Apply 1 application topically every morning. Apply externally to both eyes.      . fish oil-omega-3 fatty acids 1000 MG capsule Take 2 g by mouth daily.       . Hydrocortisone Butyr Lipo Base 0.1 % CREA Apply 1 application topically as needed. psoriasis      . ibuprofen (ADVIL,MOTRIN) 800 MG tablet Take 800 mg by mouth every 8 hours for 72 hrs, then take 800 mg every 8 hours as needed for pain  30  tablet  0  . LORazepam (ATIVAN) 0.5 MG tablet Take 0.5 mg by mouth at bedtime.       . Multiple Vitamins-Minerals (ICAPS PO) Take 2 tablets by mouth daily.       . ondansetron (ZOFRAN ODT) 4 MG disintegrating tablet Take 1 tablet (4 mg total) by mouth every 8 (eight) hours as needed for nausea.  10 tablet  0  . oxyCODONE-acetaminophen (PERCOCET/ROXICET) 5-325 MG per tablet Take 1-2 tablets by mouth every 4 (four) hours as needed.  30 tablet  0  . Polyethyl Glycol-Propyl Glycol (SYSTANE ULTRA) 0.4-0.3 % SOLN Apply 1 drop to eye 2 (two) times daily as needed (dry eyes).      . traMADol (ULTRAM) 50 MG tablet TAKE 1-2 TABLETS BY MOUTH EVERY 6 HOURS AS NEEDED  50 tablet  0  . triamcinolone (KENALOG) topical spray Apply topically 2 (two) times a week. 1 application       No current facility-administered medications for this visit.     Physical Exam: BP 140/83 HR 69 Resp 20 Wounds: Thoracotomy incision is erythematous.  The posterior chest tube site is also erythematous and macerated.  There is a constant stream of  serous fluid, which significantly increases in volume with Valsalva.  Dr. Donata Prince also evaluated the area and probed it with a Q-tip.  There was no purulence noted. Lungs: Clear    Diagnostic Tests: Chest xray: Dg Chest 2 View  09/27/2012   *RADIOLOGY REPORT*  Clinical Data: Post lung surgery  CHEST - 2 VIEW  Comparison: 09/19/2012  Findings: Cardiomediastinal silhouette is unremarkable.  Stable right upper loculated hydropneumothorax.  Right IJ central line has been removed. No acute infiltrate or pulmonary edema.  Probable small bilateral pleural effusion.   IMPRESSION: Stable right upper loculated hydropneumothorax.  Right IJ central line has been removed. No acute infiltrate or pulmonary edema. Probable small bilateral pleural effusion.   Original Report Authenticated By: Tony Prince, M.D.       Assessment/Plan: Dr. Donata Prince also saw the patient and recommended that he cover  the site with an ostomy bag to help collect the drainage.  The patient was instructed on management, and we will also get his home health RN to assist with emptying and measuring the drainage.  He is to continue Keflex, and encouraged to increase his protein intake.  He will keep his scheduled appointment with Dr. Dorris Prince, but may call in the interim if he has any further problems.

## 2012-09-30 ENCOUNTER — Encounter: Payer: Self-pay | Admitting: Thoracic Surgery (Cardiothoracic Vascular Surgery)

## 2012-09-30 ENCOUNTER — Ambulatory Visit (INDEPENDENT_AMBULATORY_CARE_PROVIDER_SITE_OTHER): Payer: Self-pay | Admitting: Thoracic Surgery (Cardiothoracic Vascular Surgery)

## 2012-09-30 ENCOUNTER — Ambulatory Visit
Admission: RE | Admit: 2012-09-30 | Discharge: 2012-09-30 | Disposition: A | Discharge status: Home / Self Care | Payer: Medicare PPO | Source: Ambulatory Visit | Attending: Thoracic Surgery (Cardiothoracic Vascular Surgery) | Admitting: Thoracic Surgery (Cardiothoracic Vascular Surgery)

## 2012-09-30 ENCOUNTER — Other Ambulatory Visit: Payer: Self-pay | Admitting: *Deleted

## 2012-09-30 VITALS — BP 121/78 | HR 73 | Resp 18 | Ht 71.0 in | Wt 144.0 lb

## 2012-09-30 DIAGNOSIS — C3410 Malignant neoplasm of upper lobe, unspecified bronchus or lung: Secondary | ICD-10-CM

## 2012-09-30 DIAGNOSIS — Z902 Acquired absence of lung [part of]: Secondary | ICD-10-CM

## 2012-09-30 DIAGNOSIS — R918 Other nonspecific abnormal finding of lung field: Secondary | ICD-10-CM

## 2012-09-30 DIAGNOSIS — J8410 Pulmonary fibrosis, unspecified: Secondary | ICD-10-CM

## 2012-09-30 DIAGNOSIS — Z9889 Other specified postprocedural states: Secondary | ICD-10-CM

## 2012-09-30 DIAGNOSIS — R222 Localized swelling, mass and lump, trunk: Secondary | ICD-10-CM

## 2012-09-30 NOTE — Progress Notes (Signed)
Patient ID: Tony Prince, male   DOB: 01-May-1932, 79 y.o.   MRN: [id number]   Tony Prince is a 79 year old gentleman who underwent a right upper lobectomy on June 25. His postoperative course was uncomplicated he was discharged on June 30. I saw him in the office on July 8 at which time he was doing well. Yesterday morning he had a coughing spell and expelled a large amount of straw-colored fluid from his chest tube site. This had happened once before about a week prior to this more recent incident.  He came to the office yesterday and was seen by Dr. Donata Clay. An ostomy bag was placed over the chest tube site. He had a large amount of leakage around the ostomy bag this morning and returns now to check on the status of his wound.  He's not had any fevers or chills or shortness of breath. He prefers the ostomy bag to dressing changes as he has difficulty reaching the site and applying a new dressing.  On exam his incision is well-healed. His posterior chest tube site is open with some surrounding erythema. There is no drainage of present  His chest x-ray today shows no change from the film on 09/27/12. There are postoperative changes with a very small right effusion.  Overall Tony Prince is stable. He is very thin and I suspect that is the primary reason his pleural fluid is draining through the chest tube site, as there is relatively little tissue between the skin and pleural space. We have reapplied an ostomy bag as per his preference. We will contact home health to make sure they know that they need to see him over the weekend.  I will plan to see him back next week

## 2012-10-04 ENCOUNTER — Ambulatory Visit (INDEPENDENT_AMBULATORY_CARE_PROVIDER_SITE_OTHER): Payer: Self-pay | Admitting: Thoracic Surgery (Cardiothoracic Vascular Surgery)

## 2012-10-04 ENCOUNTER — Encounter: Payer: Self-pay | Admitting: Thoracic Surgery (Cardiothoracic Vascular Surgery)

## 2012-10-04 VITALS — BP 142/85 | HR 71 | Resp 16 | Ht 71.0 in | Wt 144.0 lb

## 2012-10-04 DIAGNOSIS — J8410 Pulmonary fibrosis, unspecified: Secondary | ICD-10-CM

## 2012-10-04 DIAGNOSIS — Z902 Acquired absence of lung [part of]: Secondary | ICD-10-CM

## 2012-10-04 DIAGNOSIS — Z9889 Other specified postprocedural states: Secondary | ICD-10-CM

## 2012-10-04 DIAGNOSIS — R918 Other nonspecific abnormal finding of lung field: Secondary | ICD-10-CM

## 2012-10-04 DIAGNOSIS — R222 Localized swelling, mass and lump, trunk: Secondary | ICD-10-CM

## 2012-10-04 NOTE — Progress Notes (Signed)
HPI:  [name] is today for followup. He had a right upper lobectomy which was extrapleural at the apex and mediastinal node dissection on June 25. Pathology showed intense inflammatory reaction and necrosis. There were some atypical cells but no definite cancer.  He was seen in the office last week for drainage from his chest tube site. An ostomy bag was placed over the chest tube site he returns today for a followup.  He says that the bag is remain sealed and he has not had to empty it since it was placed on Friday. He is having some incisional pain which is reasonably well-controlled with the Ultram. He is taking one Ultram tablet 3 times a day.  He does get short of breath he walks upstairs. The swelling in his legs has resolved.  Past Medical History  Diagnosis Date  . Lung mass   . BPH (benign prostatic hyperplasia)   . Psoriasis   . Osteoporosis   . Anxiety   . Hiatal hernia   . Positional vertigo   . Prolapse of mitral valve     click  . Sebaceous cyst     left shoulder  . Allergic rhinitis   . CAD (coronary artery disease) 08/12/12    LAD calcification noted on CT  . GERD (gastroesophageal reflux disease)     currently not using antacid, hasn't for approx. one yr.   . History of stress test 08/2012    told that it was wnl  . Heart murmur     told that he has murmur by Dr. Pete Prince   . Sinusitis     frequently  . Inguinal hernia     left side   . Lung cancer   . Exertional shortness of breath     "last 8-92yrs" (09/14/2012)  . Arthritis     pt. reports that he has aches & pain, unsure if its related to degenerative process         Current Outpatient Prescriptions  Medication Sig Dispense Refill  . Calcium Citrate-Vitamin D 315-250 MG-UNIT TABS Take by mouth daily.      . cephALEXin (KEFLEX) 500 MG capsule Take 1 capsule (500 mg total) by mouth 3 (three) times daily.  21 capsule  0  . Colloidal Oatmeal (AVEENO ECZEMA THERAPY) 1 % CREA Apply 1 application  topically daily as needed (eczema).      . Eyelid Cleansers (SYSTANE LID WIPES EX) Apply 1 application topically every morning. Apply externally to both eyes.      . fish oil-omega-3 fatty acids 1000 MG capsule Take 2 g by mouth daily.       . Hydrocortisone Butyr Lipo Base 0.1 % CREA Apply 1 application topically as needed. psoriasis      . ibuprofen (ADVIL,MOTRIN) 800 MG tablet Take 800 mg by mouth every 8 hours for 72 hrs, then take 800 mg every 8 hours as needed for pain  30 tablet  0  . LORazepam (ATIVAN) 0.5 MG tablet Take 0.5 mg by mouth at bedtime.       . Multiple Vitamins-Minerals (ICAPS PO) Take 2 tablets by mouth daily.       . ondansetron (ZOFRAN ODT) 4 MG disintegrating tablet Take 1 tablet (4 mg total) by mouth every 8 (eight) hours as needed for nausea.  10 tablet  0  . oxyCODONE-acetaminophen (PERCOCET/ROXICET) 5-325 MG per tablet Take 1-2 tablets by mouth every 4 (four) hours as needed.  30 tablet  0  . Polyethyl  Glycol-Propyl Glycol (SYSTANE ULTRA) 0.4-0.3 % SOLN Apply 1 drop to eye 2 (two) times daily as needed (dry eyes).      . traMADol (ULTRAM) 50 MG tablet TAKE 1-2 TABLETS BY MOUTH EVERY 6 HOURS AS NEEDED  50 tablet  0  . triamcinolone (KENALOG) topical spray Apply topically 2 (two) times a week. 1 application       No current facility-administered medications for this visit.    Physical Exam BP 142/85  Pulse 71  Resp 16  Ht 5\' 11"  (1.803 m)  Wt 144 lb (65.318 kg)  BMI 20.09 kg/m2  SpO38 87% 79 year old male in no acute distress Equal breath sounds bilaterally Cardiac regular rate and rhythm normal S1 and S2 Ostomy bag in place over chest tube site. Incision with mild erythema. No drainage.  Diagnostic Tests: None today  Impression: [name] is doing well at this point in time. He is still having some incisional discomfort which is to be expected. He is taking one Ultram tablet 3 times a day. I told him that he could supplement that with acetaminophen as  necessary. He has begun driving on a limited basis. He understands that he should not drive he takes more than 1 Ultram tablet. He also should remain very cautious with his driving and limit himself to very short trips for now.  He likely does not need to keep the bag over the chest tube site. However he is more comfortable having in place, so we will leave it for now.  Plan: Return in one week for scheduled followup visit with PA and lateral chest x-ray

## 2012-10-06 ENCOUNTER — Other Ambulatory Visit: Payer: Self-pay | Admitting: *Deleted

## 2012-10-06 DIAGNOSIS — R918 Other nonspecific abnormal finding of lung field: Secondary | ICD-10-CM

## 2012-10-10 ENCOUNTER — Ambulatory Visit (INDEPENDENT_AMBULATORY_CARE_PROVIDER_SITE_OTHER): Payer: Medicare PPO | Admitting: Critical Care Medicine

## 2012-10-10 ENCOUNTER — Encounter: Payer: Self-pay | Admitting: Critical Care Medicine

## 2012-10-10 VITALS — BP 126/74 | HR 70 | Temp 97.6°F | Ht 71.0 in | Wt 137.2 lb

## 2012-10-10 DIAGNOSIS — Z9889 Other specified postprocedural states: Secondary | ICD-10-CM

## 2012-10-10 DIAGNOSIS — J850 Gangrene and necrosis of lung: Secondary | ICD-10-CM

## 2012-10-10 DIAGNOSIS — J852 Abscess of lung without pneumonia: Secondary | ICD-10-CM

## 2012-10-10 DIAGNOSIS — R0609 Other forms of dyspnea: Secondary | ICD-10-CM

## 2012-10-10 DIAGNOSIS — R0600 Dyspnea, unspecified: Secondary | ICD-10-CM

## 2012-10-10 DIAGNOSIS — J984 Other disorders of lung: Secondary | ICD-10-CM

## 2012-10-10 DIAGNOSIS — R0989 Other specified symptoms and signs involving the circulatory and respiratory systems: Secondary | ICD-10-CM

## 2012-10-10 NOTE — Progress Notes (Signed)
Subjective:    Patient ID: Tony Prince, male    DOB: [birth year], 79 y.o.   MRN: [id number]  HPI  79 y.o. WM RUL mass prob CA This patient presented with an episode of cough and hemoptysis 3 weeks prior to this visit. The patient had chest x-ray which showed increased density right upper lobe and the patient is an underwent CT scan of chest which showed probable malignancy right upper lobe. The patient saw thoracic surgery and he recommended a needle biopsy which was nondiagnostic. The patient is now been recommended surgical approach and the patient comes to this office for a second opinion on the possibility of a surgical approach.  Not coughing up any blood last few days. Pt has pndrip Prior sinus infection issues.  Notes some dyspnea with exertion, had this for 7 yrs   10/10/2012 Chief Complaint  Patient presents with  . HFU    Breathing has worsened since having surgery.  Has SOB with any kind of activity, chest pain, PND, and prod cough with clear mucus.  Pt with more dyspnea.  Notes more cough, lots of pndrip. No hemoptysis.  Mucus now is clear.  Drainage from the wound.  Wound has slowed down, has ostomy bag.  Chest tube site.  Past Medical History  Diagnosis Date  . Lung mass   . BPH (benign prostatic hyperplasia)   . Psoriasis   . Osteoporosis   . Anxiety   . Hiatal hernia   . Positional vertigo   . Prolapse of mitral valve     click  . Sebaceous cyst     left shoulder  . Allergic rhinitis   . CAD (coronary artery disease) 08/12/12    LAD calcification noted on CT  . GERD (gastroesophageal reflux disease)     currently not using antacid, hasn't for approx. one yr.   . History of stress test 08/2012    told that it was wnl  . Heart murmur     told that he has murmur by Dr. Pete Glatter   . Sinusitis     frequently  . Inguinal hernia     left side   . Lung cancer   . Exertional shortness of breath     "last 8-63yrs" (09/14/2012)  . Arthritis     pt. reports that  he has aches & pain, unsure if its related to degenerative process        Family History  Problem Relation Age of Onset  . Hyperlipidemia Father   . Hypertension Father   . Diabetes Father   . Colon polyps Mother   . Heart disease Father   . Heart disease Brother     #1  . Heart disease Brother     #2     History   Social History  . Marital Status: Widowed    Spouse Name: N/A    Number of Children: 2  . Years of Education: N/A   Occupational History  . professor phyloscopy and religion     at [employer]   Social History Main Topics  . Smoking status: Former Smoker -- 10 years    Types: Pipe    Quit date: 03/23/1968  . Smokeless tobacco: Never Used  . Alcohol Use: Yes     Comment: 09/14/2012 "less than once per week he'll have a drink"  . Drug Use: No  . Sexually Active: Not on file   Other Topics Concern  . Not on file   Social History  Narrative  . No narrative on file     No Known Allergies   Outpatient Prescriptions Prior to Visit  Medication Sig Dispense Refill  . Calcium Citrate-Vitamin D 315-250 MG-UNIT TABS Take by mouth daily.      . Colloidal Oatmeal (AVEENO ECZEMA THERAPY) 1 % CREA Apply 1 application topically daily as needed (eczema).      . Eyelid Cleansers (SYSTANE LID WIPES EX) Apply 1 application topically every morning. Apply externally to both eyes.      . fish oil-omega-3 fatty acids 1000 MG capsule Take 2 g by mouth daily.       . Hydrocortisone Butyr Lipo Base 0.1 % CREA Apply 1 application topically as needed. psoriasis      . ibuprofen (ADVIL,MOTRIN) 800 MG tablet Take 800 mg by mouth every 8 hours for 72 hrs, then take 800 mg every 8 hours as needed for pain  30 tablet  0  . LORazepam (ATIVAN) 0.5 MG tablet Take 0.5 mg by mouth at bedtime.       . Multiple Vitamins-Minerals (ICAPS PO) Take 2 tablets by mouth daily.       . ondansetron (ZOFRAN ODT) 4 MG disintegrating tablet Take 1 tablet (4 mg total) by mouth every 8 (eight) hours as  needed for nausea.  10 tablet  0  . Polyethyl Glycol-Propyl Glycol (SYSTANE ULTRA) 0.4-0.3 % SOLN Apply 1 drop to eye 2 (two) times daily as needed (dry eyes).      . traMADol (ULTRAM) 50 MG tablet TAKE 1-2 TABLETS BY MOUTH EVERY 6 HOURS AS NEEDED  50 tablet  0  . triamcinolone (KENALOG) topical spray Apply topically 2 (two) times a week. 1 application      . oxyCODONE-acetaminophen (PERCOCET/ROXICET) 5-325 MG per tablet Take 1-2 tablets by mouth every 4 (four) hours as needed.  30 tablet  0  . cephALEXin (KEFLEX) 500 MG capsule Take 1 capsule (500 mg total) by mouth 3 (three) times daily.  21 capsule  0   No facility-administered medications prior to visit.     Review of Systems  Constitutional: Positive for fatigue. Negative for fever, chills, diaphoresis, activity change, appetite change and unexpected weight change.  HENT: Positive for congestion, rhinorrhea, neck pain, neck stiffness, postnasal drip and sinus pressure. Negative for hearing loss, ear pain, nosebleeds, sore throat, facial swelling, sneezing, mouth sores, trouble swallowing, dental problem, voice change, tinnitus and ear discharge.   Eyes: Negative for photophobia, discharge, itching and visual disturbance.  Respiratory: Positive for cough, chest tightness and shortness of breath. Negative for apnea, choking, wheezing and stridor.   Cardiovascular: Negative for chest pain, palpitations and leg swelling.  Gastrointestinal: Negative for nausea, vomiting, abdominal pain, constipation, blood in stool and abdominal distention.  Genitourinary: Negative for dysuria, urgency, frequency, hematuria, flank pain, decreased urine volume and difficulty urinating.  Musculoskeletal: Negative for myalgias, back pain, joint swelling, arthralgias and gait problem.  Skin: Negative for color change, pallor and rash.  Neurological: Negative for dizziness, tremors, seizures, syncope, speech difficulty, weakness, light-headedness, numbness and  headaches.  Hematological: Negative for adenopathy. Does not bruise/bleed easily.  Psychiatric/Behavioral: Negative for confusion, sleep disturbance and agitation. The patient is not nervous/anxious.        Objective:   Physical Exam  Filed Vitals:   10/10/12 1350  BP: 126/74  Pulse: 70  Temp: 97.6 F (36.4 C)  TempSrc: Oral  Height: 5\' 11"  (1.803 m)  Weight: 137 lb 3.2 oz (62.234 kg)  SpO2: 98%  Gen: Pleasant, well-nourished, in no distress,  normal affect  ENT: No lesions,  mouth clear,  oropharynx clear, no postnasal drip  Neck: No JVD, no TMG, no carotid bruits  Lungs: No use of accessory muscles, no dullness to percussion, clear without rales or rhonchi  Cardiovascular: RRR, heart sounds normal, no murmur or gallops, no peripheral edema  Abdomen: soft and NT, no HSM,  BS normal  Musculoskeletal: No deformities, no cyanosis or clubbing  Neuro: alert, non focal  Skin: Warm, no lesions or rashes  Dg Chest 2 View  10/11/2012   *RADIOLOGY REPORT*  Clinical Data: Follow-up evaluation of lung mass.  CHEST - 2 VIEW  Comparison: Chest x-ray 09/30/2012.  Findings: Again noted is a small loculated hydropneumothorax in the superior aspect of the right hemithorax.  Small right pleural effusion dependently.  Linear opacities in the right base favored to represent areas of scarring.  No definite acute consolidative air space disease.  No evidence of pulmonary edema.  Heart size is normal.  Upper mediastinal contours are within normal limits. Postoperative changes of right upper lobectomy are again noted.  IMPRESSION: 1.  Slight decrease in size of the loculated hydropneumothorax in the apex of the right hemithorax. 2.  Status post right upper lobectomy with mild scarring at the right base and a small chronic right-sided pleural effusion layering dependently which is unchanged.   Original Report Authenticated By: Trudie Reed, M.D.    Restrictive lung defect seen on pulmonary  function studies      Assessment & Plan:   Pulmonary necrosis RUL s/p lobectomy Right upper lobe lung mass with associated significant increased uptake on PET scan. Status post right upper lobe resection June 2014 with pathology showing necrosis in right upper lobe and atypical cells. Malignancy not diagnosed. Lymph nodes free of malignant process.  Plan Continue to follow patient closely as though the patient had a malignancy with serial imaging studies per thoracic surgery Unfortunately microbiologic data was not obtained of the tissue but at this time there is no evidence of active or progressive lung infection and remaining residual lung tissue on the right chest   Dyspnea Dyspnea on the basis of previous right upper lobe lung resection. There is evidence for restrictive disease process on exam today. There is residual pleural effusion. There is residual extrapulmonary air fluid level at the top of the right lower lobe. There was a fistula into the skin from chest tube site that is now sealed. At this point no bronchodilator therapy is offered as the patient did not have and continues to not display significant airway obstruction on exam. Plan Referral to pulmonary rehabilitation will be made Incentive spirometry will be continued in this patient No need for oxygen therapy    Updated Medication List Outpatient Encounter Prescriptions as of 10/10/2012  Medication Sig Dispense Refill  . Calcium Citrate-Vitamin D 315-250 MG-UNIT TABS Take by mouth daily.      . Colloidal Oatmeal (AVEENO ECZEMA THERAPY) 1 % CREA Apply 1 application topically daily as needed (eczema).      . docusate sodium (COLACE) 100 MG capsule Take 100 mg by mouth every other day.      . Eyelid Cleansers (SYSTANE LID WIPES EX) Apply 1 application topically every morning. Apply externally to both eyes.      . fish oil-omega-3 fatty acids 1000 MG capsule Take 2 g by mouth daily.       . Hydrocortisone Butyr Lipo Base  0.1 % CREA Apply 1  application topically as needed. psoriasis      . ibuprofen (ADVIL,MOTRIN) 800 MG tablet Take 800 mg by mouth every 8 hours for 72 hrs, then take 800 mg every 8 hours as needed for pain  30 tablet  0  . LORazepam (ATIVAN) 0.5 MG tablet Take 0.5 mg by mouth at bedtime.       . Multiple Vitamins-Minerals (ICAPS PO) Take 2 tablets by mouth daily.       . ondansetron (ZOFRAN ODT) 4 MG disintegrating tablet Take 1 tablet (4 mg total) by mouth every 8 (eight) hours as needed for nausea.  10 tablet  0  . Polyethyl Glycol-Propyl Glycol (SYSTANE ULTRA) 0.4-0.3 % SOLN Apply 1 drop to eye 2 (two) times daily as needed (dry eyes).      Marland Kitchen senna (SENOKOT) 8.6 MG tablet Take 1 tablet by mouth every other day.      . traMADol (ULTRAM) 50 MG tablet TAKE 1-2 TABLETS BY MOUTH EVERY 6 HOURS AS NEEDED  50 tablet  0  . triamcinolone (KENALOG) topical spray Apply topically 2 (two) times a week. 1 application      . oxyCODONE-acetaminophen (PERCOCET/ROXICET) 5-325 MG per tablet Take 1-2 tablets by mouth every 4 (four) hours as needed.  30 tablet  0  . [DISCONTINUED] cephALEXin (KEFLEX) 500 MG capsule Take 1 capsule (500 mg total) by mouth 3 (three) times daily.  21 capsule  0   No facility-administered encounter medications on file as of 10/10/2012.

## 2012-10-10 NOTE — Patient Instructions (Addendum)
We will refer you to Pulmonary Rehab No change in medications Follow up in 4 months

## 2012-10-11 ENCOUNTER — Encounter: Payer: Self-pay | Admitting: Thoracic Surgery (Cardiothoracic Vascular Surgery)

## 2012-10-11 ENCOUNTER — Encounter: Payer: Self-pay | Admitting: Critical Care Medicine

## 2012-10-11 ENCOUNTER — Ambulatory Visit
Admission: RE | Admit: 2012-10-11 | Discharge: 2012-10-11 | Disposition: A | Discharge status: Home / Self Care | Payer: Medicare PPO | Source: Ambulatory Visit | Attending: Thoracic Surgery (Cardiothoracic Vascular Surgery) | Admitting: Thoracic Surgery (Cardiothoracic Vascular Surgery)

## 2012-10-11 ENCOUNTER — Ambulatory Visit (INDEPENDENT_AMBULATORY_CARE_PROVIDER_SITE_OTHER): Payer: Self-pay | Admitting: Thoracic Surgery (Cardiothoracic Vascular Surgery)

## 2012-10-11 VITALS — BP 121/74 | HR 71 | Resp 20 | Ht 71.0 in | Wt 136.0 lb

## 2012-10-11 DIAGNOSIS — R918 Other nonspecific abnormal finding of lung field: Secondary | ICD-10-CM

## 2012-10-11 DIAGNOSIS — R0600 Dyspnea, unspecified: Secondary | ICD-10-CM | POA: Insufficient documentation

## 2012-10-11 DIAGNOSIS — R222 Localized swelling, mass and lump, trunk: Secondary | ICD-10-CM

## 2012-10-11 DIAGNOSIS — Z902 Acquired absence of lung [part of]: Secondary | ICD-10-CM

## 2012-10-11 DIAGNOSIS — J8410 Pulmonary fibrosis, unspecified: Secondary | ICD-10-CM

## 2012-10-11 DIAGNOSIS — Z9889 Other specified postprocedural states: Secondary | ICD-10-CM

## 2012-10-11 NOTE — Assessment & Plan Note (Signed)
Right upper lobe lung mass with associated significant increased uptake on PET scan. Status post right upper lobe resection June 2014 with pathology showing necrosis in right upper lobe and atypical cells. Malignancy not diagnosed. Lymph nodes free of malignant process.  Plan Continue to follow patient closely as though the patient had a malignancy with serial imaging studies per thoracic surgery Unfortunately microbiologic data was not obtained of the tissue but at this time there is no evidence of active or progressive lung infection and remaining residual lung tissue on the right chest

## 2012-10-11 NOTE — Assessment & Plan Note (Signed)
Dyspnea on the basis of previous right upper lobe lung resection. There is evidence for restrictive disease process on exam today. There is residual pleural effusion. There is residual extrapulmonary air fluid level at the top of the right lower lobe. There was a fistula into the skin from chest tube site that is now sealed. At this point no bronchodilator therapy is offered as the patient did not have and continues to not display significant airway obstruction on exam. Plan Referral to pulmonary rehabilitation will be made Incentive spirometry will be continued in this patient No need for oxygen therapy

## 2012-10-11 NOTE — Progress Notes (Signed)
HPI:  [name] returns today for a scheduled follow up visit.  He had a right upper lobectomy (extrapleural at the apex) and mediastinal node dissection on June 25. Pathology showed intense inflammatory reaction and necrosis. There were some atypical cells but no definite cancer.  His hospital course was uncomplicated. He did develop some drainage from of the chest tube sites about a week after the tube was removed. He had intense coughing spell and expulsion of a fairly large amount of serous fluid. An ostomy bag was placed over the wound about 10 days ago and he really hasn't had any additional drainage since then.  He is still having some incisional pain. He is taking Ultram about 3 times a day. He says that as he's gotten more active the pain has worsened. He is frustrated that his breathing is not at his preoperative capacity.  He saw Dr. Delford Prince yesterday.  Past Medical History  Diagnosis Date  . Lung mass   . BPH (benign prostatic hyperplasia)   . Psoriasis   . Osteoporosis   . Anxiety   . Hiatal hernia   . Positional vertigo   . Prolapse of mitral valve     click  . Sebaceous cyst     left shoulder  . Allergic rhinitis   . CAD (coronary artery disease) 08/12/12    LAD calcification noted on CT  . GERD (gastroesophageal reflux disease)     currently not using antacid, hasn't for approx. one yr.   . History of stress test 08/2012    told that it was wnl  . Heart murmur     told that he has murmur by Dr. Pete Prince   . Sinusitis     frequently  . Inguinal hernia     left side   . Lung cancer   . Exertional shortness of breath     "last 8-16yrs" (09/14/2012)  . Arthritis     pt. reports that he has aches & pain, unsure if its related to degenerative process           Current Outpatient Prescriptions  Medication Sig Dispense Refill  . Calcium Citrate-Vitamin D 315-250 MG-UNIT TABS Take by mouth daily.      . Colloidal Oatmeal (AVEENO ECZEMA THERAPY) 1 % CREA  Apply 1 application topically daily as needed (eczema).      . docusate sodium (COLACE) 100 MG capsule Take 100 mg by mouth every other day.      . Eyelid Cleansers (SYSTANE LID WIPES EX) Apply 1 application topically every morning. Apply externally to both eyes.      . fish oil-omega-3 fatty acids 1000 MG capsule Take 2 g by mouth daily.       . Hydrocortisone Butyr Lipo Base 0.1 % CREA Apply 1 application topically as needed. psoriasis      . ibuprofen (ADVIL,MOTRIN) 800 MG tablet Take 800 mg by mouth every 8 hours for 72 hrs, then take 800 mg every 8 hours as needed for pain  30 tablet  0  . LORazepam (ATIVAN) 0.5 MG tablet Take 0.5 mg by mouth at bedtime.       . Multiple Vitamins-Minerals (ICAPS PO) Take 2 tablets by mouth daily.       . ondansetron (ZOFRAN ODT) 4 MG disintegrating tablet Take 1 tablet (4 mg total) by mouth every 8 (eight) hours as needed for nausea.  10 tablet  0  . Polyethyl Glycol-Propyl Glycol (SYSTANE ULTRA) 0.4-0.3 % SOLN Apply 1  drop to eye 2 (two) times daily as needed (dry eyes).      Marland Kitchen senna (SENOKOT) 8.6 MG tablet Take 1 tablet by mouth every other day.      . traMADol (ULTRAM) 50 MG tablet TAKE 1-2 TABLETS BY MOUTH EVERY 6 HOURS AS NEEDED  50 tablet  0  . triamcinolone (KENALOG) topical spray Apply topically 2 (two) times a week. 1 application      . oxyCODONE-acetaminophen (PERCOCET/ROXICET) 5-325 MG per tablet Take 1-2 tablets by mouth every 4 (four) hours as needed.  30 tablet  0   No current facility-administered medications for this visit.    Physical Exam BP 121/74  Pulse 71  Resp 20  Ht 5\' 11"  (1.803 m)  Wt 136 lb (61.689 kg)  BMI 18.98 kg/m2  SpO82 51% 79 year old male in no acute distress Lungs with essentially equal breath sounds bilaterally Incision well-healed Chest tube site with some mild erythema and eschar, no drainage  Diagnostic Tests: Chest x-ray 10/11/2012 *RADIOLOGY REPORT*  Clinical Data: Follow-up evaluation of lung mass.   CHEST - 2 VIEW  Comparison: Chest x-ray 09/30/2012.  Findings: Again noted is a small loculated hydropneumothorax in the  superior aspect of the right hemithorax. Small right pleural  effusion dependently. Linear opacities in the right base favored  to represent areas of scarring. No definite acute consolidative  air space disease. No evidence of pulmonary edema. Heart size is  normal. Upper mediastinal contours are within normal limits.  Postoperative changes of right upper lobectomy are again noted.  IMPRESSION:  1. Slight decrease in size of the loculated hydropneumothorax in  the apex of the right hemithorax.  2. Status post right upper lobectomy with mild scarring at the  right base and a small chronic right-sided pleural effusion  layering dependently which is unchanged.  Original Report Authenticated By: Trudie Reed, M.D.  Impression: 79 year old gentleman now about a month out from a right thoracotomy and upper lobectomy. Overall he is doing well at this point. I think his expectations were little unrealistic as to how quickly he would recover from this operation. That may be partially my fault. It is not at all surprisingly still using Ultram for pain at this point in time, in fact most patients with likely be using higher doses than he is.   I explained to him that the large degree his current respiratory function is diminished due to chest wall mechanics as much or moreso than the loss of lung tissue. That should improve with time and although his pulmonary function may not quite reach his preoperative baseline the difference should be negligible. This will not happen overnight and likely will take around 6-9 months.  I advised him to try using acetaminophen (500 mg 4 times daily) to help him wean himself off the Ultram. Although I am not in any hurry to get him off the tramadol at this point, the acetaminophen may help.  Plan: I will plan to see him back in 2 months with a  chest x-ray  He knows to call if he has any problems in the meantime.

## 2012-11-04 ENCOUNTER — Other Ambulatory Visit: Payer: Self-pay | Admitting: Thoracic Surgery (Cardiothoracic Vascular Surgery)

## 2012-11-04 DIAGNOSIS — G8918 Other acute postprocedural pain: Secondary | ICD-10-CM

## 2012-11-22 ENCOUNTER — Other Ambulatory Visit: Payer: Self-pay

## 2012-11-22 ENCOUNTER — Other Ambulatory Visit: Payer: Self-pay | Admitting: Thoracic Surgery (Cardiothoracic Vascular Surgery)

## 2012-11-22 ENCOUNTER — Telehealth: Payer: Self-pay | Admitting: Critical Care Medicine

## 2012-11-22 DIAGNOSIS — G8918 Other acute postprocedural pain: Secondary | ICD-10-CM

## 2012-11-22 MED ORDER — TRAMADOL HCL 50 MG PO TABS: 50.0000 mg | ORAL_TABLET | Freq: Four times a day (QID) | ORAL | Status: DC | PRN

## 2012-11-22 NOTE — Telephone Encounter (Signed)
I spoke with Tony Prince from pulm rehab. She stated pt should be getting a call in about 1-2 weeks to scheduled to start.   I called and made pt aware. Nothing further needed

## 2012-12-06 ENCOUNTER — Encounter (HOSPITAL_COMMUNITY): Payer: Self-pay

## 2012-12-06 ENCOUNTER — Encounter (HOSPITAL_COMMUNITY)
Admission: RE | Admit: 2012-12-06 | Discharge: 2012-12-06 | Disposition: A | Discharge status: Home / Self Care | Payer: Medicare PPO | Source: Ambulatory Visit | Attending: Critical Care Medicine | Admitting: Critical Care Medicine

## 2012-12-06 DIAGNOSIS — Z5189 Encounter for other specified aftercare: Secondary | ICD-10-CM | POA: Insufficient documentation

## 2012-12-06 DIAGNOSIS — C3410 Malignant neoplasm of upper lobe, unspecified bronchus or lung: Secondary | ICD-10-CM | POA: Insufficient documentation

## 2012-12-06 NOTE — Progress Notes (Addendum)
Patient in today for orientation to pulmonary rehab 30 minutes .  He is scheduled to begin exercising on Tuesday, December 13, 2012 in the 1:30 pm.   Patient is status post VATS surgery and his goal is to exercise at the [facility] independenttly as he was doing before his surgery at his same physical level or above.   He was oriented to Pulmonary Rehab and was breathing without distress on room air. Oxygen saturations were 100% after walking a long distance from the [facility].  His skin was warm and dry,  oriented x 3, equal grip strength, excellent balance, no weakness in arms or lege. Pulses regular, 3+ bilateral posterior tibial pulses, 1+ pitting edema on both legs.  Breath sound coarse, with diminished in right base.   Oriented to purse lip breathing, education classes, exercise equipment, and parking.   His psycho-social PHQ score is 9.  He lives alone, and has a very supportive daughter who lives in [place].  He picks his granddaughter up from school every afternoon.  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  His mood is good and voices no stress in his life.  He does experience trouble sleeping at night, and has a poor appetite.  Breakfast is his best meal of the day.  As he tires during the day his appetite wanes.    10:40-11:30 Haskel Khan RN

## 2012-12-12 ENCOUNTER — Other Ambulatory Visit: Payer: Self-pay | Admitting: *Deleted

## 2012-12-12 DIAGNOSIS — R918 Other nonspecific abnormal finding of lung field: Secondary | ICD-10-CM

## 2012-12-13 ENCOUNTER — Encounter (HOSPITAL_COMMUNITY)
Admission: RE | Admit: 2012-12-13 | Discharge: 2012-12-13 | Disposition: A | Discharge status: Home / Self Care | Payer: Medicare PPO | Source: Ambulatory Visit | Attending: Critical Care Medicine | Admitting: Critical Care Medicine

## 2012-12-13 ENCOUNTER — Encounter: Payer: Self-pay | Admitting: Thoracic Surgery (Cardiothoracic Vascular Surgery)

## 2012-12-13 ENCOUNTER — Ambulatory Visit (INDEPENDENT_AMBULATORY_CARE_PROVIDER_SITE_OTHER): Payer: Medicare PPO | Admitting: Thoracic Surgery (Cardiothoracic Vascular Surgery)

## 2012-12-13 ENCOUNTER — Ambulatory Visit
Admission: RE | Admit: 2012-12-13 | Discharge: 2012-12-13 | Disposition: A | Discharge status: Home / Self Care | Payer: Medicare PPO | Source: Ambulatory Visit | Attending: Thoracic Surgery (Cardiothoracic Vascular Surgery) | Admitting: Thoracic Surgery (Cardiothoracic Vascular Surgery)

## 2012-12-13 VITALS — BP 140/71 | HR 60 | Resp 20 | Ht 71.0 in | Wt 131.0 lb

## 2012-12-13 DIAGNOSIS — R918 Other nonspecific abnormal finding of lung field: Secondary | ICD-10-CM

## 2012-12-13 DIAGNOSIS — R222 Localized swelling, mass and lump, trunk: Secondary | ICD-10-CM

## 2012-12-13 DIAGNOSIS — Z9889 Other specified postprocedural states: Secondary | ICD-10-CM

## 2012-12-13 DIAGNOSIS — J852 Abscess of lung without pneumonia: Secondary | ICD-10-CM

## 2012-12-13 DIAGNOSIS — Z902 Acquired absence of lung [part of]: Secondary | ICD-10-CM

## 2012-12-13 NOTE — Progress Notes (Signed)
HPI:  Tony Prince returns today for a scheduled postoperative followup visit. He had an extra pleural right upper lobectomy 3 months ago for suspected cancer. On pathology it turned out that he did not have cancer. There were atypical cells but primarily there was great deal of inflammation and necrosis.  He was seen back in the office in July at which time he was still having a lot of incisional pain. He was more frustrated by the fact that he did not feel like he had is normal exercise and respiratory capacity. I did explain at that time that was to be expected and would improve over time.  Since his last visit he does feel better. He is still taking one tramadol tablet nightly. He says that he gets more sore as the day goes on and correlates has pain with fatigue. His exercise capacity is still not back to baseline, but he is starting rehabilitation today.  He is just back from [place] were unfortunately he attended his brother's funeral. He says that that cause him a great deal of stress and he has felt "worn out" since then.  Past Medical History  Diagnosis Date  . Lung mass   . BPH (benign prostatic hyperplasia)   . Psoriasis   . Osteoporosis   . Anxiety   . Hiatal hernia   . Positional vertigo   . Prolapse of mitral valve     click  . Sebaceous cyst     left shoulder  . Allergic rhinitis   . CAD (coronary artery disease) 08/12/12    LAD calcification noted on CT  . GERD (gastroesophageal reflux disease)     currently not using antacid, hasn't for approx. one yr.   . History of stress test 08/2012    told that it was wnl  . Heart murmur     told that he has murmur by Dr. Pete Glatter   . Sinusitis     frequently  . Inguinal hernia     left side   . Exertional shortness of breath     "last 8-34yrs" (09/14/2012)  . Arthritis     pt. reports that he has aches & pain, unsure if its related to degenerative process     . Pulmonary necrosis     RUL with resection ? underlying CA,  neg pathology for CA      Current Outpatient Prescriptions  Medication Sig Dispense Refill  . Calcium Citrate-Vitamin D 315-250 MG-UNIT TABS Take by mouth daily.      . Colloidal Oatmeal (AVEENO ECZEMA THERAPY) 1 % CREA Apply 1 application topically daily as needed (eczema).      Marland Kitchen dexlansoprazole (DEXILANT) 60 MG capsule Take 60 mg by mouth daily.      . Eyelid Cleansers (SYSTANE LID WIPES EX) Apply 1 application topically every morning. Apply externally to both eyes.      . fish oil-omega-3 fatty acids 1000 MG capsule Take 2 g by mouth daily.       . Hydrocortisone Butyr Lipo Base 0.1 % CREA Apply 1 application topically as needed. psoriasis      . ibuprofen (ADVIL,MOTRIN) 800 MG tablet Take 800 mg by mouth every 8 hours for 72 hrs, then take 800 mg every 8 hours as needed for pain  30 tablet  0  . LORazepam (ATIVAN) 0.5 MG tablet Take 0.5 mg by mouth at bedtime.       . Multiple Vitamins-Minerals (ICAPS PO) Take 2 tablets by mouth daily.       Marland Kitchen  ondansetron (ZOFRAN ODT) 4 MG disintegrating tablet Take 1 tablet (4 mg total) by mouth every 8 (eight) hours as needed for nausea.  10 tablet  0  . Polyethyl Glycol-Propyl Glycol (SYSTANE ULTRA) 0.4-0.3 % SOLN Apply 1 drop to eye 2 (two) times daily as needed (dry eyes).      . polyethylene glycol (MIRALAX / GLYCOLAX) packet Take 17 g by mouth daily.      . traMADol (ULTRAM) 50 MG tablet Take 1 tablet (50 mg total) by mouth every 6 (six) hours as needed for pain.  40 tablet  0  . triamcinolone (KENALOG) topical spray Apply topically 2 (two) times a week. 1 application       No current facility-administered medications for this visit.    Physical Exam BP 140/71  Pulse 60  Resp 20  Ht 5\' 11"  (1.803 m)  Wt 131 lb (59.421 kg)  BMI 18.28 kg/m2  SpO2 60% Thin 79 year old male in no acute distress Incisions well healed Lungs clear with equal breath sounds bilaterally Cardiac regular rate and rhythm  Diagnostic Tests: Chest x-ray EXAM:   CHEST 2 VIEW  COMPARISON: Chest x-ray of 10/11/2012  FINDINGS:  There is a decrease in volume of the loculated right apical hydro  pneumothorax. Scarring at the right lung base and a tiny right  effusion remain. The left lung is clear. Heart size is stable. No  bony abnormality is seen.  IMPRESSION:  1. Decrease in loculated anterior right hydro pneumothorax.  2. Postop scarring at the right lung base with tiny right pleural  effusion.  Electronically Signed  By: Dwyane Dee M.D.  On: 12/13/2012 12:05  Impression: Tony Prince is a 79 year old gentleman who is now about 3 months post right upper lobectomy for what turned out to be a severe inflammatory mass. He seems to be doing well at this point in time. He does still have some pain which is not uncommon with a thoracotomy at 3 months. I encouraged him to continue to do his rehabilitation.   Plan: I will plan to see Tony Prince back in 3 months to check on his progress. We'll do a chest x-ray at that time.

## 2012-12-15 ENCOUNTER — Encounter (HOSPITAL_COMMUNITY): Payer: Medicare PPO

## 2012-12-20 ENCOUNTER — Encounter (HOSPITAL_COMMUNITY)
Admission: RE | Admit: 2012-12-20 | Discharge: 2012-12-20 | Disposition: A | Discharge status: Home / Self Care | Payer: Medicare PPO | Source: Ambulatory Visit | Attending: Critical Care Medicine | Admitting: Critical Care Medicine

## 2012-12-21 ENCOUNTER — Other Ambulatory Visit: Payer: Self-pay | Admitting: Thoracic Surgery (Cardiothoracic Vascular Surgery)

## 2012-12-22 ENCOUNTER — Encounter (HOSPITAL_COMMUNITY)
Admission: RE | Admit: 2012-12-22 | Discharge: 2012-12-22 | Disposition: A | Discharge status: Home / Self Care | Payer: Medicare PPO | Source: Ambulatory Visit | Attending: Critical Care Medicine | Admitting: Critical Care Medicine

## 2012-12-22 DIAGNOSIS — Z5189 Encounter for other specified aftercare: Secondary | ICD-10-CM | POA: Insufficient documentation

## 2012-12-22 DIAGNOSIS — C3410 Malignant neoplasm of upper lobe, unspecified bronchus or lung: Secondary | ICD-10-CM | POA: Insufficient documentation

## 2012-12-23 ENCOUNTER — Other Ambulatory Visit: Payer: Self-pay | Admitting: Thoracic Surgery (Cardiothoracic Vascular Surgery)

## 2012-12-23 DIAGNOSIS — G8918 Other acute postprocedural pain: Secondary | ICD-10-CM

## 2012-12-23 NOTE — Telephone Encounter (Signed)
Refill for Tramadol 50 mg #40, no refills called to [facility]

## 2012-12-27 ENCOUNTER — Encounter (HOSPITAL_COMMUNITY)
Admission: RE | Admit: 2012-12-27 | Discharge: 2012-12-27 | Disposition: A | Discharge status: Home / Self Care | Payer: Medicare PPO | Source: Ambulatory Visit | Attending: Critical Care Medicine | Admitting: Critical Care Medicine

## 2012-12-29 ENCOUNTER — Encounter (HOSPITAL_COMMUNITY)
Admission: RE | Admit: 2012-12-29 | Discharge: 2012-12-29 | Disposition: A | Discharge status: Home / Self Care | Payer: Medicare PPO | Source: Ambulatory Visit | Attending: Critical Care Medicine | Admitting: Critical Care Medicine

## 2013-01-03 ENCOUNTER — Encounter (HOSPITAL_COMMUNITY)
Admission: RE | Admit: 2013-01-03 | Discharge: 2013-01-03 | Disposition: A | Discharge status: Home / Self Care | Payer: Medicare PPO | Source: Ambulatory Visit | Attending: Critical Care Medicine | Admitting: Critical Care Medicine

## 2013-01-05 ENCOUNTER — Encounter (HOSPITAL_COMMUNITY)
Admission: RE | Admit: 2013-01-05 | Discharge: 2013-01-05 | Disposition: A | Discharge status: Home / Self Care | Payer: Medicare PPO | Source: Ambulatory Visit | Attending: Critical Care Medicine | Admitting: Critical Care Medicine

## 2013-01-10 ENCOUNTER — Encounter (HOSPITAL_COMMUNITY): Payer: Medicare PPO

## 2013-01-12 ENCOUNTER — Encounter (HOSPITAL_COMMUNITY)
Admission: RE | Admit: 2013-01-12 | Discharge: 2013-01-12 | Disposition: A | Discharge status: Home / Self Care | Payer: Medicare PPO | Source: Ambulatory Visit | Attending: Critical Care Medicine | Admitting: Critical Care Medicine

## 2013-01-17 ENCOUNTER — Encounter (HOSPITAL_COMMUNITY)
Admission: RE | Admit: 2013-01-17 | Discharge: 2013-01-17 | Disposition: A | Discharge status: Home / Self Care | Payer: Medicare PPO | Source: Ambulatory Visit | Attending: Critical Care Medicine | Admitting: Critical Care Medicine

## 2013-01-17 NOTE — Progress Notes (Signed)
Tony Prince 79 y.o. male Nutrition Note Spoke with pt. Pt is underweight. Per pt, pt wt is down 14 lbs over the past 3 months. Pt's wt in EMR reviewed and confirms wt loss. Per discussion, pt states "I like this weight. This is what I weighed when I was at my healthiest." Pt agreeable to weight gain "as long as it's muscle and not in my belly."  Pt encouraged to consume high calorie, high protein foods to help promote wt gain. Pt drinking 1 Boost "every other day, which I think is a lot." Pt encouraged to consume supplement between meals more frequently. Pt making healthy food choices the majority of the time.  Pt's Rate Your Plate results reviewed with pt.  Pt expressed understanding.  Pt avoids many salty foods; uses evol frozen food or "high quality frozen dinners fairly frequently."  Pt adds salt to food ocassionally.  The role of sodium in lung disease reviewed with pt.    Nutrition Diagnosis   Food-and nutrition-related knowledge deficit related to lack of exposure to information as related to diagnosis of pulmonary disease   Increased energy expenditure related to increased energy requirements post-op as evidenced by BMI <20 and recent h/o wt loss.  Nutrition Rx/Est. Daily Nutrition Needs for: ? wt gain 2500-3000 Kcal  90-105 gm protein   1500-2000 mg or less sodium      Nutrition Intervention   Pt's individual nutrition plan and goals reviewed with pt.   Benefits of adopting healthy eating habits discussed when pt's Rate Your Plate reviewed.   Pt to increase Boost/Ensure supplement to at least 1 per day.   Pt to increase frequency of high calorie foods consumed.   Pt to attend the Nutrition and Lung Disease class   Continual client-centered nutrition education by RD, as part of interdisciplinary care. Goal(s) 1. The pt will recognize symptoms that can interfere with adequate oral intake, such as shortness of breath, N/V, early satiety, fatigue, ability to secure and prepare food, taste  and smell changes, chewing/swallowing difficulties, and/ or pain when eating. 2. The pt will consume high-energy, high-nutrient dense beverages when necessary to compensate for decreased oral intake of solid foods. 3. Identify food quantities necessary to achieve wt gain of  -2# per week to a goal wt of 61.8-65.5 kg (136-144 lb) at graduation from pulmonary rehab. Monitor and Evaluate progress toward nutrition goal with team.   Mickle Plumb, M.Ed, RD, LDN, CDE 01/17/2013 3:18 PM

## 2013-01-19 ENCOUNTER — Encounter (HOSPITAL_COMMUNITY)
Admission: RE | Admit: 2013-01-19 | Discharge: 2013-01-19 | Disposition: A | Discharge status: Home / Self Care | Payer: Medicare PPO | Source: Ambulatory Visit | Attending: Critical Care Medicine | Admitting: Critical Care Medicine

## 2013-01-24 ENCOUNTER — Encounter (HOSPITAL_COMMUNITY)
Admission: RE | Admit: 2013-01-24 | Discharge: 2013-01-24 | Disposition: A | Discharge status: Home / Self Care | Payer: Medicare PPO | Source: Ambulatory Visit | Attending: Critical Care Medicine | Admitting: Critical Care Medicine

## 2013-01-24 DIAGNOSIS — C3410 Malignant neoplasm of upper lobe, unspecified bronchus or lung: Secondary | ICD-10-CM | POA: Insufficient documentation

## 2013-01-24 DIAGNOSIS — Z5189 Encounter for other specified aftercare: Secondary | ICD-10-CM | POA: Insufficient documentation

## 2013-01-26 ENCOUNTER — Encounter (HOSPITAL_COMMUNITY)
Admission: RE | Admit: 2013-01-26 | Discharge: 2013-01-26 | Disposition: A | Discharge status: Home / Self Care | Payer: Medicare PPO | Source: Ambulatory Visit | Attending: Critical Care Medicine | Admitting: Critical Care Medicine

## 2013-01-31 ENCOUNTER — Encounter (HOSPITAL_COMMUNITY)
Admission: RE | Admit: 2013-01-31 | Discharge: 2013-01-31 | Disposition: A | Discharge status: Home / Self Care | Payer: Medicare PPO | Source: Ambulatory Visit | Attending: Critical Care Medicine | Admitting: Critical Care Medicine

## 2013-02-02 ENCOUNTER — Encounter (HOSPITAL_COMMUNITY)
Admission: RE | Admit: 2013-02-02 | Discharge: 2013-02-02 | Disposition: A | Discharge status: Home / Self Care | Payer: Medicare PPO | Source: Ambulatory Visit | Attending: Critical Care Medicine | Admitting: Critical Care Medicine

## 2013-02-07 ENCOUNTER — Encounter (HOSPITAL_COMMUNITY)
Admission: RE | Admit: 2013-02-07 | Discharge: 2013-02-07 | Disposition: A | Discharge status: Home / Self Care | Payer: Medicare PPO | Source: Ambulatory Visit | Attending: Critical Care Medicine | Admitting: Critical Care Medicine

## 2013-02-08 ENCOUNTER — Other Ambulatory Visit: Payer: Self-pay | Admitting: Thoracic Surgery (Cardiothoracic Vascular Surgery)

## 2013-02-09 ENCOUNTER — Encounter (HOSPITAL_COMMUNITY): Payer: Self-pay

## 2013-02-09 ENCOUNTER — Encounter (HOSPITAL_COMMUNITY): Payer: Medicare PPO

## 2013-02-09 NOTE — Progress Notes (Signed)
I have reviewed a Home Exercise Program with the patient.  They will be walking at home for their exercise.  The patient was advised to walk 3 times a week for 20 minutes.  I have educated the patient on how to progress this exercise.  The patient states that his goal is to get back to the [facility] and jog on the treadmill.  He also would like to get into the weights and do some resistance training.  We reviewed exercise guidelines, target heart rate during exercise, oxygen use, weather, home pulse oximeter, endpoints for exercise, and goals.  Patient has stated that they understand and are encouraged to come to me with any questions.

## 2013-02-14 ENCOUNTER — Encounter (HOSPITAL_COMMUNITY): Payer: Medicare PPO

## 2013-02-16 ENCOUNTER — Encounter (HOSPITAL_COMMUNITY): Payer: Medicare PPO

## 2013-02-21 ENCOUNTER — Encounter (HOSPITAL_COMMUNITY)
Admission: RE | Admit: 2013-02-21 | Discharge: 2013-02-21 | Disposition: A | Discharge status: Home / Self Care | Payer: Medicare PPO | Source: Ambulatory Visit | Attending: Critical Care Medicine | Admitting: Critical Care Medicine

## 2013-02-21 DIAGNOSIS — Z5189 Encounter for other specified aftercare: Secondary | ICD-10-CM | POA: Insufficient documentation

## 2013-02-21 DIAGNOSIS — C3410 Malignant neoplasm of upper lobe, unspecified bronchus or lung: Secondary | ICD-10-CM | POA: Insufficient documentation

## 2013-02-23 ENCOUNTER — Encounter (HOSPITAL_COMMUNITY)
Admission: RE | Admit: 2013-02-23 | Discharge: 2013-02-23 | Disposition: A | Discharge status: Home / Self Care | Payer: Medicare PPO | Source: Ambulatory Visit | Attending: Critical Care Medicine | Admitting: Critical Care Medicine

## 2013-02-28 ENCOUNTER — Encounter (HOSPITAL_COMMUNITY)
Admission: RE | Admit: 2013-02-28 | Discharge: 2013-02-28 | Disposition: A | Discharge status: Home / Self Care | Payer: Medicare PPO | Source: Ambulatory Visit | Attending: Critical Care Medicine | Admitting: Critical Care Medicine

## 2013-03-02 ENCOUNTER — Encounter (HOSPITAL_COMMUNITY)
Admission: RE | Admit: 2013-03-02 | Discharge: 2013-03-02 | Disposition: A | Discharge status: Home / Self Care | Payer: Medicare PPO | Source: Ambulatory Visit | Attending: Critical Care Medicine | Admitting: Critical Care Medicine

## 2013-03-07 ENCOUNTER — Encounter (HOSPITAL_COMMUNITY)
Admission: RE | Admit: 2013-03-07 | Discharge: 2013-03-07 | Disposition: A | Discharge status: Home / Self Care | Payer: Medicare PPO | Source: Ambulatory Visit | Attending: Critical Care Medicine | Admitting: Critical Care Medicine

## 2013-03-09 ENCOUNTER — Encounter (HOSPITAL_COMMUNITY)
Admission: RE | Admit: 2013-03-09 | Discharge: 2013-03-09 | Disposition: A | Discharge status: Home / Self Care | Payer: Medicare PPO | Source: Ambulatory Visit | Attending: Critical Care Medicine | Admitting: Critical Care Medicine

## 2013-03-10 ENCOUNTER — Other Ambulatory Visit: Payer: Self-pay | Admitting: *Deleted

## 2013-03-10 DIAGNOSIS — R918 Other nonspecific abnormal finding of lung field: Secondary | ICD-10-CM

## 2013-03-14 ENCOUNTER — Encounter: Payer: Self-pay | Admitting: Thoracic Surgery (Cardiothoracic Vascular Surgery)

## 2013-03-14 ENCOUNTER — Encounter (HOSPITAL_COMMUNITY): Admission: RE | Admit: 2013-03-14 | Payer: Medicare PPO | Source: Ambulatory Visit

## 2013-03-14 ENCOUNTER — Ambulatory Visit (INDEPENDENT_AMBULATORY_CARE_PROVIDER_SITE_OTHER): Payer: Medicare PPO | Admitting: Thoracic Surgery (Cardiothoracic Vascular Surgery)

## 2013-03-14 ENCOUNTER — Ambulatory Visit
Admission: RE | Admit: 2013-03-14 | Discharge: 2013-03-14 | Disposition: A | Discharge status: Home / Self Care | Payer: Medicare PPO | Source: Ambulatory Visit | Attending: Thoracic Surgery (Cardiothoracic Vascular Surgery) | Admitting: Thoracic Surgery (Cardiothoracic Vascular Surgery)

## 2013-03-14 VITALS — BP 127/72 | HR 56 | Resp 20 | Ht 71.0 in | Wt 131.0 lb

## 2013-03-14 DIAGNOSIS — Z9889 Other specified postprocedural states: Secondary | ICD-10-CM

## 2013-03-14 DIAGNOSIS — R918 Other nonspecific abnormal finding of lung field: Secondary | ICD-10-CM

## 2013-03-14 DIAGNOSIS — J852 Abscess of lung without pneumonia: Secondary | ICD-10-CM

## 2013-03-14 DIAGNOSIS — Z902 Acquired absence of lung [part of]: Secondary | ICD-10-CM

## 2013-03-14 DIAGNOSIS — R222 Localized swelling, mass and lump, trunk: Secondary | ICD-10-CM

## 2013-03-14 NOTE — Progress Notes (Signed)
HPI:  Mr. Gelpi turns today for 6 month followup.  He is a 79 year old gentleman presented with a right upper lobe mass. We did a right upper lobectomy which required an extra pleural resection and a thoracotomy back in June. The mass turned out to be inflammatory and not malignant. He had a lot of difficulty with pain initially. He also had difficulty with shortness of breath and his primary issue was difficulty sleeping. I saw him in the office in September at which time he was making slow progress.  In the interim since his last visit he has started pulmonary rehabilitation. He says that he gets a lot of muscular soreness with that which he is able to differentiate from the incisional pain. He does still have some incisional pain has not had to take anything for that in about a month.  He continues to have difficulty sleeping. He was taking Ultram up until a couple of weeks ago. He hasn't slept through the night and months. He finally stop taking the Ultram about 2-1/2 weeks ago. He was sleeping about 2 hours at night. He had tried some medications with Dr. Pete Glatter, but he did not think they were affected. He says he does not want to go on a sleeping pill.  Past Medical History  Diagnosis Date  . Lung mass   . BPH (benign prostatic hyperplasia)   . Psoriasis   . Osteoporosis   . Anxiety   . Hiatal hernia   . Positional vertigo   . Prolapse of mitral valve     click  . Sebaceous cyst     left shoulder  . Allergic rhinitis   . CAD (coronary artery disease) 08/12/12    LAD calcification noted on CT  . GERD (gastroesophageal reflux disease)     currently not using antacid, hasn't for approx. one yr.   . History of stress test 08/2012    told that it was wnl  . Heart murmur     told that he has murmur by Dr. Pete Glatter   . Sinusitis     frequently  . Inguinal hernia     left side   . Exertional shortness of breath     "last 8-43yrs" (09/14/2012)  . Arthritis     pt. reports that  he has aches & pain, unsure if its related to degenerative process     . Pulmonary necrosis     RUL with resection ? underlying CA, neg pathology for CA     Current Outpatient Prescriptions  Medication Sig Dispense Refill  . Calcium Citrate-Vitamin D 315-250 MG-UNIT TABS Take by mouth daily.      . Colloidal Oatmeal (AVEENO ECZEMA THERAPY) 1 % CREA Apply 1 application topically daily as needed (eczema).      Marland Kitchen dexlansoprazole (DEXILANT) 60 MG capsule Take 60 mg by mouth daily.      . Eyelid Cleansers (SYSTANE LID WIPES EX) Apply 1 application topically every morning. Apply externally to both eyes.      . fish oil-omega-3 fatty acids 1000 MG capsule Take 2 g by mouth daily.       . Hydrocortisone Butyr Lipo Base 0.1 % CREA Apply 1 application topically as needed. psoriasis      . ibuprofen (ADVIL,MOTRIN) 800 MG tablet Take 800 mg by mouth every 8 hours for 72 hrs, then take 800 mg every 8 hours as needed for pain  30 tablet  0  . LORazepam (ATIVAN) 0.5 MG tablet Take 0.5  mg by mouth at bedtime.       . Multiple Vitamins-Minerals (ICAPS PO) Take 2 tablets by mouth daily.       . ondansetron (ZOFRAN ODT) 4 MG disintegrating tablet Take 1 tablet (4 mg total) by mouth every 8 (eight) hours as needed for nausea.  10 tablet  0  . Polyethyl Glycol-Propyl Glycol (SYSTANE ULTRA) 0.4-0.3 % SOLN Apply 1 drop to eye 2 (two) times daily as needed (dry eyes).      . polyethylene glycol (MIRALAX / GLYCOLAX) packet Take 17 g by mouth daily.      . traMADol (ULTRAM) 50 MG tablet TAKE 1-2 TABLETS EVERY 6 TO 8 HOURS AS NEEDED FOR PAIN  40 tablet  0  . triamcinolone (KENALOG) topical spray Apply topically 2 (two) times a week. 1 application       No current facility-administered medications for this visit.    Physical Exam BP 127/72  Pulse 56  Resp 20  Ht 5\' 11"  (1.803 m)  Wt 131 lb (59.421 kg)  BMI 18.28 kg/m2  SpO2 22% Thin 79 year old male in no acute distress No cervical or suprapubic or  adenopathy Lungs diminished breath sounds bilaterally Cardiac regular rate and rhythm normal S1 and S2  Diagnostic Tests: Chest x-ray 03/14/2013 CHEST 2 VIEW  COMPARISON: Two-view chest 12/13/2012.  FINDINGS:  No residual pleural air is evident. There is now scarring at the  right lung apex. The patient is status post right upper lobe  thoracotomy. Right upper lobe volume loss is again noted. There is  retraction of the right base.  Emphysematous changes again seen. No focal airspace disease is  evident. Mild rightward curvature of the mid thoracic spine is  stable.  IMPRESSION:  1. Interval resolution of the and right apical pneumothorax. Pleural  thickening is now evident.  2. Right up low volume loss following surgery.  3. Emphysema.  Electronically Signed  By: Gennette Pac M.D.  On: 03/14/2013 09:56   Impression: Mr. Dooling is a 79 year old gentleman who is now 6 months post extrapleural right upper lobectomy for what turned out to be an inflammatory mass. He seems to be making that her progress at this point. He still having difficulty with sleeping. He's been working with Dr. Pete Glatter on that. I am not quite sure what Mr. Ells reluctance to using a sleeping medication is, but says he is afraid we will get addicted to it if he uses one.  His chest x-ray looks great. There are postoperative changes but no effusions, or infiltrates, or masses.  His pain has improved although he does still have some incisional pain, he is not requiring narcotics.  He had numerous questions about his overall health and whether he had lung disease or not. I reviewed his pulmonary function testing from June with them which showed equivalent reductions inflows and volumes with a normal FEV1 to FVC ratio and diminished diffusion capacity with a DLCO of 55%. He sees Dr. Delford Field in January and will discuss some of these issues with him as well.   Plan: Return in 6 months with CT of chest for one  year followup.

## 2013-03-16 ENCOUNTER — Encounter (HOSPITAL_COMMUNITY): Payer: Medicare PPO

## 2013-03-21 ENCOUNTER — Encounter (HOSPITAL_COMMUNITY): Payer: Medicare PPO

## 2013-03-21 ENCOUNTER — Ambulatory Visit (INDEPENDENT_AMBULATORY_CARE_PROVIDER_SITE_OTHER): Payer: Medicare PPO | Admitting: Critical Care Medicine

## 2013-03-21 ENCOUNTER — Encounter: Payer: Self-pay | Admitting: Critical Care Medicine

## 2013-03-21 VITALS — BP 112/60 | HR 60 | Temp 98.1°F | Ht 71.0 in | Wt 134.0 lb

## 2013-03-21 DIAGNOSIS — Z9889 Other specified postprocedural states: Secondary | ICD-10-CM

## 2013-03-21 DIAGNOSIS — R0609 Other forms of dyspnea: Secondary | ICD-10-CM

## 2013-03-21 DIAGNOSIS — Z902 Acquired absence of lung [part of]: Secondary | ICD-10-CM

## 2013-03-21 DIAGNOSIS — R0600 Dyspnea, unspecified: Secondary | ICD-10-CM

## 2013-03-21 MED ORDER — AMOXICILLIN-POT CLAVULANATE 875-125 MG PO TABS: 1.0000 | ORAL_TABLET | Freq: Two times a day (BID) | ORAL | Status: DC

## 2013-03-21 NOTE — Assessment & Plan Note (Signed)
Previous right upper lobectomy for necrotizing process right upper lobe without malignancy discerned The patient is completely recovered from the procedure

## 2013-03-21 NOTE — Assessment & Plan Note (Signed)
Ongoing dyspnea with exertion with previously documented low diffusion capacity prior to right upper lobe resection Superimposed acute sinusitis Need to rule out perfusion defect her right lung Need to rule out nocturnal hypoxemia with current complaint of insomnia Need to reevaluate lung function status post lobectomy Plan Take prednisone as prescribed Take Augmentin twice daily for 10days Will obtain ventilation perfusion lung scan We'll obtain an overnight sleep oximetry on room air We'll obtain full set of pulmonary function studies

## 2013-03-21 NOTE — Patient Instructions (Signed)
Take prednisone as prescribed Take Augmentin twice daily for 10days A lung function study, a lung perfusion scan and overnight sleep oximetry test will be obtained second week January Keep next scheduled appt

## 2013-03-21 NOTE — Progress Notes (Signed)
Subjective:    Patient ID: Tony Prince, male    DOB: [birth year], 79 y.o.   MRN: [id number]  HPI  79 y.o. WM RUL mass prob CA This patient presented with an episode of cough and hemoptysis 3 weeks prior to this visit. The patient had chest x-ray which showed increased density right upper lobe and the patient is an underwent CT scan of chest which showed probable malignancy right upper lobe. The patient saw thoracic surgery and he recommended a needle biopsy which was nondiagnostic. The patient is now been recommended surgical approach and the patient comes to this office for a second opinion on the possibility of a surgical approach.  Not coughing up any blood last few days. Pt has pndrip Prior sinus infection issues.  Notes some dyspnea with exertion, had this for 7 yrs   10/10/2012 Chief Complaint  Patient presents with  . HFU    Breathing has worsened since having surgery.  Has SOB with any kind of activity, chest pain, PND, and prod cough with clear mucus.  Pt with more dyspnea.  Notes more cough, lots of pndrip. No hemoptysis.  Mucus now is clear.  Drainage from the wound.  Wound has slowed down, has ostomy bag.  Chest tube site.  03/21/2013 Chief Complaint  Patient presents with  . Acute Visit    runny nose, sinus pressure, PND, sore throat, prod cough with clear to yellow mucus, some chest tightness, and increased SOB.  Symptoms started on Friday.  No fever.  Was seen by ENT this am.  Pt notes for three days ?viral illness. Pt saw grandchildren over holidays. Pt saw ENT, scope was done today 12/30>>flu like illness, sore throat, nose drip to chest , sinus, issue, usually is sinusitis full blown.  Prednisone helps.  ENT rx prednisone.  60x 3 40x 3 d  30 x 3 etc. This was phoned. ??ABX   Pt sent a bacterial. Pt saw discoloration, pus seen.  When coughs is pus like mucus. ?bactrim/augmentin.  ENT>>[facility] [place].  [facility] ent. When came off tramadol for pain, end of 12/2012, could  not sleep Still not sleeping. Pt has seen stoneking twice.  Tried melatonin did not work.    Pt notes more dyspnea now but is better from before went to rehab pulm rehab helps.  Past Medical History  Diagnosis Date  . Lung mass   . BPH (benign prostatic hyperplasia)   . Psoriasis   . Osteoporosis   . Anxiety   . Hiatal hernia   . Positional vertigo   . Prolapse of mitral valve     click  . Sebaceous cyst     left shoulder  . Allergic rhinitis   . CAD (coronary artery disease) 08/12/12    LAD calcification noted on CT  . GERD (gastroesophageal reflux disease)     currently not using antacid, hasn't for approx. one yr.   . History of stress test 08/2012    told that it was wnl  . Heart murmur     told that he has murmur by Dr. Pete Glatter   . Sinusitis     frequently  . Inguinal hernia     left side   . Exertional shortness of breath     "last 8-19yrs" (09/14/2012)  . Arthritis     pt. reports that he has aches & pain, unsure if its related to degenerative process     . Pulmonary necrosis     RUL with resection ? underlying  CA, neg pathology for CA     Family History  Problem Relation Age of Onset  . Hyperlipidemia Father   . Hypertension Father   . Diabetes Father   . Colon polyps Mother   . Heart disease Father   . Heart disease Brother     #1  . Heart disease Brother     #2     History   Social History  . Marital Status: Widowed    Spouse Name: N/A    Number of Children: 2  . Years of Education: N/A   Occupational History  . professor phyloscopy and religion     at [employer]   Social History Main Topics  . Smoking status: Former Smoker -- 10 years    Types: Pipe    Quit date: 03/23/1978  . Smokeless tobacco: Never Used  . Alcohol Use: Yes     Comment: 09/14/2012 "less than once per week he'll have a drink"  . Drug Use: No  . Sexual Activity: Not on file   Other Topics Concern  . Not on file   Social History Narrative  . No narrative on file      No Known Allergies   Outpatient Prescriptions Prior to Visit  Medication Sig Dispense Refill  . Calcium Citrate-Vitamin D 315-250 MG-UNIT TABS Take by mouth daily.      . Colloidal Oatmeal (AVEENO ECZEMA THERAPY) 1 % CREA Apply 1 application topically daily as needed (eczema).      . Eyelid Cleansers (SYSTANE LID WIPES EX) Apply 1 application topically every morning. Apply externally to both eyes.      . fish oil-omega-3 fatty acids 1000 MG capsule Take 2 g by mouth daily.       . Hydrocortisone Butyr Lipo Base 0.1 % CREA Apply 1 application topically as needed. psoriasis      . LORazepam (ATIVAN) 0.5 MG tablet Take 0.5 mg by mouth at bedtime.       . Multiple Vitamins-Minerals (ICAPS PO) Take 2 tablets by mouth daily.       . ondansetron (ZOFRAN ODT) 4 MG disintegrating tablet Take 1 tablet (4 mg total) by mouth every 8 (eight) hours as needed for nausea.  10 tablet  0  . Polyethyl Glycol-Propyl Glycol (SYSTANE ULTRA) 0.4-0.3 % SOLN Apply 1 drop to eye 2 (two) times daily as needed (dry eyes).      . polyethylene glycol (MIRALAX / GLYCOLAX) packet Take 17 g by mouth daily as needed.       . triamcinolone (KENALOG) topical spray Apply topically 2 (two) times a week. 1 application      . dexlansoprazole (DEXILANT) 60 MG capsule Take 60 mg by mouth daily.      . traMADol (ULTRAM) 50 MG tablet TAKE 1-2 TABLETS EVERY 6 TO 8 HOURS AS NEEDED FOR PAIN  40 tablet  0  . ibuprofen (ADVIL,MOTRIN) 800 MG tablet Take 800 mg by mouth every 8 hours for 72 hrs, then take 800 mg every 8 hours as needed for pain  30 tablet  0   No facility-administered medications prior to visit.     Review of Systems  Constitutional: Positive for fatigue. Negative for fever, chills, diaphoresis, activity change, appetite change and unexpected weight change.  HENT: Positive for congestion, postnasal drip, rhinorrhea and sinus pressure. Negative for dental problem, ear discharge, ear pain, facial swelling, hearing loss,  mouth sores, nosebleeds, sneezing, sore throat, tinnitus, trouble swallowing and voice change.   Eyes:  Negative for photophobia, discharge, itching and visual disturbance.  Respiratory: Positive for cough, chest tightness and shortness of breath. Negative for apnea, choking, wheezing and stridor.   Cardiovascular: Negative for chest pain, palpitations and leg swelling.  Gastrointestinal: Negative for nausea, vomiting, abdominal pain, constipation, blood in stool and abdominal distention.  Genitourinary: Negative for dysuria, urgency, frequency, hematuria, flank pain, decreased urine volume and difficulty urinating.  Musculoskeletal: Positive for neck pain and neck stiffness. Negative for arthralgias, back pain, gait problem, joint swelling and myalgias.  Skin: Negative for color change, pallor and rash.  Neurological: Negative for dizziness, tremors, seizures, syncope, speech difficulty, weakness, light-headedness, numbness and headaches.  Hematological: Negative for adenopathy. Does not bruise/bleed easily.  Psychiatric/Behavioral: Negative for confusion, sleep disturbance and agitation. The patient is not nervous/anxious.        Objective:   Physical Exam  Filed Vitals:   03/21/13 1547  BP: 112/60  Pulse: 60  Temp: 98.1 F (36.7 C)  TempSrc: Oral  Height: 5\' 11"  (1.803 m)  Weight: 134 lb (60.782 kg)  SpO2: 99%    Gen: Pleasant, well-nourished, in no distress,  normal affect  ENT: No lesions,  mouth clear,  oropharynx clear, no postnasal drip  Neck: No JVD, no TMG, no carotid bruits  Lungs: No use of accessory muscles, no dullness to percussion, clear without rales or rhonchi  Cardiovascular: RRR, heart sounds normal, no murmur or gallops, no peripheral edema  Abdomen: soft and NT, no HSM,  BS normal  Musculoskeletal: No deformities, no cyanosis or clubbing  Neuro: alert, non focal  Skin: Warm, no lesions or rashes  No results found.       Assessment & Plan:    Dyspnea Ongoing dyspnea with exertion with previously documented low diffusion capacity prior to right upper lobe resection Superimposed acute sinusitis Need to rule out perfusion defect her right lung Need to rule out nocturnal hypoxemia with current complaint of insomnia Need to reevaluate lung function status post lobectomy Plan Take prednisone as prescribed Take Augmentin twice daily for 10days Will obtain ventilation perfusion lung scan We'll obtain an overnight sleep oximetry on room air We'll obtain full set of pulmonary function studies  S/P lobectomy of lung Previous right upper lobectomy for necrotizing process right upper lobe without malignancy discerned The patient is completely recovered from the procedure    Updated Medication List Outpatient Encounter Prescriptions as of 03/21/2013  Medication Sig  . Calcium Citrate-Vitamin D 315-250 MG-UNIT TABS Take by mouth daily.  . Colloidal Oatmeal (AVEENO ECZEMA THERAPY) 1 % CREA Apply 1 application topically daily as needed (eczema).  . Eyelid Cleansers (SYSTANE LID WIPES EX) Apply 1 application topically every morning. Apply externally to both eyes.  . fish oil-omega-3 fatty acids 1000 MG capsule Take 2 g by mouth daily.   . Hydrocortisone Butyr Lipo Base 0.1 % CREA Apply 1 application topically as needed. psoriasis  . LORazepam (ATIVAN) 0.5 MG tablet Take 0.5 mg by mouth at bedtime.   . Multiple Vitamins-Minerals (ICAPS PO) Take 2 tablets by mouth daily.   . ondansetron (ZOFRAN ODT) 4 MG disintegrating tablet Take 1 tablet (4 mg total) by mouth every 8 (eight) hours as needed for nausea.  Bertram Gala Glycol-Propyl Glycol (SYSTANE ULTRA) 0.4-0.3 % SOLN Apply 1 drop to eye 2 (two) times daily as needed (dry eyes).  . polyethylene glycol (MIRALAX / GLYCOLAX) packet Take 17 g by mouth daily as needed.   . triamcinolone (KENALOG) topical spray Apply topically 2 (two)  times a week. 1 application  . amoxicillin-clavulanate  (AUGMENTIN) 875-125 MG per tablet Take 1 tablet by mouth 2 (two) times daily.  Marland Kitchen dexlansoprazole (DEXILANT) 60 MG capsule Take 60 mg by mouth daily.  . predniSONE (DELTASONE) 10 MG tablet Take 10 mg by mouth. Taper as directed - was given by ENT on 03/21/13  . traMADol (ULTRAM) 50 MG tablet TAKE 1-2 TABLETS EVERY 6 TO 8 HOURS AS NEEDED FOR PAIN  . [DISCONTINUED] ibuprofen (ADVIL,MOTRIN) 800 MG tablet Take 800 mg by mouth every 8 hours for 72 hrs, then take 800 mg every 8 hours as needed for pain

## 2013-03-22 ENCOUNTER — Other Ambulatory Visit: Payer: Self-pay | Admitting: Critical Care Medicine

## 2013-03-22 DIAGNOSIS — R0689 Other abnormalities of breathing: Secondary | ICD-10-CM

## 2013-03-22 DIAGNOSIS — R0600 Dyspnea, unspecified: Secondary | ICD-10-CM

## 2013-03-23 ENCOUNTER — Encounter (HOSPITAL_COMMUNITY): Payer: Medicare PPO

## 2013-03-23 DIAGNOSIS — C3410 Malignant neoplasm of upper lobe, unspecified bronchus or lung: Secondary | ICD-10-CM | POA: Insufficient documentation

## 2013-03-23 DIAGNOSIS — Z5189 Encounter for other specified aftercare: Secondary | ICD-10-CM | POA: Insufficient documentation

## 2013-03-28 ENCOUNTER — Encounter (HOSPITAL_COMMUNITY): Payer: Medicare PPO

## 2013-03-30 ENCOUNTER — Encounter (HOSPITAL_COMMUNITY): Payer: Medicare PPO

## 2013-03-31 ENCOUNTER — Telehealth: Payer: Self-pay | Admitting: Critical Care Medicine

## 2013-03-31 NOTE — Telephone Encounter (Signed)
Ok to postpone studies until he is better

## 2013-03-31 NOTE — Telephone Encounter (Signed)
I called and spoke with pt. He reports he is scheduled for PFT, lung perfusion, and ONO next week. ONO scheduled for Monday night. He reports he will do this test if Dr. Joya Gaskins wants him too. He states he is not sleeping like he should d/t being on prednisone.  He also has pending appt with PW 04/10/13. He is wanting to r/s these appts and tests to be done in February. He reports he is still battling with a sinus infection (being treated by ENT currently). Please advise Dr. Joya Gaskins thanks

## 2013-03-31 NOTE — Telephone Encounter (Signed)
Called and spoke with pt. He will call back once ready to r/s his tests. I have called and cancelled all his tests scheduled and [facility] regarding ONO. Nothing further needed

## 2013-04-04 ENCOUNTER — Telehealth: Payer: Self-pay | Admitting: Critical Care Medicine

## 2013-04-04 ENCOUNTER — Ambulatory Visit (HOSPITAL_COMMUNITY): Payer: Medicare PPO

## 2013-04-04 ENCOUNTER — Encounter (HOSPITAL_COMMUNITY): Payer: Medicare PPO

## 2013-04-04 NOTE — Telephone Encounter (Signed)
I called and spoke with Maudie Mercury from [facility]. She remembers me speaking with her and cancelling ONO. She delivered all the ONO's yesterday and did not deliver this to pt. She reports [employer] is the only DME that takes [place]. I advised will call [employer]. I spoke with [facility]. She will cancel out the ONO and pt will not be charged. I called pt and made him aware. Nothing further needed

## 2013-04-05 ENCOUNTER — Telehealth (HOSPITAL_COMMUNITY): Payer: Self-pay | Admitting: *Deleted

## 2013-04-05 NOTE — Telephone Encounter (Signed)
Call placed to Tony Prince regarding his absence from Pulmonary Rehab.  He has been sick, finished prescribed medications, however due to a longstanding history of post inflammation of his upper respiratory passages, he is still having lots of difficulty with breathing.  He has requested to return once this has resolved, possibly a few more weeks.  He has attended 21 sessions, we will review again and possible to extend his attendance to 36 total visits  Leverne Humbles RN.

## 2013-04-06 ENCOUNTER — Encounter (HOSPITAL_COMMUNITY): Payer: Medicare PPO

## 2013-04-10 ENCOUNTER — Ambulatory Visit: Payer: Medicare PPO | Admitting: Critical Care Medicine

## 2013-04-11 ENCOUNTER — Other Ambulatory Visit: Payer: Self-pay | Admitting: Thoracic Surgery (Cardiothoracic Vascular Surgery)

## 2013-04-11 ENCOUNTER — Encounter (HOSPITAL_COMMUNITY): Payer: Medicare PPO

## 2013-04-13 ENCOUNTER — Encounter (HOSPITAL_COMMUNITY): Payer: Medicare PPO

## 2013-04-18 ENCOUNTER — Encounter (HOSPITAL_COMMUNITY)
Admission: RE | Admit: 2013-04-18 | Discharge: 2013-04-18 | Disposition: A | Discharge status: Home / Self Care | Payer: Medicare PPO | Source: Ambulatory Visit | Attending: Critical Care Medicine | Admitting: Critical Care Medicine

## 2013-04-20 ENCOUNTER — Encounter (HOSPITAL_COMMUNITY): Payer: Medicare PPO

## 2013-04-25 ENCOUNTER — Encounter (HOSPITAL_COMMUNITY)
Admission: RE | Admit: 2013-04-25 | Discharge: 2013-04-25 | Disposition: A | Discharge status: Home / Self Care | Payer: Medicare PPO | Source: Ambulatory Visit | Attending: Critical Care Medicine | Admitting: Critical Care Medicine

## 2013-04-25 DIAGNOSIS — C3410 Malignant neoplasm of upper lobe, unspecified bronchus or lung: Secondary | ICD-10-CM | POA: Insufficient documentation

## 2013-04-25 DIAGNOSIS — Z5189 Encounter for other specified aftercare: Secondary | ICD-10-CM | POA: Insufficient documentation

## 2013-04-27 ENCOUNTER — Encounter (HOSPITAL_COMMUNITY)
Admission: RE | Admit: 2013-04-27 | Discharge: 2013-04-27 | Disposition: A | Discharge status: Home / Self Care | Payer: Medicare PPO | Source: Ambulatory Visit | Attending: Critical Care Medicine | Admitting: Critical Care Medicine

## 2013-05-02 ENCOUNTER — Encounter (HOSPITAL_COMMUNITY)
Admission: RE | Admit: 2013-05-02 | Discharge: 2013-05-02 | Disposition: A | Discharge status: Home / Self Care | Payer: Medicare PPO | Source: Ambulatory Visit | Attending: Critical Care Medicine | Admitting: Critical Care Medicine

## 2013-05-04 ENCOUNTER — Encounter (HOSPITAL_COMMUNITY)
Admission: RE | Admit: 2013-05-04 | Discharge: 2013-05-04 | Disposition: A | Discharge status: Home / Self Care | Payer: Medicare PPO | Source: Ambulatory Visit | Attending: Critical Care Medicine | Admitting: Critical Care Medicine

## 2013-05-11 ENCOUNTER — Encounter (HOSPITAL_COMMUNITY)
Admission: RE | Admit: 2013-05-11 | Discharge: 2013-05-11 | Disposition: A | Discharge status: Home / Self Care | Payer: Medicare PPO | Source: Ambulatory Visit | Attending: Critical Care Medicine | Admitting: Critical Care Medicine

## 2013-05-16 ENCOUNTER — Encounter (HOSPITAL_COMMUNITY)
Admission: RE | Admit: 2013-05-16 | Discharge: 2013-05-16 | Disposition: A | Discharge status: Home / Self Care | Payer: Medicare PPO | Source: Ambulatory Visit | Attending: Critical Care Medicine | Admitting: Critical Care Medicine

## 2013-05-18 ENCOUNTER — Encounter (HOSPITAL_COMMUNITY): Payer: Medicare PPO

## 2013-05-23 ENCOUNTER — Encounter (HOSPITAL_COMMUNITY)
Admission: RE | Admit: 2013-05-23 | Discharge: 2013-05-23 | Disposition: A | Discharge status: Home / Self Care | Payer: Medicare PPO | Source: Ambulatory Visit | Attending: Critical Care Medicine | Admitting: Critical Care Medicine

## 2013-05-23 DIAGNOSIS — C3410 Malignant neoplasm of upper lobe, unspecified bronchus or lung: Secondary | ICD-10-CM | POA: Insufficient documentation

## 2013-05-23 DIAGNOSIS — Z5189 Encounter for other specified aftercare: Secondary | ICD-10-CM | POA: Insufficient documentation

## 2013-05-25 ENCOUNTER — Encounter (HOSPITAL_COMMUNITY)
Admission: RE | Admit: 2013-05-25 | Discharge: 2013-05-25 | Disposition: A | Discharge status: Home / Self Care | Payer: Medicare PPO | Source: Ambulatory Visit | Attending: Critical Care Medicine | Admitting: Critical Care Medicine

## 2013-05-30 ENCOUNTER — Encounter (HOSPITAL_COMMUNITY)
Admission: RE | Admit: 2013-05-30 | Discharge: 2013-05-30 | Disposition: A | Discharge status: Home / Self Care | Payer: Medicare PPO | Source: Ambulatory Visit | Attending: Critical Care Medicine | Admitting: Critical Care Medicine

## 2013-05-31 ENCOUNTER — Telehealth: Payer: Self-pay | Admitting: Critical Care Medicine

## 2013-05-31 NOTE — Telephone Encounter (Signed)
Spoke with pt  - PW had ordered pt to have cxr, vq scan, ONO  And PFT.  Pt got sick and was unable to have all these done and would like to reschedule.

## 2013-05-31 NOTE — Telephone Encounter (Signed)
CXR , VQ Scan and PFT has been scheduled at [facility] for 06/07/13 at 10:00. Pt to arrive at 9:15 and check in at admissions for PFT and then go to Radiology to have CXR and VQ at 9:30. PFT will be preformed after the VQ scan at 11:00.  Pt stated that [employer] just dropped off the ONO machine on his front door and left it. Pt stated that he did not receive a call or any instructions on how to use the device. ONO order has been re-faxed to [facility] and requested that they contact the patient first to schedule and review with him how to use the device the night of the study. Called and spoke with patient and he is aware of both appointments, date, time and location. I have mailed the instruction sheet for the PFT's to the patient. Pt was then transferred up front to schedule a follow up appointment with Dr. Joya Gaskins.  Return appointment scheduled with Dr. Joya Gaskins for 06/20/13 at 10:45 at the [facility] location. Nothing else needed at this time. Rhonda J Cobb

## 2013-06-01 ENCOUNTER — Encounter (HOSPITAL_COMMUNITY)
Admission: RE | Admit: 2013-06-01 | Discharge: 2013-06-01 | Disposition: A | Discharge status: Home / Self Care | Payer: Medicare PPO | Source: Ambulatory Visit | Attending: Critical Care Medicine | Admitting: Critical Care Medicine

## 2013-06-06 ENCOUNTER — Encounter (HOSPITAL_COMMUNITY)
Admission: RE | Admit: 2013-06-06 | Discharge: 2013-06-06 | Disposition: A | Discharge status: Home / Self Care | Payer: Medicare PPO | Source: Ambulatory Visit | Attending: Critical Care Medicine | Admitting: Critical Care Medicine

## 2013-06-06 ENCOUNTER — Encounter (HOSPITAL_COMMUNITY): Payer: Self-pay

## 2013-06-06 NOTE — Progress Notes (Signed)
Today, Tony Prince exercised at [employer].  The patient exercised for more than 31 minutes on different pieces of equipment. Oxygen saturation, heart rate, blood pressure, rate of perceived exertion, and shortness of breath were all monitored before, during, and after exercise. Tony Prince presented with no problems at today's exercise session.    Dr. Brand Males, Medical Director Dr. Dyann Kief is immediately available during today's Pulmonary Rehab session for [facility].

## 2013-06-07 ENCOUNTER — Ambulatory Visit (HOSPITAL_COMMUNITY)
Admission: RE | Admit: 2013-06-07 | Discharge: 2013-06-07 | Disposition: A | Discharge status: Home / Self Care | Payer: Medicare PPO | Source: Ambulatory Visit | Attending: Critical Care Medicine | Admitting: Critical Care Medicine

## 2013-06-07 DIAGNOSIS — R0600 Dyspnea, unspecified: Secondary | ICD-10-CM

## 2013-06-07 DIAGNOSIS — R0689 Other abnormalities of breathing: Principal | ICD-10-CM

## 2013-06-07 DIAGNOSIS — M4120 Other idiopathic scoliosis, site unspecified: Secondary | ICD-10-CM | POA: Insufficient documentation

## 2013-06-07 DIAGNOSIS — R0602 Shortness of breath: Secondary | ICD-10-CM | POA: Insufficient documentation

## 2013-06-07 LAB — PULMONARY FUNCTION TEST
DL/VA % pred: 77 %
DL/VA: 3.62 ml/min/mmHg/L
DLCO unc % pred: 51 %
DLCO unc: 17.22 ml/min/mmHg
FEF 25-75 PRE: 1.55 L/s
FEF 25-75 Post: 1.51 L/sec
FEF2575-%Change-Post: -2 %
FEF2575-%PRED-PRE: 71 %
FEF2575-%Pred-Post: 69 %
FEV1-%CHANGE-POST: 0 %
FEV1-%Pred-Post: 73 %
FEV1-%Pred-Pre: 72 %
FEV1-PRE: 2.24 L
FEV1-Post: 2.25 L
FEV1FVC-%Change-Post: 0 %
FEV1FVC-%PRED-PRE: 98 %
FEV6-%Change-Post: 0 %
FEV6-%PRED-POST: 78 %
FEV6-%Pred-Pre: 79 %
FEV6-POST: 3.14 L
FEV6-Pre: 3.17 L
FEV6FVC-%PRED-PRE: 106 %
FEV6FVC-%Pred-Post: 106 %
FVC-%CHANGE-POST: 0 %
FVC-%PRED-POST: 73 %
FVC-%PRED-PRE: 74 %
FVC-POST: 3.14 L
FVC-Pre: 3.17 L
Post FEV1/FVC ratio: 71 %
Post FEV6/FVC ratio: 100 %
Pre FEV1/FVC ratio: 71 %
Pre FEV6/FVC Ratio: 100 %
RV % pred: 69 %
RV: 1.85 L
TLC % PRED: 67 %
TLC: 4.93 L

## 2013-06-07 MED ORDER — TECHNETIUM TO 99M ALBUMIN AGGREGATED
5.0000 | Freq: Once | INTRAVENOUS | Status: AC | PRN
  Administered 2013-06-07: 5 via INTRAVENOUS

## 2013-06-07 MED ORDER — ALBUTEROL SULFATE (2.5 MG/3ML) 0.083% IN NEBU
2.5000 mg | INHALATION_SOLUTION | Freq: Once | RESPIRATORY_TRACT | Status: AC
  Administered 2013-06-07: 2.5 mg via RESPIRATORY_TRACT

## 2013-06-07 MED ORDER — TECHNETIUM TC 99M DIETHYLENETRIAME-PENTAACETIC ACID: 40.0000 | Freq: Once | INTRAVENOUS | Status: DC | PRN

## 2013-06-08 ENCOUNTER — Encounter (HOSPITAL_COMMUNITY)
Admission: RE | Admit: 2013-06-08 | Discharge: 2013-06-08 | Disposition: A | Discharge status: Home / Self Care | Payer: Medicare PPO | Source: Ambulatory Visit | Attending: Critical Care Medicine | Admitting: Critical Care Medicine

## 2013-06-08 ENCOUNTER — Encounter (HOSPITAL_COMMUNITY): Payer: Self-pay | Admitting: *Deleted

## 2013-06-08 NOTE — Progress Notes (Signed)
Today, Rogene Houston exercised at [employer]. [facility].  The patient exercised for more than 31 minutes on different pieces of equipment. Oxygen saturation, heart rate, blood pressure, rate of perceived exertion, and shortness of breath were all monitored before, during, and after exercise. Dhanush presented with no problems at today's exercise session.   Patient attended MD lecture day with Dr. Alva Garnet. Dr. Brand Males, Medical Director Dr. Aileen Fass is immediately available during today's Pulmonary Rehab session for [facility].

## 2013-06-12 NOTE — Progress Notes (Signed)
Quick Note:  Notify the patient that the Xray is stable and no new areas of abnormality No change in medications are recommended. ______

## 2013-06-13 ENCOUNTER — Encounter (HOSPITAL_COMMUNITY)
Admission: RE | Admit: 2013-06-13 | Discharge: 2013-06-13 | Disposition: A | Discharge status: Home / Self Care | Payer: Medicare PPO | Source: Ambulatory Visit | Attending: Critical Care Medicine | Admitting: Critical Care Medicine

## 2013-06-13 NOTE — Progress Notes (Signed)
Today, Tony Prince exercised at [employer]. Tony Prince Tony Prince. Service time was from 1400 to 1530.  The patient exercised for more than 31 minutes on different pieces of equipment. Oxygen saturation, heart rate, blood pressure, rate of perceived exertion, and shortness of breath were all monitored before, during, and after exercise. Tony Prince presented with no problems at today's exercise session.   Pre-exercise vitals:   Weight: 62.1 kg   Liters of O2: RA   SpO2: 100   HR: 51   BP: 96/60   CGB: n/a  Exercise vitals:   Highest heartrate:  117   Lowest oxygen saturation: 94% RA   Highest blood pressure: 150/80   Liters of O2: RA  Post-exercise vitals:   SpO2: 96 RA   HR: 79   BP: 104/64   CGB: N/A Tony Prince, Medical Director Tony Prince is immediately available during today's Tony Prince session for Tony Prince.

## 2013-06-14 NOTE — Progress Notes (Signed)
Quick Note:  Called, spoke with pt. Informed him of results per Dr. Joya Gaskins. He verbalized understanding and voiced no further questions or concerns at this time. ______

## 2013-06-14 NOTE — Progress Notes (Signed)
Quick Note:  Called, spoke with pt. Informed him of pft results and recs per Dr. Joya Gaskins. He verbalized understanding and has a pending OV with PW on March 31 -- pt is aware of pending OV. ______

## 2013-06-14 NOTE — Progress Notes (Signed)
Quick Note:  Called, spoke with pt. Informed him of cxr results and recs per Dr. Joya Gaskins. He verbalized understanding. ______

## 2013-06-15 ENCOUNTER — Encounter (HOSPITAL_COMMUNITY): Payer: Self-pay | Admitting: *Deleted

## 2013-06-15 ENCOUNTER — Encounter (HOSPITAL_COMMUNITY)
Admission: RE | Admit: 2013-06-15 | Discharge: 2013-06-15 | Disposition: A | Discharge status: Home / Self Care | Payer: Medicare PPO | Source: Ambulatory Visit | Attending: Critical Care Medicine | Admitting: Critical Care Medicine

## 2013-06-15 NOTE — Progress Notes (Signed)
Today, Tony Prince exercised at [employer]. [facility]. Service time was from 1330 to 1530.  The patient exercised for more than 31 minutes on different pieces of equipment. Elex also performed strength training for 10 minutes.  Oxygen saturation, heart rate, blood pressure, rate of perceived exertion, and shortness of breath were all monitored before, during, and after exercise. Tony Prince presented with no problems at today's exercise session. Tony Prince attended the Social worker.  Pre-exercise vitals:   Weight kg: 62.3   Liters of O2:RA    SpO2: 98   HR: 56   BP: 122/58   CGB: NA  Exercise vitals:   Highest heartrate:  106   Lowest oxygen saturation: 98   Highest blood pressure: 122/58   Liters of O2: RA  Post-exercise vitals:   SpO2: 98   HR: 76   BP: 120/80   CGB: NA Dr. Brand Males, Medical Director Dr. Ree Kida is immediately available during today's Pulmonary Rehab session for Tony Prince.

## 2013-06-20 ENCOUNTER — Ambulatory Visit (INDEPENDENT_AMBULATORY_CARE_PROVIDER_SITE_OTHER): Payer: Medicare PPO | Admitting: Critical Care Medicine

## 2013-06-20 ENCOUNTER — Encounter (HOSPITAL_COMMUNITY): Payer: Medicare PPO

## 2013-06-20 VITALS — BP 120/64 | HR 53 | Temp 97.4°F | Ht 71.0 in | Wt 128.8 lb

## 2013-06-20 DIAGNOSIS — J852 Abscess of lung without pneumonia: Secondary | ICD-10-CM

## 2013-06-20 DIAGNOSIS — J850 Gangrene and necrosis of lung: Secondary | ICD-10-CM

## 2013-06-20 NOTE — Progress Notes (Signed)
Subjective:    Patient ID: Tony Prince, male    DOB: June 16, 1932, 80 y.o.   MRN: [id number]  HPI  80 y.o. WM RUL mass prob CA This patient presented with an episode of cough and hemoptysis 3 weeks prior to this visit. The patient had chest x-ray which showed increased density right upper lobe and the patient is an underwent CT scan of chest which showed probable malignancy right upper lobe. The patient saw thoracic surgery and he recommended a needle biopsy which was nondiagnostic. The patient is now been recommended surgical approach and the patient comes to this office for a second opinion on the possibility of a surgical approach.  Not coughing up any blood last few days. Pt has pndrip Prior sinus infection issues.  Notes some dyspnea with exertion, had this for 7 yrs   10/10/2012 Chief Complaint  Patient presents with  . HFU    Breathing has worsened since having surgery.  Has SOB with any kind of activity, chest pain, PND, and prod cough with clear mucus.  Pt with more dyspnea.  Notes more cough, lots of pndrip. No hemoptysis.  Mucus now is clear.  Drainage from the wound.  Wound has slowed down, has ostomy bag.  Chest tube site.  03/21/2013 Chief Complaint  Patient presents with  . Acute Visit    runny nose, sinus pressure, PND, sore throat, prod cough with clear to yellow mucus, some chest tightness, and increased SOB.  Symptoms started on Friday.  No fever.  Was seen by ENT this am.  Pt notes for three days ?viral illness. Pt saw grandchildren over holidays. Pt saw ENT, scope was done today 12/30>>flu like illness, sore throat, nose drip to chest , sinus, issue, usually is sinusitis full blown.  Prednisone helps.  ENT rx prednisone.  60x 3 40x 3 d  30 x 3 etc. This was phoned. ??ABX   Pt sent a bacterial. Pt saw discoloration, pus seen.  When coughs is pus like mucus. ?bactrim/augmentin.  ENT>>[facility] [place].  [facility] ent. When came off tramadol for pain, end of 12/2012, could  not sleep Still not sleeping. Pt has seen stoneking twice.  Tried melatonin did not work.    Pt notes more dyspnea now but is better from before went to rehab pulm rehab helps.  06/20/2013 Chief Complaint  Patient presents with  . Follow-up    F/U after cxr, vq scan and pft - feeling good - SOB about the same - Finishing up pulmonary rehab - Denies cough or chest discomfort  Pt in rehab.  More intense work vs [employer].  Now on step machine: up to 10 .  Does well at 60steps per minute.  Ok on ellipital to 132 Sats ok during rehab Pt has dyspnea with inactivity. Getting out of care and walking  Chronic sinus issues.   Gets frequent sinus infections   Past Medical History  Diagnosis Date  . Lung mass   . BPH (benign prostatic hyperplasia)   . Psoriasis   . Osteoporosis   . Anxiety   . Hiatal hernia   . Positional vertigo   . Prolapse of mitral valve     click  . Sebaceous cyst     left shoulder  . Allergic rhinitis   . CAD (coronary artery disease) 08/12/12    LAD calcification noted on CT  . GERD (gastroesophageal reflux disease)     currently not using antacid, hasn't for approx. one yr.   . History of  stress test 08/2012    told that it was wnl  . Heart murmur     told that he has murmur by Dr. Felipa Eth   . Sinusitis     frequently  . Inguinal hernia     left side   . Exertional shortness of breath     "last 8-28yrs" (09/14/2012)  . Arthritis     pt. reports that he has aches & pain, unsure if its related to degenerative process     . Pulmonary necrosis     RUL with resection ? underlying CA, neg pathology for CA     Family History  Problem Relation Age of Onset  . Hyperlipidemia Father   . Hypertension Father   . Diabetes Father   . Colon polyps Mother   . Heart disease Father   . Heart disease Brother     #1  . Heart disease Brother     #2     History   Social History  . Marital Status: Widowed    Spouse Name: N/A    Number of Children: 2  . Years of  Education: N/A   Occupational History  . professor phyloscopy and religion     at [employer] History Main Topics  . Smoking status: Former Smoker -- 10 years    Types: Pipe    Quit date: 03/23/1978  . Smokeless tobacco: Never Used  . Alcohol Use: Yes     Comment: 09/14/2012 "less than once per week he'll have a drink"  . Drug Use: No  . Sexual Activity: Not on file   Other Topics Concern  . Not on file   Social History Narrative  . No narrative on file     No Known Allergies   Outpatient Prescriptions Prior to Visit  Medication Sig Dispense Refill  . Calcium Citrate-Vitamin D 315-250 MG-UNIT TABS Take by mouth daily.      . Colloidal Oatmeal (AVEENO ECZEMA THERAPY) 1 % CREA Apply 1 application topically daily as needed (eczema).      . Eyelid Cleansers (SYSTANE LID WIPES EX) Apply 1 application topically every morning. Apply externally to both eyes.      . fish oil-omega-3 fatty acids 1000 MG capsule Take 2 g by mouth daily.       . Hydrocortisone Butyr Lipo Base 0.1 % CREA Apply 1 application topically as needed. psoriasis      . LORazepam (ATIVAN) 0.5 MG tablet Take 0.5 mg by mouth at bedtime.       . Multiple Vitamins-Minerals (ICAPS PO) Take 2 tablets by mouth daily.       . ondansetron (ZOFRAN ODT) 4 MG disintegrating tablet Take 1 tablet (4 mg total) by mouth every 8 (eight) hours as needed for nausea.  10 tablet  0  . Polyethyl Glycol-Propyl Glycol (SYSTANE ULTRA) 0.4-0.3 % SOLN Apply 1 drop to eye 2 (two) times daily as needed (dry eyes).      . polyethylene glycol (MIRALAX / GLYCOLAX) packet Take 17 g by mouth daily as needed.       . triamcinolone (KENALOG) topical spray Apply topically 2 (two) times a week. 1 application      . traMADol (ULTRAM) 50 MG tablet TAKE 1-2 TABLETS EVERY 6 TO 8 HOURS AS NEEDED FOR PAIN  40 tablet  0  . amoxicillin-clavulanate (AUGMENTIN) 875-125 MG per tablet Take 1 tablet by mouth 2 (two) times daily.  20 tablet  0  .  dexlansoprazole (DEXILANT) 60 MG capsule Take 60 mg by mouth daily.      . predniSONE (DELTASONE) 10 MG tablet Take 10 mg by mouth. Taper as directed - was given by ENT on 03/21/13       No facility-administered medications prior to visit.     Review of Systems  Constitutional: Positive for fatigue. Negative for fever, chills, diaphoresis, activity change, appetite change and unexpected weight change.  HENT: Positive for congestion, postnasal drip, rhinorrhea and sinus pressure. Negative for dental problem, ear discharge, ear pain, facial swelling, hearing loss, mouth sores, nosebleeds, sneezing, sore throat, tinnitus, trouble swallowing and voice change.   Eyes: Negative for photophobia, discharge, itching and visual disturbance.  Respiratory: Positive for cough, chest tightness and shortness of breath. Negative for apnea, choking, wheezing and stridor.   Cardiovascular: Negative for chest pain, palpitations and leg swelling.  Gastrointestinal: Negative for nausea, vomiting, abdominal pain, constipation, blood in stool and abdominal distention.  Genitourinary: Negative for dysuria, urgency, frequency, hematuria, flank pain, decreased urine volume and difficulty urinating.  Musculoskeletal: Positive for neck pain and neck stiffness. Negative for arthralgias, back pain, gait problem, joint swelling and myalgias.  Skin: Negative for color change, pallor and rash.  Neurological: Negative for dizziness, tremors, seizures, syncope, speech difficulty, weakness, light-headedness, numbness and headaches.  Hematological: Negative for adenopathy. Does not bruise/bleed easily.  Psychiatric/Behavioral: Negative for confusion, sleep disturbance and agitation. The patient is not nervous/anxious.        Objective:   Physical Exam  Filed Vitals:   06/20/13 1044  BP: 120/64  Pulse: 53  Temp: 97.4 F (36.3 C)  TempSrc: Oral  Height: 5\' 11"  (1.803 m)  Weight: 128 lb 12.8 oz (58.423 kg)  SpO2: 100%     Gen: Pleasant, well-nourished, in no distress,  normal affect  ENT: No lesions,  mouth clear,  oropharynx clear, no postnasal drip  Neck: No JVD, no TMG, no carotid bruits  Lungs: No use of accessory muscles, no dullness to percussion, clear without rales or rhonchi  Cardiovascular: RRR, heart sounds normal, no murmur or gallops, no peripheral edema  Abdomen: soft and NT, no HSM,  BS normal  Musculoskeletal: No deformities, no cyanosis or clubbing  Neuro: alert, non focal  Skin: Warm, no lesions or rashes  No results found.       Assessment & Plan:   No problem-specific assessment & plan notes found for this encounter.   Updated Medication List Outpatient Encounter Prescriptions as of 06/20/2013  Medication Sig  . Calcium Citrate-Vitamin D 315-250 MG-UNIT TABS Take by mouth daily.  . Colloidal Oatmeal (AVEENO ECZEMA THERAPY) 1 % CREA Apply 1 application topically daily as needed (eczema).  . Eyelid Cleansers (SYSTANE LID WIPES EX) Apply 1 application topically every morning. Apply externally to both eyes.  . fish oil-omega-3 fatty acids 1000 MG capsule Take 2 g by mouth daily.   . fluocinonide cream (LIDEX) 0.05 % Apply to affected area as needed  . Hydrocortisone Butyr Lipo Base 0.1 % CREA Apply 1 application topically as needed. psoriasis  . LORazepam (ATIVAN) 0.5 MG tablet Take 0.5 mg by mouth at bedtime.   . Multiple Vitamins-Minerals (ICAPS PO) Take 2 tablets by mouth daily.   . ondansetron (ZOFRAN ODT) 4 MG disintegrating tablet Take 1 tablet (4 mg total) by mouth every 8 (eight) hours as needed for nausea.  [name] Glycol-Propyl Glycol (SYSTANE ULTRA) 0.4-0.3 % SOLN Apply 1 drop to eye 2 (two) times daily as needed (dry  eyes).  . polyethylene glycol (MIRALAX / GLYCOLAX) packet Take 17 g by mouth daily as needed.   . triamcinolone (KENALOG) topical spray Apply topically 2 (two) times a week. 1 application  . traMADol (ULTRAM) 50 MG tablet TAKE 1-2 TABLETS  EVERY 6 TO 8 HOURS AS NEEDED FOR PAIN  . [DISCONTINUED] amoxicillin-clavulanate (AUGMENTIN) 875-125 MG per tablet Take 1 tablet by mouth 2 (two) times daily.  . [DISCONTINUED] dexlansoprazole (DEXILANT) 60 MG capsule Take 60 mg by mouth daily.  . [DISCONTINUED] predniSONE (DELTASONE) 10 MG tablet Take 10 mg by mouth. Taper as directed - was given by ENT on 03/21/13

## 2013-06-20 NOTE — Patient Instructions (Signed)
No change in medications. Return in        6 months        

## 2013-06-21 NOTE — Assessment & Plan Note (Addendum)
Hx of RUL lobectomy for pulm necrosis, no CA seen REstrictive defect persists No obstruction No need for inhaled medications

## 2013-06-22 ENCOUNTER — Encounter (HOSPITAL_COMMUNITY)
Admission: RE | Admit: 2013-06-22 | Discharge: 2013-06-22 | Disposition: A | Discharge status: Home / Self Care | Payer: Medicare PPO | Source: Ambulatory Visit | Attending: Critical Care Medicine | Admitting: Critical Care Medicine

## 2013-06-22 ENCOUNTER — Encounter (HOSPITAL_COMMUNITY): Payer: Self-pay

## 2013-06-22 DIAGNOSIS — C3410 Malignant neoplasm of upper lobe, unspecified bronchus or lung: Secondary | ICD-10-CM | POA: Insufficient documentation

## 2013-06-22 DIAGNOSIS — Z5189 Encounter for other specified aftercare: Secondary | ICD-10-CM | POA: Insufficient documentation

## 2013-06-22 NOTE — Progress Notes (Signed)
Tony Prince completed a Six-Minute Walk Test on 06/22/13 . Tony Prince walked 1,916 feet with 0 breaks. The patient was on 0 liters. Tony Prince stated that back and leg pain hindered their walk test.

## 2013-06-27 ENCOUNTER — Encounter (HOSPITAL_COMMUNITY): Payer: Medicare PPO

## 2013-06-29 ENCOUNTER — Encounter (HOSPITAL_COMMUNITY): Payer: Medicare PPO

## 2013-07-19 ENCOUNTER — Other Ambulatory Visit: Payer: Self-pay | Admitting: *Deleted

## 2013-07-19 DIAGNOSIS — R918 Other nonspecific abnormal finding of lung field: Secondary | ICD-10-CM

## 2013-07-20 ENCOUNTER — Other Ambulatory Visit: Payer: Self-pay | Admitting: *Deleted

## 2013-07-20 DIAGNOSIS — D381 Neoplasm of uncertain behavior of trachea, bronchus and lung: Secondary | ICD-10-CM

## 2013-09-05 ENCOUNTER — Ambulatory Visit: Payer: Medicare PPO | Admitting: Thoracic Surgery (Cardiothoracic Vascular Surgery)

## 2013-09-05 ENCOUNTER — Other Ambulatory Visit: Payer: Medicare PPO

## 2013-11-07 ENCOUNTER — Ambulatory Visit
Admission: RE | Admit: 2013-11-07 | Discharge: 2013-11-07 | Disposition: A | Discharge status: Home / Self Care | Payer: Medicare PPO | Source: Ambulatory Visit | Attending: Thoracic Surgery (Cardiothoracic Vascular Surgery) | Admitting: Thoracic Surgery (Cardiothoracic Vascular Surgery)

## 2013-11-07 ENCOUNTER — Encounter: Payer: Self-pay | Admitting: Thoracic Surgery (Cardiothoracic Vascular Surgery)

## 2013-11-07 ENCOUNTER — Ambulatory Visit (INDEPENDENT_AMBULATORY_CARE_PROVIDER_SITE_OTHER): Payer: Medicare PPO | Admitting: Thoracic Surgery (Cardiothoracic Vascular Surgery)

## 2013-11-07 VITALS — BP 140/70 | HR 50 | Resp 20 | Ht 71.0 in | Wt 128.0 lb

## 2013-11-07 DIAGNOSIS — D381 Neoplasm of uncertain behavior of trachea, bronchus and lung: Secondary | ICD-10-CM

## 2013-11-07 DIAGNOSIS — J852 Abscess of lung without pneumonia: Secondary | ICD-10-CM

## 2013-11-07 DIAGNOSIS — Z9889 Other specified postprocedural states: Secondary | ICD-10-CM

## 2013-11-07 DIAGNOSIS — Z902 Acquired absence of lung [part of]: Secondary | ICD-10-CM

## 2013-11-07 NOTE — Progress Notes (Signed)
HPI:  Tony Prince returns for a scheduled followup visit.  He is a 80 year old gentleman who presented about a year ago with a right upper lobe mass. We did a right upper lobectomy which required an extra pleural resection in June 2014. The mass turned out to be inflammatory and not malignant. He had a lot of difficulty with pain initially. He also had difficulty with shortness of breath.  I last saw him in the office in December of 2014.  Since that time he says he's been doing well. He says that his shortness of breath is at his baseline where was prior to his lobectomy. He will get short of breath when he first gets up and then with time it improves. He does not have any residual thoracotomy pain. He is exercising on a regular basis.  Past Medical History  Diagnosis Date  . Lung mass   . BPH (benign prostatic hyperplasia)   . Psoriasis   . Osteoporosis   . Anxiety   . Hiatal hernia   . Positional vertigo   . Prolapse of mitral valve     click  . Sebaceous cyst     left shoulder  . Allergic rhinitis   . CAD (coronary artery disease) 08/12/12    LAD calcification noted on CT  . GERD (gastroesophageal reflux disease)     currently not using antacid, hasn't for approx. one yr.   . History of stress test 08/2012    told that it was wnl  . Heart murmur     told that he has murmur by Dr. Felipa Eth   . Sinusitis     frequently  . Inguinal hernia     left side   . Exertional shortness of breath     "last 8-73yrs" (09/14/2012)  . Arthritis     pt. reports that he has aches & pain, unsure if its related to degenerative process     . Pulmonary necrosis     RUL with resection ? underlying CA, neg pathology for CA       Current Outpatient Prescriptions  Medication Sig Dispense Refill  . Calcium Citrate-Vitamin D 315-250 MG-UNIT TABS Take by mouth daily.      . Colloidal Oatmeal (AVEENO ECZEMA THERAPY) 1 % CREA Apply 1 application topically daily as needed (eczema).      . Eyelid  Cleansers (SYSTANE LID WIPES EX) Apply 1 application topically every morning. Apply externally to both eyes.      . fish oil-omega-3 fatty acids 1000 MG capsule Take 2 g by mouth daily.       . fluocinonide cream (LIDEX) 0.05 % Apply to affected area as needed      . Hydrocortisone Butyr Lipo Base 0.1 % CREA Apply 1 application topically as needed. psoriasis      . LORazepam (ATIVAN) 0.5 MG tablet Take 0.5 mg by mouth at bedtime.       . Multiple Vitamins-Minerals (ICAPS PO) Take 2 tablets by mouth daily.       Tony Prince (SYSTANE ULTRA) 0.4-0.3 % SOLN Apply 1 drop to eye 2 (two) times daily as needed (dry eyes).      . polyethylene Prince (MIRALAX / GLYCOLAX) packet Take 17 g by mouth daily as needed.       . triamcinolone (KENALOG) topical spray Apply topically 2 (two) times a week. 1 application       No current facility-administered medications for this visit.    Physical Exam  BP 140/70  Pulse 50  Resp 20  Ht 5\' 11"  (1.803 m)  Wt 128 lb (58.06 kg)  BMI 17.86 kg/m2  SpO65 63% 80 year old man in no acute distress Thin Alert and oriented x3 with no neurologic deficits Lungs clear, essentially equal breath sounds bilaterally Cardiac regular rate and rhythm No cervical or suprapubic or adenopathy  Diagnostic Tests: CT chest 11/07/2013 COMPARISON: Chest CT 08/12/2012. PET CT 08/22/2012.  FINDINGS:  Postoperative changes in the right apex. Previously seen right  apical mass no longer visualized. Linear areas of scarring. No  recurrent or residual mass. Linear scarring in both lung bases. No  new or enlarging pulmonary nodules. No pleural effusions.  Heart is borderline in size. Aorta is normal caliber. Diffuse  calcifications in the left anterior descending and right coronary  arteries. No mediastinal, hilar, or axillary adenopathy. Chest wall  soft tissues are unremarkable. Imaging into the upper abdomen shows  no acute findings. Posterior proximal stomach  diverticulum noted.  No acute bony abnormality. Rightward scoliosis in the mid thoracic  spine.  IMPRESSION:  Postoperative changes in the right upper lung. Areas of scarring  without residual recurrent mass.  Coronary artery disease.  No acute findings.  Electronically Signed  By: Tony Baptise M.D.  On: 11/07/2013 12:16   Impression: 80 year old gentleman who now is about a year out from resection of a right upper lobe mass. This required extra-pleural lobectomy. On pathology it turned out to be all inflammatory with no evidence of cancer.  Now at a year he is doing well. He says that he doesn't really notice a difference in his breathing compared to his baseline prior to surgery.  CT shows postoperative changes, but no suspicious findings.  Plan: No further followup for this issue is necessary.  He will continue to see Drs. Stoneking and Joya Gaskins  I will be happy to see Tony Prince back any time if I can be of any further assistance with his care

## 2013-12-07 ENCOUNTER — Ambulatory Visit (INDEPENDENT_AMBULATORY_CARE_PROVIDER_SITE_OTHER): Payer: Medicare PPO | Admitting: Podiatry

## 2013-12-07 ENCOUNTER — Encounter: Payer: Self-pay | Admitting: Podiatry

## 2013-12-07 VITALS — BP 146/76 | HR 60 | Resp 16

## 2013-12-07 DIAGNOSIS — L84 Corns and callosities: Secondary | ICD-10-CM

## 2013-12-07 DIAGNOSIS — M779 Enthesopathy, unspecified: Secondary | ICD-10-CM

## 2013-12-07 MED ORDER — TRIAMCINOLONE ACETONIDE 10 MG/ML IJ SUSP
10.0000 mg | Freq: Once | INTRAMUSCULAR | Status: AC
  Administered 2013-12-07: 10 mg

## 2013-12-07 NOTE — Patient Instructions (Signed)
Corns and Calluses A thickening of the skin layer (usually over bony areas, such as toe joints) is known as a corn. Two types of corns exist: hard corns and soft corns. Calluses are painless areas of skin thickening that are caused by repeated pressure or irritation. Corns tend to affect toe joints and the skin between the toes; whereas, a callus can appear on any part of the body (especially the hands, feet, or knees).  SYMPTOMS   Corn:  Presence of a small (1/8 to 3/8 inch [3 to 10 mm in diameter]), painful bump on the side or over the joint of a toe.  Hard corns are more common on the outer portion of the little (fifth) toe at the joint.  Soft corns are more common between bony bumps (prominences), usually between the fourth and fifth toes or between the second and third toes.  Callus:  A rough, thickened area of skin that appears after repeated pressure or irritation. CAUSES  The purpose of corns and calluses is to protect an area of skin from injury caused by repeated irritation (rubbing or squeezing). The presence of pressure causes the skin cells to grow at a faster rate than the cells of unaffected areas. This leads to an overgrowth (corn or callus). As apposed to hard corns, soft corns tend to develop between toes, because there is more moisture. Soft corns are often the result of prolonged shoe wear, which leads to increased perspiration and moisture.  RISK INCREASES WITH:  Shoes that are too tight.  Occupations or sports that involve repetitive pressure on the hands (racquetball and baseball) or sudden stops on hard surfaces (track and tennis).  Sports that require the athlete to wear shoes, perspire, or wear clothing or protective gear that causes the production of heat and friction. PREVENTION  Properly fitted shoes and equipment.  Modify activities to prevent constant pressure on specific areas of skin.  If possible, wear padding over areas of skin that are exposed to  repeated pressure or irritation.  Keep the area between the toes dry (with powder or by removing shoes often).  Relieve shoe pressure by stretching the areas of the shoe that cause the pressure and or use ointments to soften leather shoes. PROGNOSIS  Corns and calluses typically subside if the activity that causes them is eliminated. Recovery may take up to 3 weeks. Recurrence is likely even with treatment if the cause is not removed.  RELATED COMPLICATIONS  If one overcompensates in an attempt to avoid pain, he or she may experience pain in other areas due to the changes in body movements (mechanics). TREATMENT  The best way to treat corns and calluses is to remove the source of pressure. Corn and callus pads may be helpful in reducing pressure on the affected skin. For soft corns, try to keep the affected area dry. If you cannot find shoes that fit properly, a shoe repair shop may be able to alter your shoes to reduce pressure. Occasionally a cushion for the bottom of the foot (metatarsal bar) worn within the shoe may relieve pressure on corns or calluses of the foot. For calluses, you may be able to peel or rub the thickened area with a pumice stone, sandstone, callus file, or with sandpaper to remove the callus; wetting the affected area may make this process more effective. Do not cut the corn or callus with a razor or knife. If the corn or callus must be removed, then a medically trained person should perform  the procedure. After peeling away the upper layers of a corn once or twice a day, it may be recommended to apply a non-prescription 5% to 17% salicylic ointment and cover the area with a bandage. It very uncommon to have the bony bumps (at toe joints) surgically removed. MEDICATION   If pain medication is necessary, nonsteroidal anti-inflammatory medications, such as aspirin and ibuprofen, or other minor pain relievers, such as acetaminophen, are often recommended. Contact your caregiver  immediately if any bleeding, stomach upset, or signs of an allergic reaction occur.  Topical salicylic ointments (5% to 10%) may be of benefit.  Prescription pain medications may be given by a caregiver. Use only as directed and only as much as you need.  Soak the foot for 20 minutes, twice a day, in a gallon of warm water. This may help to soften corns and calluses. Care should be taken to thoroughly dry the foot, especially between the toes, after soaking. SEEK MEDICAL CARE IF:   Symptoms get worse or do not improve in 2 weeks despite treatment.  Any signs of infection develop, including redness, swelling, increased pain or tenderness, or increased warmth around the corn or callus.  New, unexplained symptoms develop (drugs used in treatment may produce side effects). Document Released: 03/09/2005 Document Revised: 06/01/2011 Document Reviewed: 06/21/2008 [facility] Patient Information 2015 [place], [place]. This information is not intended to replace advice given to you by your health care provider. Make sure you discuss any questions you have with your health care provider.

## 2013-12-11 NOTE — Progress Notes (Signed)
Subjective:     Patient ID: Tony Prince, male   DOB: [birth year], 80 y.o.   MRN: [id number]  HPI patient has a painful fifth metatarsal left it's got fluid buildup noted and makes it difficult to walk on it comfortably   Review of Systems     Objective:   Physical Exam Neurovascular status intact with muscle strength adequate and noted to have a fluid-filled area fifth metatarsal left it's painful when pressed    Assessment:     Inflamed capsule left fifth MPJ that is painful when pressed    Plan:     Injected the inflamed capsule plantar left 3 mg dexamethasone Kenalog 5 mg Xylocaine and debris did lesion fully

## 2014-01-02 ENCOUNTER — Encounter: Payer: Self-pay | Admitting: General Surgery

## 2014-01-11 ENCOUNTER — Ambulatory Visit: Payer: Medicare PPO | Admitting: Critical Care Medicine

## 2014-02-22 ENCOUNTER — Encounter: Payer: Self-pay | Admitting: Critical Care Medicine

## 2014-02-22 ENCOUNTER — Ambulatory Visit (INDEPENDENT_AMBULATORY_CARE_PROVIDER_SITE_OTHER): Payer: Medicare PPO | Admitting: Critical Care Medicine

## 2014-02-22 VITALS — BP 131/69 | HR 49 | Temp 97.6°F | Ht 71.0 in | Wt 139.1 lb

## 2014-02-22 DIAGNOSIS — J852 Abscess of lung without pneumonia: Secondary | ICD-10-CM

## 2014-02-22 DIAGNOSIS — J850 Gangrene and necrosis of lung: Secondary | ICD-10-CM

## 2014-02-22 NOTE — Patient Instructions (Signed)
We will obtain records from Dr Felipa Eth No other changes Return for recheck in one year, sooner if necessary I am willing to do an immune profile on you if this has not recently been done

## 2014-02-22 NOTE — Progress Notes (Signed)
Subjective:    Patient ID: Tony Prince, male    DOB: [birth year], 80 y.o.   MRN: [id number]  HPI 80 y.o. WM RUL mass prob CA This patient presented with an episode of cough and hemoptysis 3 weeks prior to this visit. The patient had chest x-ray which showed increased density right upper lobe and the patient is an underwent CT scan of chest which showed probable malignancy right upper lobe. The patient saw thoracic surgery and he recommended a needle biopsy which was nondiagnostic. The patient is now been recommended surgical approach and the patient comes to this office for a second opinion on the possibility of a surgical approach.   02/23/2014 Chief Complaint  Patient presents with  . Follow-up    Pt states he has had 3 sinus infections this year which has caused him to have an increase in cough. he denies any increase in SOB.   three times chronic sinus infection. ? Of  Autoimmune disorder . No recurence of infection in lungs.   Remains dyspneic with exertion.  No chest pain. Pt denies any significant sore throat, nasal congestion or excess secretions, fever, chills, sweats, unintended weight loss, pleurtic or exertional chest pain, orthopnea PND, or leg swelling Pt denies any increase in rescue therapy over baseline, denies waking up needing it or having any early am or nocturnal exacerbations of coughing/wheezing/or dyspnea. Pt also denies any obvious fluctuation in symptoms with  weather or environmental change or other alleviating or aggravating factors     Review of Systems  Constitutional: Positive for fatigue. Negative for fever, chills, diaphoresis, activity change, appetite change and unexpected weight change.  HENT: Positive for congestion, postnasal drip, rhinorrhea and sinus pressure. Negative for dental problem, ear discharge, ear pain, facial swelling, hearing loss, mouth sores, nosebleeds, sneezing, sore throat, tinnitus, trouble swallowing and voice change.   Eyes:  Negative for photophobia, discharge, itching and visual disturbance.  Respiratory: Positive for cough, chest tightness and shortness of breath. Negative for apnea, choking, wheezing and stridor.   Cardiovascular: Negative for chest pain, palpitations and leg swelling.  Gastrointestinal: Negative for nausea, vomiting, abdominal pain, constipation, blood in stool and abdominal distention.  Genitourinary: Negative for dysuria, urgency, frequency, hematuria, flank pain, decreased urine volume and difficulty urinating.  Musculoskeletal: Positive for neck pain and neck stiffness. Negative for myalgias, back pain, joint swelling, arthralgias and gait problem.  Skin: Negative for color change, pallor and rash.  Neurological: Negative for dizziness, tremors, seizures, syncope, speech difficulty, weakness, light-headedness, numbness and headaches.  Hematological: Negative for adenopathy. Does not bruise/bleed easily.  Psychiatric/Behavioral: Negative for confusion, sleep disturbance and agitation. The patient is not nervous/anxious.        Objective:   Physical Exam Filed Vitals:   02/22/14 1014  BP: 131/69  Pulse: 49  Temp: 97.6 F (36.4 C)  TempSrc: Oral  Height: 5\' 11"  (1.803 m)  Weight: 139 lb 1.9 oz (63.104 kg)  SpO2: 100%    Gen: Pleasant, well-nourished, in no distress,  normal affect  ENT: No lesions,  mouth clear,  oropharynx clear, no postnasal drip  Neck: No JVD, no TMG, no carotid bruits  Lungs: No use of accessory muscles, no dullness to percussion, distant bs   Cardiovascular: RRR, heart sounds normal, no murmur or gallops, no peripheral edema  Abdomen: soft and NT, no HSM,  BS normal  Musculoskeletal: No deformities, no cyanosis or clubbing  Neuro: alert, non focal  Skin: Warm, no lesions or rashes  No results found.  Assessment & Plan:   Pulmonary necrosis RUL s/p lobectomy History of necrotic pna in upper lobe s/p resection for same.  Hx of recurrent  sinusitis s/p treatment ? Of possible immunosuppressed state. No immune labs seen from pcp last two years' I will ask pt if he would like to have immune deficiency w/u completed     Updated Medication List Outpatient Encounter Prescriptions as of 02/22/2014  Medication Sig  . Calcium Citrate-Vitamin D 315-250 MG-UNIT TABS Take by mouth daily.  . Colloidal Oatmeal (AVEENO ECZEMA THERAPY) 1 % CREA Apply 1 application topically daily as needed (eczema).  . Eyelid Cleansers (SYSTANE LID WIPES EX) Apply 1 application topically every morning. Apply externally to both eyes.  . fish oil-omega-3 fatty acids 1000 MG capsule Take 2 g by mouth daily.   . fluocinonide cream (LIDEX) 0.05 % Apply to affected area as needed  . Hydrocortisone Butyr Lipo Base 0.1 % CREA Apply 1 application topically as needed. psoriasis  . LORazepam (ATIVAN) 0.5 MG tablet Take 0.5 mg by mouth at bedtime.   . Multiple Vitamins-Minerals (ICAPS PO) Take 2 tablets by mouth daily.   [name] Glycol-Propyl Glycol (SYSTANE ULTRA) 0.4-0.3 % SOLN Apply 1 drop to eye 2 (two) times daily as needed (dry eyes).  . polyethylene glycol (MIRALAX / GLYCOLAX) packet Take 17 g by mouth daily as needed.   . triamcinolone (KENALOG) topical spray Apply topically 2 (two) times a week. 1 application

## 2014-02-23 NOTE — Assessment & Plan Note (Signed)
History of necrotic pna in upper lobe s/p resection for same.  Hx of recurrent sinusitis s/p treatment ? Of possible immunosuppressed state. No immune labs seen from pcp last two years' I will ask pt if he would like to have immune deficiency w/u completed

## 2014-03-26 IMAGING — CR DG CHEST 2V
2 series · 2 of 2 positions shown · non-contrast
Comparison: September 27, 2012.

CLINICAL DATA: Status post thoracotomy for lung mass

CHEST - 2 VIEW

[w chest pa]
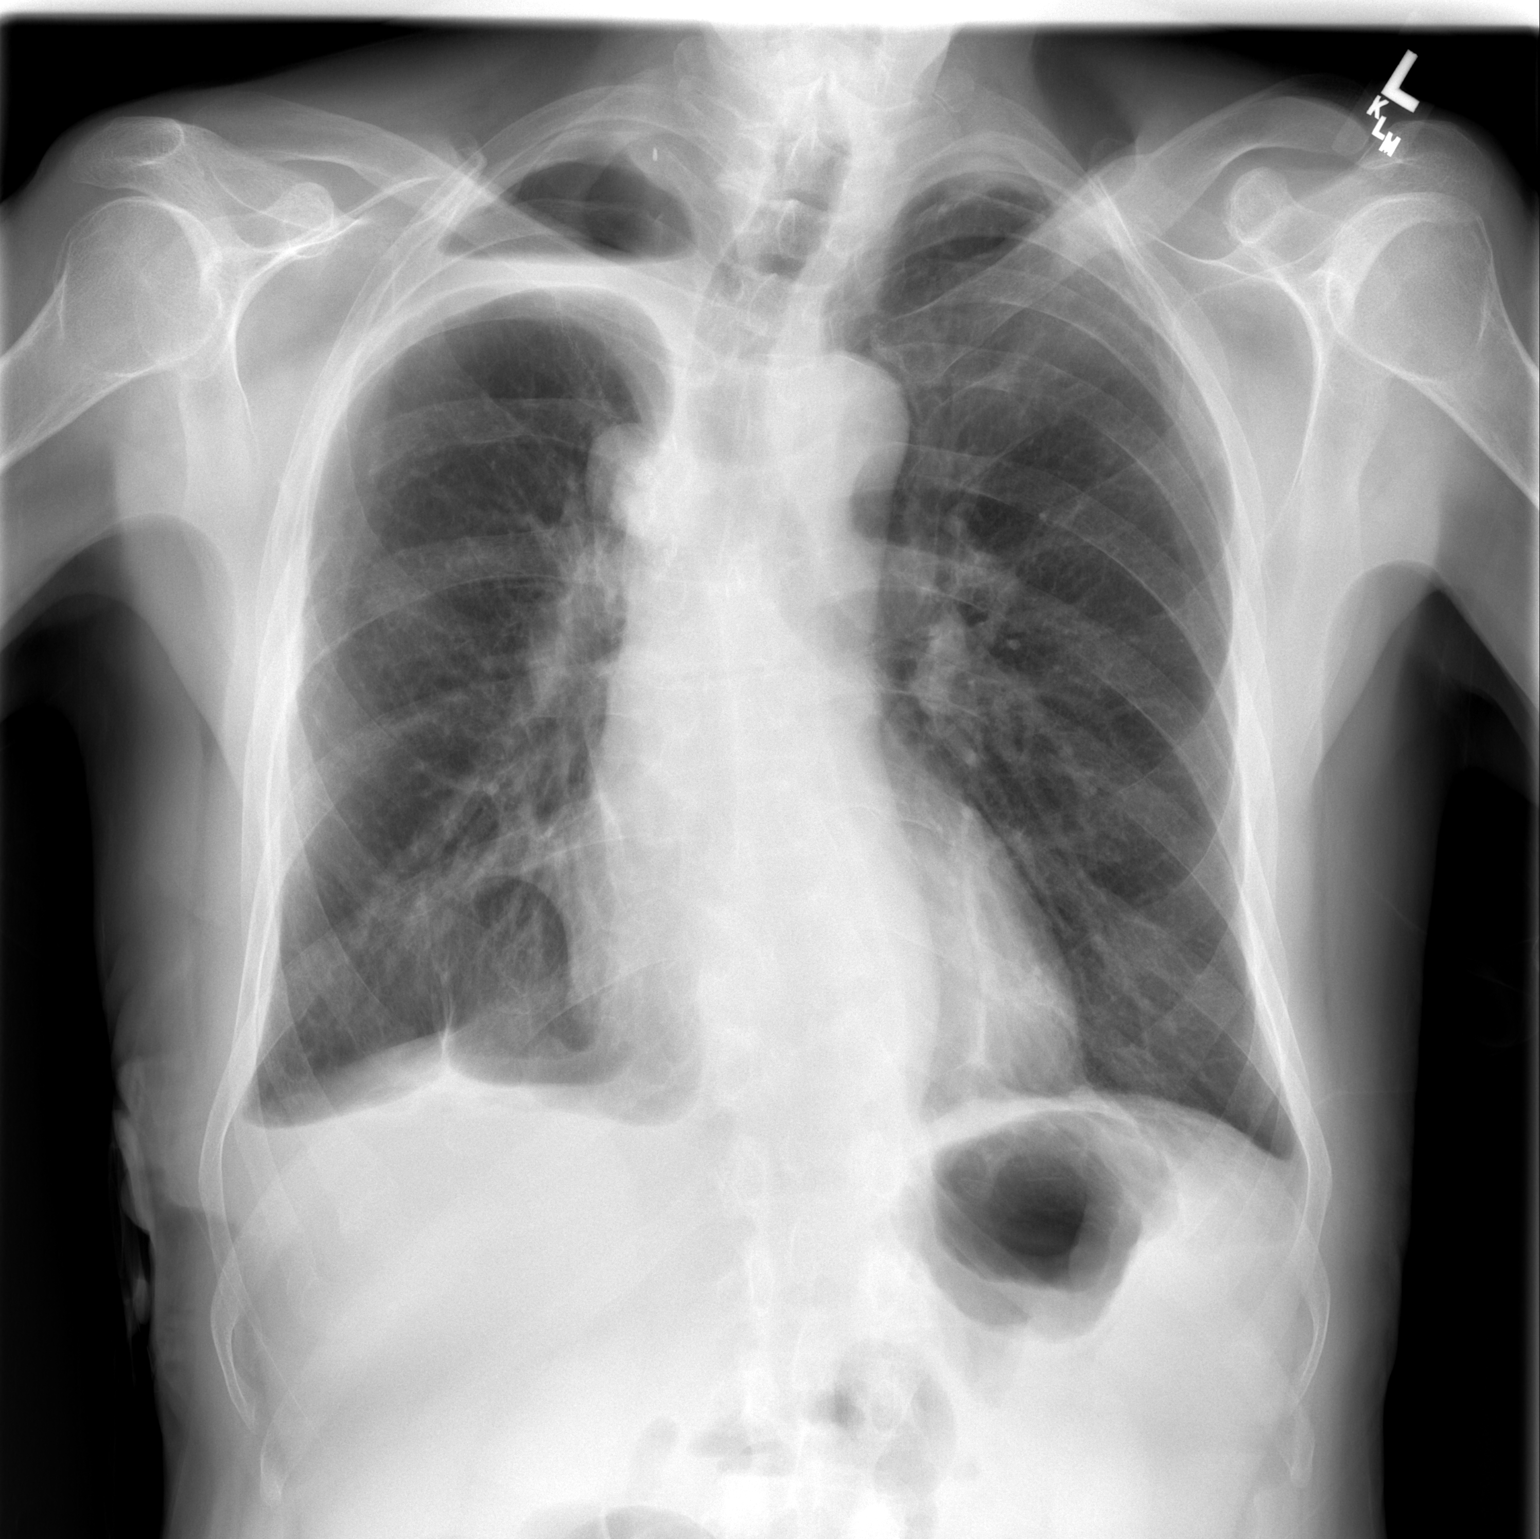

[w chest lat]
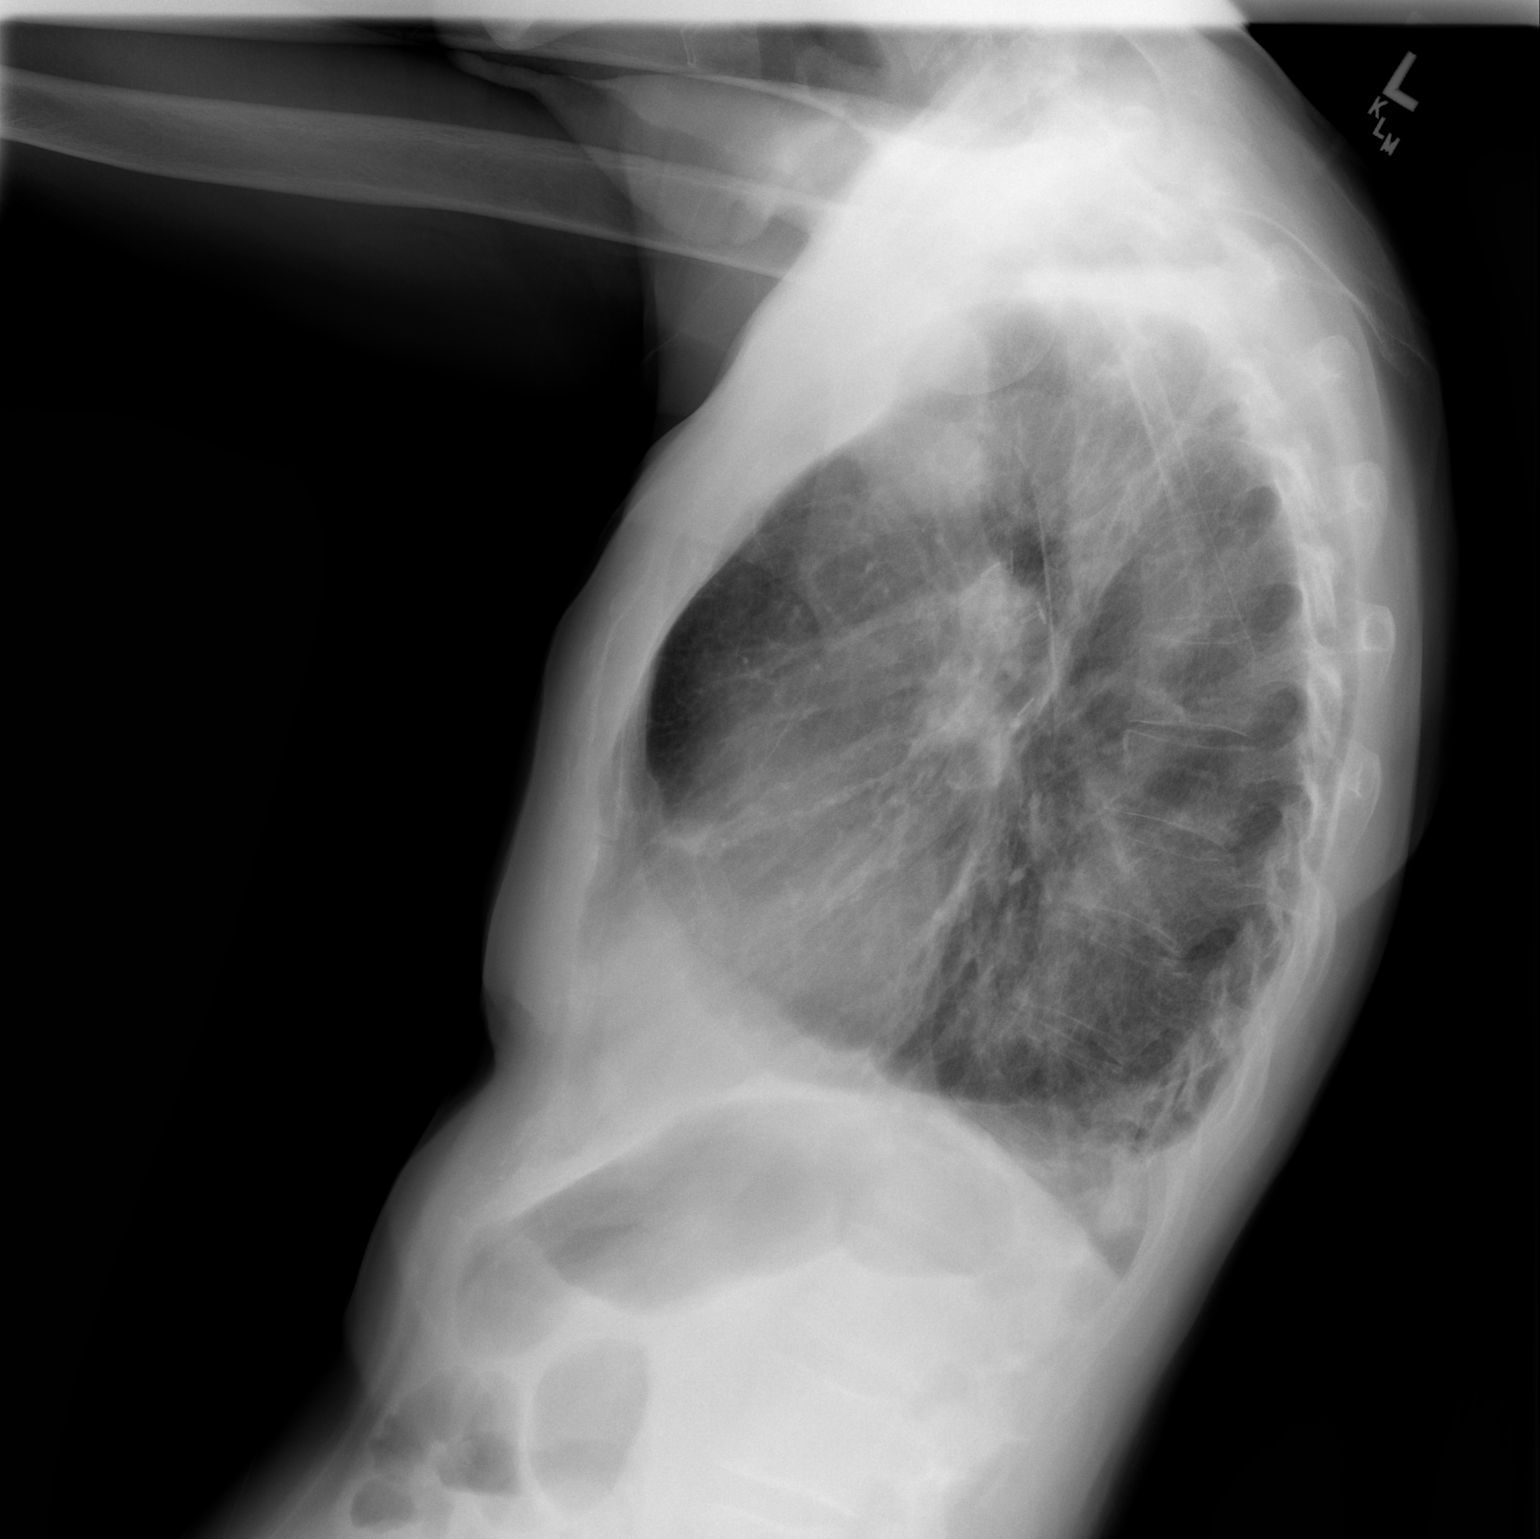

[2 of 2 positions shown; findings below may reference images not displayed]

FINDINGS: Cardiomediastinal silhouette appears normal.  Stable
loculated right upper lobe hydropneumothorax.  Mild right pleural
effusion is stable with scarring of right lower lobe.  Left lung is
clear.
IMPRESSION: Stable mild right pleural effusion.  Stable loculated right upper
lobe hydropneumothorax.

## 2014-05-09 ENCOUNTER — Ambulatory Visit (INDEPENDENT_AMBULATORY_CARE_PROVIDER_SITE_OTHER): Payer: Medicare PPO | Admitting: Podiatry

## 2014-05-09 ENCOUNTER — Encounter: Payer: Self-pay | Admitting: Podiatry

## 2014-05-09 DIAGNOSIS — M779 Enthesopathy, unspecified: Secondary | ICD-10-CM

## 2014-05-09 DIAGNOSIS — L84 Corns and callosities: Secondary | ICD-10-CM

## 2014-05-09 MED ORDER — TRIAMCINOLONE ACETONIDE 10 MG/ML IJ SUSP
10.0000 mg | Freq: Once | INTRAMUSCULAR | Status: AC
  Administered 2014-05-09: 10 mg

## 2014-05-09 NOTE — Progress Notes (Signed)
Subjective:     Patient ID: Tony Prince, male   DOB: May 13, 1932, 81 y.o.   MRN: [id number]  HPI patient states that I only got around 3 months of relief from the injection last time in my fifth metatarsal left. It's been sore with lesion formation and I feel like at times I walk on my bones of both feet   Review of Systems     Objective:   Physical Exam Neurovascular status intact muscle strength adequate with inflammation around the fifth MPJ left with keratotic lesion formation and moderate plantar flexion of the bone with inflammation    Assessment:     Plantar flexed fifth metatarsal left with plantar capsulitis and keratotic lesion formation    Plan:     Careful injection left fifth MPJ 3 mg dexamethasone Kenalog 5 mill grams Xylocaine debridement debridement of lesion accomplished and scanned for orthotics to reduce pressure against this bone. Reappoint when orthotics returned

## 2014-06-01 ENCOUNTER — Ambulatory Visit: Payer: Medicare PPO | Admitting: *Deleted

## 2014-06-01 DIAGNOSIS — M779 Enthesopathy, unspecified: Secondary | ICD-10-CM

## 2014-06-01 NOTE — Patient Instructions (Signed)

## 2014-06-01 NOTE — Progress Notes (Signed)
Patient ID: Tony Prince, male   DOB: [birth year], 81 y.o.   MRN: [id number] PICKING UP INSERTS   ORTHOTICS ARE TOO NARROW IN THE HEEL WILL SEND BACK AND HAVE THE WIDENED BY 1/4 IN

## 2014-08-07 ENCOUNTER — Ambulatory Visit
Admission: RE | Admit: 2014-08-07 | Discharge: 2014-08-07 | Disposition: A | Discharge status: Home / Self Care | Payer: Medicare PPO | Source: Ambulatory Visit | Attending: Geriatric Medicine | Admitting: Geriatric Medicine

## 2014-08-07 ENCOUNTER — Other Ambulatory Visit: Payer: Self-pay | Admitting: Geriatric Medicine

## 2014-08-07 DIAGNOSIS — M542 Cervicalgia: Secondary | ICD-10-CM

## 2014-08-23 ENCOUNTER — Encounter: Payer: Self-pay | Admitting: Physical Therapy

## 2014-08-23 ENCOUNTER — Ambulatory Visit: Payer: Medicare PPO | Attending: Geriatric Medicine | Admitting: Physical Therapy

## 2014-08-23 DIAGNOSIS — M542 Cervicalgia: Secondary | ICD-10-CM | POA: Diagnosis not present

## 2014-08-23 NOTE — Patient Instructions (Signed)
Posture - Standing   Good posture is important. Avoid slouching and forward head thrust. Maintain curve in low back and align ears over shoul- ders, hips over ankles.   Copyright  VHI. All rights reserved.  Posture - Sitting   Sit upright, head facing forward. Try using a roll to support lower back. Keep shoulders relaxed, and avoid rounded back. Keep hips level with knees. Avoid crossing legs for long periods.   Copyright  VHI. All rights reserved.  Reading   When reading, hold material in tilted position and maintain good sitting posture.   Copyright  VHI. All rights reserved.  Computer Work   Position work to Programmer, multimedia. Use proper work and seat height. Keep shoulders back and down, wrists straight, and elbows at right angles. Use chair that provides full back support. Add footrest and lumbar roll as needed.   Copyright  VHI. All rights reserved.  Need timer to be set every 30 min to reposition your posture.   [place] [address], [place], [place] [address] Phone # [phone] Fax [phone]

## 2014-08-23 NOTE — Therapy (Signed)
[facility] [address], [place], [place], [address] Phone: [phone]   Fax:  [phone]  Physical Therapy Evaluation  Patient Details  Name: Tony Prince MRN: [id number] Date of Birth: Oct 07, 1932 Referring Provider:  Lajean Manes, MD  Encounter Date: 08/23/2014      PT End of Session - 08/23/14 0852    Visit Number 1   Number of Visits 10  Medicare   Date for PT Re-Evaluation 10/04/14   PT Start Time 0845   PT Stop Time 0922   PT Time Calculation (min) 37 min   Activity Tolerance Patient tolerated treatment well   Behavior During Therapy [facility] for tasks assessed/performed      Past Medical History  Diagnosis Date  . Lung mass   . BPH (benign prostatic hyperplasia)   . Psoriasis   . Osteoporosis   . Anxiety   . Hiatal hernia   . Positional vertigo   . Prolapse of mitral valve     click  . Sebaceous cyst     left shoulder  . Allergic rhinitis   . CAD (coronary artery disease) 08/12/12    LAD calcification noted on CT  . GERD (gastroesophageal reflux disease)     currently not using antacid, hasn't for approx. one yr.   . History of stress test 08/2012    told that it was wnl  . Heart murmur     told that he has murmur by Dr. Felipa Eth   . Sinusitis     frequently  . Inguinal hernia     left side   . Exertional shortness of breath     "last 8-47yr" (09/14/2012)  . Arthritis     pt. reports that he has aches & pain, unsure if its related to degenerative process     . Pulmonary necrosis     RUL with resection ? underlying CA, neg pathology for CA  . Recurrent sinusitis   . Dry eyes   . Blurred vision     CHRONIC  . Thrombocytopenia     MILD  . MVP (mitral valve prolapse)     Past Surgical History  Procedure Laterality Date  . Appendectomy      1945  . Transurethral resection of prostate    . Tonsillectomy    . Inguinal hernia repair      "? side" (09/14/2012)  . Cataract  extraction w/ intraocular lens  implant, bilateral    . Video assisted thoracoscopy (vats)/ lobectomy Right 09/14/2012  . Video assisted thoracoscopy (vats)/wedge resection Right 09/14/2012    Procedure: VIDEO ASSISTED THORACOSCOPY (VATS)/WEDGE RESECTION;  Surgeon: SMelrose Nakayama MD;  [facility] [facility]  Service: Thoracic;  Laterality: Right;  possible chest wall resection  . Lobectomy Right 09/14/2012    Procedure: LOBECTOMY;  Surgeon: SMelrose Nakayama MD;  [place] [facility]  Service: Thoracic;  Laterality: Right;  . Thoracotomy/lobectomy Right 09/14/2012    Procedure: THORACOTOMY/LOBECTOMY;  Surgeon: SMelrose Nakayama MD;  [place] [facility]  Service: Thoracic;  Laterality: Right;    There were no vitals filed for this visit.  Visit Diagnosis:  Cervical pain - Plan: PT plan of care cert/re-cert      Subjective Assessment - 08/23/14 0852    Subjective Patient reports chronic neck pain but has been worse in the last 6 months.  Patient had lung surgery 2 years ago and caused poor posture.  Patient has difficulty breathing. Patient reports headaches from poor possture.  Pertinent History Osteoporosis   Limitations Sitting   How long can you sit comfortably? being at the computer for 6 hours; walking on treadmill cervical feels like it is giving out   Patient Stated Goals reduce pain with activities; reduce muscle soreness   Currently in Pain? 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  No   Is the patient reluctant to leave their home because of a fear of falling?  No   Prior Function   Level of Independence Independent   Observation/Other Assessments   Focus on Therapeutic Outcomes (FOTO)  43% limitation   Posture/Postural Control   Posture/Postural Control Postural limitations   Postural Limitations Forward head;Rounded Shoulders;Increased thoracic kyphosis;Flexed trunk   Posture Comments When patient  lays down his suboccipital is extended   ROM / Strength   AROM / PROM / Strength --  cervical extensors 3/5   AROM   Cervical Flexion full   Cervical Extension decreased by 75% with most movement suboccipital   Cervical - Right Side Bend decreased by 25%   Cervical - Left Side Bend decreased by 25%   Cervical - Right Rotation full   Cervical - Left Rotation full   Palpation   Palpation comment Palpable tenderness located in bil. cervical paraspinals                           PT Education - 08/23/14 0922    Education provided Yes   Education Details posture in sitting and standing   Person(s) Educated Patient   Methods Explanation;Demonstration;Tactile cues;Verbal cues;Handout   Comprehension Returned demonstration;Verbalized understanding          PT Short Term Goals - 08/23/14 0928    PT SHORT TERM GOAL #1   Title cervical pain with daily activities decreased >/= 25%   Time 3   Period Weeks   Status New   PT SHORT TERM GOAL #2   Title understand correct posture at the computer to decrease strain on cervical   Time 3   Period Weeks   Status New   PT SHORT TERM GOAL #3   Title headache decreased >/= 25%   Time 3   Period Weeks   Status New           PT Long Term Goals -  08/23/14 4132    PT LONG TERM GOAL #1   Title cervical pain with daily activities decreased >/= 75%   Time 6   Period Weeks   Status New   PT LONG TERM GOAL #2  Title headaches decreased >/= 75%   Time 6   Period Weeks   Status New   PT LONG TERM GOAL #3   Title cervical muscle tension while at the computer decreased >/= 75% due ot increased postural muscle strength   Time 6   Period Weeks   Status New   PT LONG TERM GOAL #4   Title Independent with HEP   Time 6   Period Weeks   Status New               Plan - 11-Sep-2014 8937    Clinical Impression Statement Patient is a 81 year old male with cervical pain that has become worse in the past 6 months.  Patient has been trying to finish writing a book and the increased tension in cervical while at the computer increases his neck pain and  causes headaches.  Patient has decreased cervica lextension by 75% wiht most movement in suboccipital and bil. sidebending decreaed by 25%.  Palpable tenderness located in cervical paraspinals and especially in the suboccipitals.  Patient has poor posture and increase cervcial kyphosis that conincides with osteoporosis.  FOTO score is 43% limitation.  Patinet will benenfit from physical therapy to improve his posture to decrease cervical pain, improve postural musculature strength and reduce headaches.    Pt will benefit from skilled therapeutic intervention in order to improve on the following deficits Decreased range of motion;Increased fascial restricitons;Increased muscle spasms;Decreased endurance;Decreased activity tolerance;Pain;Impaired flexibility;Hypomobility;Decreased strength;Decreased mobility   Rehab Potential Excellent   PT Frequency 2x / week   PT Duration 6 weeks   PT Treatment/Interventions Cryotherapy;Electrical Stimulation;Ultrasound;Moist Heat;Therapeutic activities;Therapeutic exercise;Neuromuscular re-education;Manual techniques;Patient/family education;Passive range of  motion;Energy conservation   PT Next Visit Plan soft tissue work to cervical; posture awareness; modalities as needed; cervical retraction in supine and sitting, doorway stretch   PT Home Exercise Plan door way stretch, cervical retraction   Recommended Other Services None   Consulted and Agree with Plan of Care Patient          G-Codes - 2014-09-11 0930    Functional Assessment Tool Used FOTO score is 43% limitataion   Functional Limitation Carrying, moving and handling objects   Carrying, Moving and Handling Objects Current Status (D4287) At least 40 percent but less than 60 percent impaired, limited or restricted   Carrying, Moving and Handling Objects Goal Status (G8115) At least 20 percent but less than 40 percent impaired, limited or restricted       Problem List Patient Active Problem List   Diagnosis Date Noted  . Dyspnea 10/11/2012  . S/P thoracotomy 09/19/2012  . S/P lobectomy of lung 09/19/2012  . Hypoxemia 09/14/2012  . Pulmonary necrosis RUL s/p lobectomy   . ANXIETY STATE, UNSPECIFIED 02/10/2007  . IRITIS 02/10/2007  . BRADYCARDIA, CHRONIC 02/10/2007  . RHINITIS, VASOMOTOR 02/10/2007  . BENZODIAZEPINE ADDICTION 11/19/2006  . OSTEOPENIA 11/19/2006    Swan Fairfax,PT Sep 11, 2014, 9:33 AM  [facility] [address], [place], [place], [address] Phone: [phone]   Fax:  [phone]

## 2014-08-30 ENCOUNTER — Encounter: Payer: Self-pay | Admitting: Physical Therapy

## 2014-08-30 ENCOUNTER — Ambulatory Visit: Payer: Medicare PPO | Admitting: Physical Therapy

## 2014-08-30 DIAGNOSIS — M542 Cervicalgia: Secondary | ICD-10-CM

## 2014-08-30 NOTE — Patient Instructions (Addendum)
  Cervico-Thoracic: Extension / Rotation (Sitting)   Reach across body with left arm and grasp back of chair. Gently look over right side shoulder. Hold __20__ seconds. Relax. Repeat ___3_ times per set. Do _1___ sets per session. Do __3__ sessions per day.  Copyright  VHI. All rights reserved.     Lumbar Rotation: Caudal - Bilateral (Supine)   Feet and knees together, arms outstretched, rotate knees left, turning head in opposite direction, until stretch is felt. Hold _20___ seconds. Relax. Repeat __3__ times per set. Do _1___ sets per session. Do _3___ sessions per day.  http://orth.exer.us/1020   Copyright  VHI. All rights reserved.  Levator Stretch   Grasp seat or sit on hand on side to be stretched. Turn head toward other side and look down. Use hand on head to gently stretch neck in that position. Hold ____ seconds. Repeat on other side. Repeat ____ times. Do ____ sessions per day.  http://gt2.exer.us/30   Copyright  VHI. All rights reserved.  Side-Bending   One hand on opposite side of head, pull head to side as far as is comfortable. Stop if there is pain. Hold ____ seconds. Repeat with other hand to other side. Repeat ____ times. Do ____ sessions per day.   Copyright  VHI. All rights reserved.  Scapular Retraction (Standing)   With arms at sides, pinch shoulder blades together. Repeat ____ times per set. Do ____ sets per session. Do ____ sessions per day.  http://orth.exer.us/944   Copyright  VHI. All rights reserved.  Chin Protraction / Retraction   Slide head forward keeping chin level. Slide head back, pulling chin in. Hold each position ___ seconds. Repeat ___ times. Do ___ sessions per day.  Copyright  VHI. All rights reserved.

## 2014-08-30 NOTE — Therapy (Signed)
[facility] [address] [place], [place], [address] Phone: [phone]   Fax:  [phone]  Physical Therapy Treatment  Patient Details  Name: Tony Prince MRN: [id number] Date of Birth: 1932/09/23 Referring Provider:  Lajean Manes, MD  Encounter Date: 08/30/2014      PT End of Session - 08/30/14 1314    Visit Number 2   Number of Visits 10   Date for PT Re-Evaluation 10/04/14   PT Start Time 1230   PT Stop Time 1313   PT Time Calculation (min) 43 min   Activity Tolerance Patient tolerated treatment well   Behavior During Therapy [facility] for tasks assessed/performed      Past Medical History  Diagnosis Date  . Lung mass   . BPH (benign prostatic hyperplasia)   . Psoriasis   . Osteoporosis   . Anxiety   . Hiatal hernia   . Positional vertigo   . Prolapse of mitral valve     click  . Sebaceous cyst     left shoulder  . Allergic rhinitis   . CAD (coronary artery disease) 08/12/12    LAD calcification noted on CT  . GERD (gastroesophageal reflux disease)     currently not using antacid, hasn't for approx. one yr.   . History of stress test 08/2012    told that it was wnl  . Heart murmur     told that he has murmur by Dr. Felipa Eth   . Sinusitis     frequently  . Inguinal hernia     left side   . Exertional shortness of breath     "last 8-64yr" (09/14/2012)  . Arthritis     pt. reports that he has aches & pain, unsure if its related to degenerative process     . Pulmonary necrosis     RUL with resection ? underlying CA, neg pathology for CA  . Recurrent sinusitis   . Dry eyes   . Blurred vision     CHRONIC  . Thrombocytopenia     MILD  . MVP (mitral valve prolapse)     Past Surgical History  Procedure Laterality Date  . Appendectomy      1945  . Transurethral resection of prostate    . Tonsillectomy    . Inguinal hernia repair      "? side" (09/14/2012)  . Cataract extraction w/  intraocular lens  implant, bilateral    . Video assisted thoracoscopy (vats)/ lobectomy Right 09/14/2012  . Video assisted thoracoscopy (vats)/wedge resection Right 09/14/2012    Procedure: VIDEO ASSISTED THORACOSCOPY (VATS)/WEDGE RESECTION;  Surgeon: SMelrose Nakayama MD;  [place]  Service: Thoracic;  Laterality: Right;  possible chest wall resection  . Lobectomy Right 09/14/2012    Procedure: LOBECTOMY;  Surgeon: SMelrose Nakayama MD;  [place]  Service: Thoracic;  Laterality: Right;  . Thoracotomy/lobectomy Right 09/14/2012    Procedure: THORACOTOMY/LOBECTOMY;  Surgeon: SMelrose Nakayama MD;  [place]  Service: Thoracic;  Laterality: Right;    There were no vitals filed for this visit.  Visit Diagnosis:  Cervical pain      Subjective Assessment - 08/30/14 1253    Subjective Pt reports chronic neck pain, what has been worse in the last 6 months. Patient had lung surgery 2 years ago what caused poor posture.    Currently in Pain? Yes   Pain Score 4    Pain Location Neck   Pain  Orientation Right;Left;Posterior   Pain Descriptors / Indicators Sore   Pain Onset More than a month ago   Multiple Pain Sites No                         [facility] Adult PT Treatment/Exercise - 08/30/14 0001    Exercises   Exercises Neck;Shoulder   Neck Exercises: Machines for Strengthening   UBE (Upper Arm Bike) L2 24mn (3/3)   Neck Exercises: Seated   Neck Retraction 15 reps;5 secs  needs tactile and verbal cues   Neck Exercises: Supine   Neck Retraction 20 reps;5 secs  needs pillow under head to achieve neutral position   Other Supine Exercise Trunk rotation with Head to opposite side to incr stretching    Other Supine Exercise Foam roll x 3 min   Shoulder Exercises: Stretch   Other Shoulder Stretches cane into overhead flexion with cane for thoracic flexibility                PT Education - 08/30/14 1313    Education Details Cervical  retraction sitting and supine -paper handout given-, trunkrotation with cervical rotation    Person(s) Educated Patient   Methods Explanation;Demonstration;Tactile cues;Handout   Comprehension Returned demonstration;Verbalized understanding          PT Short Term Goals - 08/30/14 1327    PT SHORT TERM GOAL #1   Title cervical pain with daily activities decreased >/= 25%   Time 3   Period Weeks   Status On-going   PT SHORT TERM GOAL #2   Title understand correct posture at the computer to decrease strain on cervical   Time 3   Period Weeks   Status On-going   PT SHORT TERM GOAL #3   Title headache decreased >/= 25%   Time 3   Period Weeks   Status On-going           PT Long Term Goals - 08/30/14 1328    PT LONG TERM GOAL #1   Title cervical pain with daily activities decreased >/= 75%   Time 6   Period Weeks   Status On-going   PT LONG TERM GOAL #2   Title headaches decreased >/= 75%   Time 6   Period Weeks   Status On-going   PT LONG TERM GOAL #3   Title cervical muscle tension while at the computer decreased >/= 75% due ot increased postural muscle strength   Time 6   Period Weeks   Status On-going   PT LONG TERM GOAL #4   Title Independent with HEP   Time 6   Status On-going               Plan - 08/30/14 1315    Clinical Impression Statement Pt is with limited flexibility in spine, limited ant pelvic tilt what limits to sit in neutral posture. Pt with protraction of cervical spine and incr thoracic flexion. 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 Pt has osteoporosis. Pt will continue to benefit from skilled PT to improve posture and reduce pain and increase strength postural muscles    Pt will benefit from skilled therapeutic intervention in order to improve on the following deficits Decreased range of motion;Increased fascial restricitons;Increased muscle spasms;Decreased endurance;Decreased activity tolerance;Pain;Impaired  flexibility;Hypomobility;Decreased strength;Decreased mobility   Rehab Potential Excellent   PT Frequency 2x / week   PT Duration 6 weeks   PT Treatment/Interventions Cryotherapy;Electrical Stimulation;Ultrasound;Moist  Heat;Therapeutic activities;Therapeutic exercise;Neuromuscular re-education;Manual techniques;Patient/family education;Passive range of motion;Energy conservation   PT Next Visit Plan soft tissue work to cervical; posture awareness; modalities as needed; cervical retraction in supine and sitting, doorway stretch   PT Home Exercise Plan door way stretch, cervical retraction find ex in PC   Consulted and Agree with Plan of Care Patient        Problem List Patient Active Problem List   Diagnosis Date Noted  . Dyspnea 10/11/2012  . S/P thoracotomy 09/19/2012  . S/P lobectomy of lung 09/19/2012  . Hypoxemia 09/14/2012  . Pulmonary necrosis RUL s/p lobectomy   . ANXIETY STATE, UNSPECIFIED 02/10/2007  . IRITIS 02/10/2007  . BRADYCARDIA, CHRONIC 02/10/2007  . RHINITIS, VASOMOTOR 02/10/2007  . BENZODIAZEPINE ADDICTION 11/19/2006  . OSTEOPENIA 11/19/2006    NAUMANN-HOUEGNIFIO,Syd Newsome PTA 08/30/2014, 1:41 PM  [facility] [address] [place], [place], [address] Phone: [phone]   Fax:  [phone]

## 2014-09-06 ENCOUNTER — Encounter: Payer: Self-pay | Admitting: Physical Therapy

## 2014-09-06 ENCOUNTER — Ambulatory Visit: Payer: Medicare PPO | Admitting: Physical Therapy

## 2014-09-06 DIAGNOSIS — M542 Cervicalgia: Secondary | ICD-10-CM

## 2014-09-06 NOTE — Therapy (Signed)
[facility] [address] [place], [place], [address] Phone: [phone]   Fax:  [phone]  Physical Therapy Treatment  Patient Details  Name: Tony Prince MRN: [id number] Date of Birth: 10-Mar-1933 Referring Provider:  Lajean Manes, MD  Encounter Date: 09/06/2014      PT End of Session - 09/06/14 1404    Visit Number 3   Number of Visits 10  medicare   Date for PT Re-Evaluation 10/04/14   PT Start Time 1405   PT Stop Time 1445   PT Time Calculation (min) 40 min   Activity Tolerance Patient tolerated treatment well   Behavior During Therapy [facility] for tasks assessed/performed      Past Medical History  Diagnosis Date  . Lung mass   . BPH (benign prostatic hyperplasia)   . Psoriasis   . Osteoporosis   . Anxiety   . Hiatal hernia   . Positional vertigo   . Prolapse of mitral valve     click  . Sebaceous cyst     left shoulder  . Allergic rhinitis   . CAD (coronary artery disease) 08/12/12    LAD calcification noted on CT  . GERD (gastroesophageal reflux disease)     currently not using antacid, hasn't for approx. one yr.   . History of stress test 08/2012    told that it was wnl  . Heart murmur     told that he has murmur by Dr. Felipa Eth   . Sinusitis     frequently  . Inguinal hernia     left side   . Exertional shortness of breath     "last 8-14yr" (09/14/2012)  . Arthritis     pt. reports that he has aches & pain, unsure if its related to degenerative process     . Pulmonary necrosis     RUL with resection ? underlying CA, neg pathology for CA  . Recurrent sinusitis   . Dry eyes   . Blurred vision     CHRONIC  . Thrombocytopenia     MILD  . MVP (mitral valve prolapse)     Past Surgical History  Procedure Laterality Date  . Appendectomy      1945  . Transurethral resection of prostate    . Tonsillectomy    . Inguinal hernia repair      "? side" (09/14/2012)  . Cataract  extraction w/ intraocular lens  implant, bilateral    . Video assisted thoracoscopy (vats)/ lobectomy Right 09/14/2012  . Video assisted thoracoscopy (vats)/wedge resection Right 09/14/2012    Procedure: VIDEO ASSISTED THORACOSCOPY (VATS)/WEDGE RESECTION;  Surgeon: SMelrose Nakayama MD;  [place]  Service: Thoracic;  Laterality: Right;  possible chest wall resection  . Lobectomy Right 09/14/2012    Procedure: LOBECTOMY;  Surgeon: SMelrose Nakayama MD;  [place]  Service: Thoracic;  Laterality: Right;  . Thoracotomy/lobectomy Right 09/14/2012    Procedure: THORACOTOMY/LOBECTOMY;  Surgeon: SMelrose Nakayama MD;  [place]  Service: Thoracic;  Laterality: Right;    There were no vitals filed for this visit.  Visit Diagnosis:  Cervical pain      Subjective Assessment - 09/06/14 1407    Subjective I was on the phone for 4 hours so I am in pain. When I am doing the exercise to turn my head on the mat I get dizzy. I talk on phone and use the speaker phone but I also talk on the  phone.    Pertinent History Osteoporosis   Limitations Sitting   How long can you sit comfortably? being at the computer for 6 hours; walking on treadmill cervical feels like it is giving out   Patient Stated Goals reduce pain with activities; reduce muscle soreness   Currently in Pain? Yes   Pain Score 2    Pain Location Neck   Pain Orientation Left;Right;Posterior  left worse   Pain Descriptors / Indicators Sore   Pain Type Chronic pain   Pain Onset More than a month ago   Pain Frequency Constant   Aggravating Factors  sitting at the computer, on the phone   Pain Relieving Factors rest   Multiple Pain Sites No                         [facility] Adult PT Treatment/Exercise - 09/06/14 0001    Neck Exercises: Standing   Neck Retraction 10 reps;5 secs  head press into wall,   Neck Retraction Limitations tactile cues to contract abdominals and scapula retraction   Neck  Exercises: Supine   Upper Extremity D2 Theraband;20 reps  2 sets; with head press   Other Supine Exercise horizontal abduction with yellow band 20 times x2 with head press   Shoulder Exercises: ROM/Strengthening   UBE (Upper Arm Bike) sit on green physioball going backwards 6 min level 1 wiht tactile cues to sit upright.    Manual Therapy   Manual Therapy Soft tissue mobilization   Soft tissue mobilization bil. suboccipitals and cervical paraspinals                PT Education - 09/06/14 1443    Education provided Yes   Education Details scapular strengthening with yellow band in suine   Person(s) Educated Patient   Methods Explanation;Demonstration;Tactile cues;Verbal cues;Handout   Comprehension Returned demonstration;Verbalized understanding          PT Short Term Goals - 09/06/14 1443    PT SHORT TERM GOAL #1   Title cervical pain with daily activities decreased >/= 25%   Time 3   Period Weeks   Status Achieved   PT SHORT TERM GOAL #2   Title understand correct posture at the computer to decrease strain on cervical   Time 3   Period Weeks   Status On-going  continues to need VC   PT SHORT TERM GOAL #3   Title headache decreased >/= 25%   Time 3   Period Weeks   Status Achieved           PT Long Term Goals - 08/30/14 1328    PT LONG TERM GOAL #1   Title cervical pain with daily activities decreased >/= 75%   Time 6   Period Weeks   Status On-going   PT LONG TERM GOAL #2   Title headaches decreased >/= 75%   Time 6   Period Weeks   Status On-going   PT LONG TERM GOAL #3   Title cervical muscle tension while at the computer decreased >/= 75% due ot increased postural muscle strength   Time 6   Period Weeks   Status On-going   PT LONG TERM GOAL #4   Title Independent with HEP   Time 6   Status On-going               Plan - 09/06/14 1445    Clinical Impression Statement Patient has met all of STG's. Patient needs  constant verbal cues  to work on posture with exercise. Patient came in with significant forward head.  Patient has tightness in cervical muscluature.  Patient could benefit from physical therapy  to strengthen postural muscles.    Pt will benefit from skilled therapeutic intervention in order to improve on the following deficits Decreased range of motion;Increased fascial restricitons;Increased muscle spasms;Decreased endurance;Decreased activity tolerance;Pain;Impaired flexibility;Hypomobility;Decreased strength;Decreased mobility   Rehab Potential Excellent   PT Frequency 2x / week   PT Duration 6 weeks   PT Treatment/Interventions Cryotherapy;Electrical Stimulation;Ultrasound;Moist Heat;Therapeutic activities;Therapeutic exercise;Neuromuscular re-education;Manual techniques;Patient/family education;Passive range of motion;Energy conservation   PT Next Visit Plan soft tissue work to cervical; posture awareness; modalities as needed; cervical retraction in supine and sitting, doorway stretch   PT Home Exercise Plan door way stretch, go over gym exercise at the [facility] and Agree with Plan of Care Patient        Problem List Patient Active Problem List   Diagnosis Date Noted  . Dyspnea 10/11/2012  . S/P thoracotomy 09/19/2012  . S/P lobectomy of lung 09/19/2012  . Hypoxemia 09/14/2012  . Pulmonary necrosis RUL s/p lobectomy   . ANXIETY STATE, UNSPECIFIED 02/10/2007  . IRITIS 02/10/2007  . BRADYCARDIA, CHRONIC 02/10/2007  . RHINITIS, VASOMOTOR 02/10/2007  . BENZODIAZEPINE ADDICTION 11/19/2006  . OSTEOPENIA 11/19/2006    GRAY,CHERYL,PT 09/06/2014, 2:53 PM  [facility] [address] [place], [place], [address] Phone: [phone]   Fax:  [phone]

## 2014-09-06 NOTE — Patient Instructions (Signed)
  PNF Strengthening: Resisted   Laying on back. Press head into pillow. Standing with resistive band around each hand, bring right arm up and away, thumb back. Repeat _10___ times per set. Do _1___ sets per session. Do _1-2___ sessions per day.     Resisted Horizontal Abduction: Bilateral   Laying on back with head pressed into pillow with tubing in both hands, arms out in front. Keeping arms straight, pinch shoulder blades together and stretch arms out. Repeat _10___ times per set. Do 2____ sets per session. Do _1-2___ sessions per day. [facility] [address], [place], [place] [address] Phone # [phone] Fax [phone]

## 2014-09-11 ENCOUNTER — Ambulatory Visit: Payer: Medicare PPO | Admitting: Physical Therapy

## 2014-09-11 ENCOUNTER — Encounter: Payer: Self-pay | Admitting: Physical Therapy

## 2014-09-11 DIAGNOSIS — M542 Cervicalgia: Secondary | ICD-10-CM | POA: Diagnosis not present

## 2014-09-11 NOTE — Therapy (Signed)
[facility] [address] [place], [place], [address] Phone: [phone]   Fax:  [phone]  Physical Therapy Treatment  Patient Details  Name: Tony Prince MRN: [id number] Date of Birth: Nov 26, 1932 Referring Provider:  Lajean Manes, MD  Encounter Date: 09/11/2014      PT End of Session - 09/11/14 1257    Visit Number 4   Number of Visits 10   Date for PT Re-Evaluation 10/04/14   PT Start Time 1230   PT Stop Time 1314   PT Time Calculation (min) 44 min   Activity Tolerance Patient tolerated treatment well   Behavior During Therapy [facility] for tasks assessed/performed      Past Medical History  Diagnosis Date  . Lung mass   . BPH (benign prostatic hyperplasia)   . Psoriasis   . Osteoporosis   . Anxiety   . Hiatal hernia   . Positional vertigo   . Prolapse of mitral valve     click  . Sebaceous cyst     left shoulder  . Allergic rhinitis   . CAD (coronary artery disease) 08/12/12    LAD calcification noted on CT  . GERD (gastroesophageal reflux disease)     currently not using antacid, hasn't for approx. one yr.   . History of stress test 08/2012    told that it was wnl  . Heart murmur     told that he has murmur by Dr. Felipa Eth   . Sinusitis     frequently  . Inguinal hernia     left side   . Exertional shortness of breath     "last 8-64yr" (09/14/2012)  . Arthritis     pt. reports that he has aches & pain, unsure if its related to degenerative process     . Pulmonary necrosis     RUL with resection ? underlying CA, neg pathology for CA  . Recurrent sinusitis   . Dry eyes   . Blurred vision     CHRONIC  . Thrombocytopenia     MILD  . MVP (mitral valve prolapse)     Past Surgical History  Procedure Laterality Date  . Appendectomy      1945  . Transurethral resection of prostate    . Tonsillectomy    . Inguinal hernia repair      "? side" (09/14/2012)  . Cataract extraction w/  intraocular lens  implant, bilateral    . Video assisted thoracoscopy (vats)/ lobectomy Right 09/14/2012  . Video assisted thoracoscopy (vats)/wedge resection Right 09/14/2012    Procedure: VIDEO ASSISTED THORACOSCOPY (VATS)/WEDGE RESECTION;  Surgeon: SMelrose Nakayama MD;  [place]  Service: Thoracic;  Laterality: Right;  possible chest wall resection  . Lobectomy Right 09/14/2012    Procedure: LOBECTOMY;  Surgeon: SMelrose Nakayama MD;  [place]  Service: Thoracic;  Laterality: Right;  . Thoracotomy/lobectomy Right 09/14/2012    Procedure: THORACOTOMY/LOBECTOMY;  Surgeon: SMelrose Nakayama MD;  [place]  Service: Thoracic;  Laterality: Right;    There were no vitals filed for this visit.  Visit Diagnosis:  Cervical pain      Subjective Assessment - 09/11/14 1238    Subjective Pain in neck, limited with housework, decreased tolerance for sitting at the computer for his worki   Pertinent History Osteoporosis   Limitations Sitting   Currently in Pain? Yes   Pain Score 2    Pain Location Neck   Pain Orientation  Right;Left;Posterior   Pain Descriptors / Indicators Sore   Pain Type Chronic pain   Pain Onset More than a month ago   Pain Frequency Constant   Multiple Pain Sites No                         [facility] Adult PT Treatment/Exercise - 09/11/14 0001    Exercises   Exercises Neck;Shoulder   Neck Exercises: Machines for Strengthening   UBE (Upper Arm Bike) L2 57mn (3/3)   Neck Exercises: Standing   Neck Retraction --   Neck Retraction Limitations --   Neck Exercises: Seated   Neck Retraction 20 reps  sitting with noddle behind back   Other Seated Exercise bil abduction yellow t-band with cervical retraction against noodle 2 x 10   Neck Exercises: Supine   Neck Retraction 20 reps;5 secs  need folded towel to prevent hyperextension cervical spine   Upper Extremity D2 --  with head press, sitting against noodle    Other Supine  Exercise horizontal abduction with yellow band 20 times x2 with head press  with neck retraction into noodle    Other Supine Exercise Foam roll x 3 min   Shoulder Exercises: ROM/Strengthening   UBE (Upper Arm Bike) going backwards 7 min level 1 with tactile cues to sit upright.    Manual Therapy   Manual Therapy Soft tissue mobilization   Soft tissue mobilization bil. suboccipitals and cervical paraspinals                  PT Short Term Goals - 09/06/14 1443    PT SHORT TERM GOAL #1   Title cervical pain with daily activities decreased >/= 25%   Time 3   Period Weeks   Status Achieved   PT SHORT TERM GOAL #2   Title understand correct posture at the computer to decrease strain on cervical   Time 3   Period Weeks   Status On-going  continues to need VC   PT SHORT TERM GOAL #3   Title headache decreased >/= 25%   Time 3   Period Weeks   Status Achieved           PT Long Term Goals - 09/11/14 1318    PT LONG TERM GOAL #1   Title cervical pain with daily activities decreased >/= 75%   Time 6   Period Weeks   Status On-going   PT LONG TERM GOAL #2   Title headaches decreased >/= 75%   Time 6   Period Weeks   Status On-going   PT LONG TERM GOAL #3   Title cervical muscle tension while at the computer decreased >/= 75% due ot increased postural muscle strength   Time 6   Period Weeks   Status On-going   PT LONG TERM GOAL #4   Title Independent with HEP   Time 6   Period Weeks   Status On-going               Plan - 09/11/14 1259    Clinical Impression Statement Pt with poor posture and needs to work on strengthening to be able to maintain porper trunk and head position. Pt will continue to benefit from PT to adress forward head position and strengthening postural     Pt will benefit from skilled therapeutic intervention in order to improve on the following deficits Decreased range of motion;Increased fascial restricitons;Increased muscle  spasms;Decreased endurance;Decreased activity tolerance;Pain;Impaired flexibility;Hypomobility;Decreased  strength;Decreased mobility   Rehab Potential Excellent   PT Frequency 2x / week   PT Duration 6 weeks   PT Treatment/Interventions Cryotherapy;Electrical Stimulation;Ultrasound;Moist Heat;Therapeutic activities;Therapeutic exercise;Neuromuscular re-education;Manual techniques;Patient/family education;Passive range of motion;Energy conservation   PT Next Visit Plan soft tissue work to cervical; posture awareness; modalities as needed; cervical retraction in supine and sitting, doorway stretch   PT Home Exercise Plan door way stretch, go over gym exercise at the [facility] and Agree with Plan of Care Patient        Problem List Patient Active Problem List   Diagnosis Date Noted  . Dyspnea 10/11/2012  . S/P thoracotomy 09/19/2012  . S/P lobectomy of lung 09/19/2012  . Hypoxemia 09/14/2012  . Pulmonary necrosis RUL s/p lobectomy   . ANXIETY STATE, UNSPECIFIED 02/10/2007  . IRITIS 02/10/2007  . BRADYCARDIA, CHRONIC 02/10/2007  . RHINITIS, VASOMOTOR 02/10/2007  . BENZODIAZEPINE ADDICTION 11/19/2006  . OSTEOPENIA 11/19/2006    NAUMANN-HOUEGNIFIO,Analeise Mccleery PTA 09/11/2014, 1:24 PM  [facility] [address] [place], [place], [address] Phone: [phone]   Fax:  [phone]

## 2014-09-13 ENCOUNTER — Encounter: Payer: Self-pay | Admitting: Physical Therapy

## 2014-09-18 ENCOUNTER — Encounter: Payer: Self-pay | Admitting: Physical Therapy

## 2014-09-18 ENCOUNTER — Ambulatory Visit: Payer: Medicare PPO | Admitting: Physical Therapy

## 2014-09-18 DIAGNOSIS — M542 Cervicalgia: Secondary | ICD-10-CM | POA: Diagnosis not present

## 2014-09-18 NOTE — Therapy (Signed)
[facility] [address] [place], [place], [address] Phone: [phone]   Fax:  [phone]  Physical Therapy Treatment  Patient Details  Name: Tony Prince MRN: [id number] Date of Birth: 1933/01/26 Referring Provider:  Lajean Manes, MD  Encounter Date: 09/18/2014      PT End of Session - 09/18/14 1244    Visit Number 4   Number of Visits 10  Medicare   Date for PT Re-Evaluation 10/04/14   PT Start Time 1230   PT Stop Time 1310   PT Time Calculation (min) 40 min   Activity Tolerance Patient tolerated treatment well   Behavior During Therapy [facility] for tasks assessed/performed      Past Medical History  Diagnosis Date  . Lung mass   . BPH (benign prostatic hyperplasia)   . Psoriasis   . Osteoporosis   . Anxiety   . Hiatal hernia   . Positional vertigo   . Prolapse of mitral valve     click  . Sebaceous cyst     left shoulder  . Allergic rhinitis   . CAD (coronary artery disease) 08/12/12    LAD calcification noted on CT  . GERD (gastroesophageal reflux disease)     currently not using antacid, hasn't for approx. one yr.   . History of stress test 08/2012    told that it was wnl  . Heart murmur     told that he has murmur by Dr. Felipa Eth   . Sinusitis     frequently  . Inguinal hernia     left side   . Exertional shortness of breath     "last 8-4yr" (09/14/2012)  . Arthritis     pt. reports that he has aches & pain, unsure if its related to degenerative process     . Pulmonary necrosis     RUL with resection ? underlying CA, neg pathology for CA  . Recurrent sinusitis   . Dry eyes   . Blurred vision     CHRONIC  . Thrombocytopenia     MILD  . MVP (mitral valve prolapse)     Past Surgical History  Procedure Laterality Date  . Appendectomy      1945  . Transurethral resection of prostate    . Tonsillectomy    . Inguinal hernia repair      "? side" (09/14/2012)  . Cataract  extraction w/ intraocular lens  implant, bilateral    . Video assisted thoracoscopy (vats)/ lobectomy Right 09/14/2012  . Video assisted thoracoscopy (vats)/wedge resection Right 09/14/2012    Procedure: VIDEO ASSISTED THORACOSCOPY (VATS)/WEDGE RESECTION;  Surgeon: SMelrose Nakayama MD;  [place]  Service: Thoracic;  Laterality: Right;  possible chest wall resection  . Lobectomy Right 09/14/2012    Procedure: LOBECTOMY;  Surgeon: SMelrose Nakayama MD;  [place] [facility]  Service: Thoracic;  Laterality: Right;  . Thoracotomy/lobectomy Right 09/14/2012    Procedure: THORACOTOMY/LOBECTOMY;  Surgeon: SMelrose Nakayama MD;  [place] [facility]  Service: Thoracic;  Laterality: Right;    There were no vitals filed for this visit.  Visit Diagnosis:  Cervical pain      Subjective Assessment - 09/18/14 1241    Subjective I am putting my book in today so I can take a little break. I [facility]. When I move my head side to side it makes me feel dizzy.    Pertinent History  Osteoporosis   Limitations Sitting   How long can you sit comfortably? being at the computer for 6 hours; walking on treadmill cervical feels like it is giving out   Patient Stated Goals reduce pain with activities; reduce muscle soreness   Currently in Pain? Yes   Pain Score 4    Pain Location Neck   Pain Orientation Right;Left;Posterior   Pain Descriptors / Indicators Sore   Pain Type Chronic pain   Pain Onset More than a month ago   Pain Frequency Constant   Aggravating Factors  sitting at the computer, on the phone   Pain Relieving Factors rest   Multiple Pain Sites No            [facility] PT Assessment - 09/18/14 0001    AROM   Cervical Extension decreased by 50%   Cervical - Right Side Bend decreased by 50%   Cervical - Left Side Bend decreased by 50%                     [facility] Adult PT Treatment/Exercise - 09/18/14 0001    Neck Exercises: Machines  for Strengthening   UBE (Upper Arm Bike) L2 20mn (3/3)   Neck Exercises: Supine   Neck Retraction 20 reps;5 secs  need folded towel to prevent hyperextension cervical spine   Upper Extremity D2 Theraband;20 reps  2 sets; with head press   Theraband Level (UE D2) Level 3 (Green)   Other Supine Exercise horizontal abduction with green band 20 times x2 with head press  with neck retraction into noodle    Manual Therapy   Manual Therapy Soft tissue mobilization;Passive ROM   Soft tissue mobilization bil. suboccipitals and cervical paraspinals   Passive ROM bil. sidebending                PT Education - 09/18/14 1312    Education provided Yes   Education Details gave patient green theraband to progress his HEP   Person(s) Educated Patient   Methods Explanation;Demonstration   Comprehension Verbalized understanding;Returned demonstration          PT Short Term Goals - 09/18/14 1243    PT SHORT TERM GOAL #2   Title understand correct posture at the computer to decrease strain on cervical   Time 3   Period Weeks   Status Achieved   PT SHORT TERM GOAL #3   Title headache decreased >/= 25%   Time 3   Period Weeks   Status Achieved           PT Long Term Goals - 09/18/14 1243    PT LONG TERM GOAL #1   Title cervical pain with daily activities decreased >/= 75%   Time 6   Period Weeks   Status On-going  no significant change due to no exercise   PT LONG TERM GOAL #2   Title headaches decreased >/= 75%   Time 6   Period Weeks   Status Achieved   PT LONG TERM GOAL #3   Title cervical muscle tension while at the computer decreased >/= 75% due ot increased postural muscle strength   Time 6   Period Weeks   Status On-going  working on strength   PT LONG TERM GOAL #4   Title Independent with HEP   Time 6   Period Weeks   Status On-going  still learning HEP               Plan -  09/18/14 1312    Clinical Impression Statement Patient has not met  goals due to increased tension from being on the phone for 4 hours to finish his book and hand in this afternoon. After today patient will be able to focus on his HEP for his neck du eot not having the stress of handing in his book. Patient has improved cervical sidebending and extension.  Patient is able to  hold his head up better and reduction in forward head after therapy. Patient would benefit from continued therpay to progress cervical strength.    Pt will benefit from skilled therapeutic intervention in order to improve on the following deficits Decreased range of motion;Increased fascial restricitons;Increased muscle spasms;Decreased endurance;Decreased activity tolerance;Pain;Impaired flexibility;Hypomobility;Decreased strength;Decreased mobility   Rehab Potential Excellent   PT Frequency 2x / week   PT Duration 6 weeks   PT Treatment/Interventions Cryotherapy;Electrical Stimulation;Ultrasound;Moist Heat;Therapeutic activities;Therapeutic exercise;Neuromuscular re-education;Manual techniques;Patient/family education;Passive range of motion;Energy conservation   PT Next Visit Plan soft tissue work to cervical; posture awareness; modalities as needed; cervical retraction in supine and sitting, doorway stretch   PT Home Exercise Plan current HEP   Consulted and Agree with Plan of Care Patient        Problem List Patient Active Problem List   Diagnosis Date Noted  . Dyspnea 10/11/2012  . S/P thoracotomy 09/19/2012  . S/P lobectomy of lung 09/19/2012  . Hypoxemia 09/14/2012  . Pulmonary necrosis RUL s/p lobectomy   . ANXIETY STATE, UNSPECIFIED 02/10/2007  . IRITIS 02/10/2007  . BRADYCARDIA, CHRONIC 02/10/2007  . RHINITIS, VASOMOTOR 02/10/2007  . BENZODIAZEPINE ADDICTION 11/19/2006  . OSTEOPENIA 11/19/2006    Myiah Petkus,PT 09/18/2014, 1:17 PM  [facility] [address], [place], [place], [address] Phone: [phone]    Fax:  [phone]

## 2014-09-20 ENCOUNTER — Encounter: Payer: Self-pay | Admitting: Physical Therapy

## 2014-09-26 ENCOUNTER — Ambulatory Visit: Payer: Medicare PPO | Admitting: Physical Therapy

## 2014-10-03 ENCOUNTER — Ambulatory Visit: Payer: Medicare PPO | Attending: Geriatric Medicine | Admitting: Physical Therapy

## 2014-10-03 ENCOUNTER — Encounter: Payer: Self-pay | Admitting: Physical Therapy

## 2014-10-03 DIAGNOSIS — M542 Cervicalgia: Secondary | ICD-10-CM | POA: Insufficient documentation

## 2014-10-03 NOTE — Therapy (Signed)
[facility] [address] [place], [place], [address] Phone: [phone]   Fax:  [phone]  Physical Therapy Treatment  Patient Details  Name: Tony Prince MRN: [id number] Date of Birth: 04-22-1932 Referring Provider:  Lajean Manes, MD  Encounter Date: 10/03/2014      PT End of Session - 10/03/14 1236    Visit Number 5   Number of Visits 10  Medicare   Date for PT Re-Evaluation 11/01/14   PT Start Time 1230   PT Stop Time 1310   PT Time Calculation (min) 40 min   Activity Tolerance Patient tolerated treatment well   Behavior During Therapy [facility] for tasks assessed/performed      Past Medical History  Diagnosis Date  . Lung mass   . BPH (benign prostatic hyperplasia)   . Psoriasis   . Osteoporosis   . Anxiety   . Hiatal hernia   . Positional vertigo   . Prolapse of mitral valve     click  . Sebaceous cyst     left shoulder  . Allergic rhinitis   . CAD (coronary artery disease) 08/12/12    LAD calcification noted on CT  . GERD (gastroesophageal reflux disease)     currently not using antacid, hasn't for approx. one yr.   . History of stress test 08/2012    told that it was wnl  . Heart murmur     told that he has murmur by Dr. Felipa Eth   . Sinusitis     frequently  . Inguinal hernia     left side   . Exertional shortness of breath     "last 8-56yrs" (09/14/2012)  . Arthritis     pt. reports that he has aches & pain, unsure if its related to degenerative process     . Pulmonary necrosis     RUL with resection ? underlying CA, neg pathology for CA  . Recurrent sinusitis   . Dry eyes   . Blurred vision     CHRONIC  . Thrombocytopenia     MILD  . MVP (mitral valve prolapse)     Past Surgical History  Procedure Laterality Date  . Appendectomy      1945  . Transurethral resection of prostate    . Tonsillectomy    . Inguinal hernia repair      "? side" (09/14/2012)  . Cataract  extraction w/ intraocular lens  implant, bilateral    . Video assisted thoracoscopy (vats)/ lobectomy Right 09/14/2012  . Video assisted thoracoscopy (vats)/wedge resection Right 09/14/2012    Procedure: VIDEO ASSISTED THORACOSCOPY (VATS)/WEDGE RESECTION;  Surgeon: Melrose Nakayama, MD;  Location: [place];  Service: Thoracic;  Laterality: Right;  possible chest wall resection  . Lobectomy Right 09/14/2012    Procedure: LOBECTOMY;  Surgeon: Melrose Nakayama, MD;  Location: [place];  Service: Thoracic;  Laterality: Right;  . Thoracotomy/lobectomy Right 09/14/2012    Procedure: THORACOTOMY/LOBECTOMY;  Surgeon: Melrose Nakayama, MD;  Location: [place];  Service: Thoracic;  Laterality: Right;    There were no vitals filed for this visit.  Visit Diagnosis:  Cervical pain - Plan: PT plan of care cert/re-cert      Subjective Assessment - 10/03/14 1250    Subjective I feel good after the massage in therapy but 4 hours later I feel stiffer.  I have been more aware of my posture in the past few days due to not working on my book. I  have more difficulty putting my head back to have my hair washed.    Pertinent History Osteoporosis   Limitations Sitting   How long can you sit comfortably? being at the computer for 6 hours; walking on treadmill cervical feels like it is giving out   Patient Stated Goals reduce pain with activities; reduce muscle soreness   Currently in Pain? Yes   Pain Score 2    Pain Location Neck   Pain Orientation Right;Left   Pain Descriptors / Indicators Tightness   Pain Type Chronic pain   Pain Onset More than a month ago   Pain Frequency Constant   Aggravating Factors  sitting at the computer, on the phone   Pain Relieving Factors rest   Multiple Pain Sites No            [facility] PT Assessment - 10/03/14 0001    Assessment   Medical Diagnosis M54.2 Cervicalgia   Onset Date/Surgical Date 03/23/14   Precautions   Precautions Cervical   Precaution Comments No joint  mobilization due to osteroporosis   Prior Function   Level of Independence Independent   Observation/Other Assessments   Focus on Therapeutic Outcomes (FOTO)  42% limitation   AROM   Cervical Extension full   Cervical - Right Side Bend decreased 25%   Cervical - Left Side Bend decreased by 25%   Cervical - Right Rotation full   Cervical - Left Rotation full                     [facility] Adult PT Treatment/Exercise - 10/03/14 0001    Neck Exercises: Machines for Strengthening   UBE (Upper Arm Bike) L2 59mn (3/3)   Neck Exercises: Standing   Other Standing Exercises alternate shoulder flexion wiht head press into ball 20x, head press wiht Y motion 20 times;    Other Standing Exercises bilateral shoulder flexion facing wall with tactile cues on head retraction 20x   Neck Exercises: Sidelying   Lateral Flexion Left;Right;10 reps   Neck Exercises: Prone   Neck Retraction 5 secs;10 reps                PT Education - 10/03/14 1315    Education provided No          PT Short Term Goals - 09/18/14 1243    PT SHORT TERM GOAL #2   Title understand correct posture at the computer to decrease strain on cervical   Time 3   Period Weeks   Status Achieved   PT SHORT TERM GOAL #3   Title headache decreased >/= 25%   Time 3   Period Weeks   Status Achieved           PT Long Term Goals - 10/03/14 1244    PT LONG TERM GOAL #1   Title cervical pain with daily activities decreased >/= 75%   Time 6   Period Weeks   Status On-going  depends if I am on the computer    PT LONG TERM GOAL #3   Title cervical muscle tension while at the computer decreased >/= 75% due ot increased postural muscle strength   Time 6   Period Weeks   Status On-going  depends on how focused he is on the computer   PT LONG TERM GOAL #4   Title Independent with HEP   Time 6   Period Weeks   Status Achieved  Plan - 10-04-14 1315    Clinical Impression Statement  Patient is a 81 year old male with cervical pain. Patient forward head has improved by 50% since he has stopped working on the computer to finish his book.  Patient continues to have stiffness in cervical during the morning. Patient has not  met cervical muscle tension and cervcial pain goal due to it dependening on how long he is on the computer. Patient cervical sidebending is limited by 25% but all other motions are full.  Patient would benefit from therapy to work on cervical musculature strength  and  decreasing hid tightness.    Pt will benefit from skilled therapeutic intervention in order to improve on the following deficits Decreased range of motion;Increased fascial restricitons;Increased muscle spasms;Decreased endurance;Decreased activity tolerance;Pain;Impaired flexibility;Hypomobility;Decreased strength;Decreased mobility   Rehab Potential Excellent   Clinical Impairments Affecting Rehab Potential None   PT Frequency 2x / week   PT Duration 4 weeks   PT Treatment/Interventions Cryotherapy;Electrical Stimulation;Ultrasound;Moist Heat;Therapeutic activities;Therapeutic exercise;Neuromuscular re-education;Manual techniques;Patient/family education;Passive range of motion;Energy conservation   PT Next Visit Plan cervical strengthening   PT Home Exercise Plan progress as needed   Consulted and Agree with Plan of Care Patient          G-Codes - 10-04-14 1303    Functional Assessment Tool Used --   Functional Limitation --      Problem List Patient Active Problem List   Diagnosis Date Noted  . Dyspnea 10/11/2012  . S/P thoracotomy 09/19/2012  . S/P lobectomy of lung 09/19/2012  . Hypoxemia 09/14/2012  . Pulmonary necrosis RUL s/p lobectomy   . ANXIETY STATE, UNSPECIFIED 02/10/2007  . IRITIS 02/10/2007  . BRADYCARDIA, CHRONIC 02/10/2007  . RHINITIS, VASOMOTOR 02/10/2007  . BENZODIAZEPINE ADDICTION 11/19/2006  . OSTEOPENIA 11/19/2006    Aking Klabunde,PT Oct 04, 2014, 1:23  PM  [facility] [address] [place], [place], [address] Phone: [phone]   Fax:  [phone]

## 2014-10-09 ENCOUNTER — Ambulatory Visit: Payer: Medicare PPO

## 2014-10-10 ENCOUNTER — Ambulatory Visit: Payer: Medicare PPO

## 2014-10-10 DIAGNOSIS — M542 Cervicalgia: Secondary | ICD-10-CM | POA: Diagnosis not present

## 2014-10-10 NOTE — Therapy (Signed)
[facility] [address], [place], [place], [address] Phone: [phone]   Fax:  [phone]  Physical Therapy Treatment  Patient Details  Name: Tony Prince MRN: [id number] Date of Birth: [birth year] Referring Provider:  Lajean Manes, MD  Encounter Date: 10/10/2014      PT End of Session - 10/10/14 1526    Visit Number 6   Number of Visits 10  Medicare   Date for PT Re-Evaluation 11/01/14   Authorization Type [employer]   Authorization Time Period 10/08/14-11/22/14   Authorization - Visit Number 1   Authorization - Number of Visits 3   PT Start Time 1224   PT Stop Time 1526   PT Time Calculation (min) 40 min   Activity Tolerance Patient tolerated treatment well   Behavior During Therapy [facility] for tasks assessed/performed      Past Medical History  Diagnosis Date  . Lung mass   . BPH (benign prostatic hyperplasia)   . Psoriasis   . Osteoporosis   . Anxiety   . Hiatal hernia   . Positional vertigo   . Prolapse of mitral valve     click  . Sebaceous cyst     left shoulder  . Allergic rhinitis   . CAD (coronary artery disease) 08/12/12    LAD calcification noted on CT  . GERD (gastroesophageal reflux disease)     currently not using antacid, hasn't for approx. one yr.   . History of stress test 08/2012    told that it was wnl  . Heart murmur     told that he has murmur by Dr. Felipa Eth   . Sinusitis     frequently  . Inguinal hernia     left side   . Exertional shortness of breath     "last 8-77yr" (09/14/2012)  . Arthritis     pt. reports that he has aches & pain, unsure if its related to degenerative process     . Pulmonary necrosis     RUL with resection ? underlying CA, neg pathology for CA  . Recurrent sinusitis   . Dry eyes   . Blurred vision     CHRONIC  . Thrombocytopenia     MILD  . MVP (mitral valve prolapse)     Past Surgical History  Procedure Laterality Date  . Appendectomy       1945  . Transurethral resection of prostate    . Tonsillectomy    . Inguinal hernia repair      "? side" (09/14/2012)  . Cataract extraction w/ intraocular lens  implant, bilateral    . Video assisted thoracoscopy (vats)/ lobectomy Right 09/14/2012  . Video assisted thoracoscopy (vats)/wedge resection Right 09/14/2012    Procedure: VIDEO ASSISTED THORACOSCOPY (VATS)/WEDGE RESECTION;  Surgeon: SMelrose Nakayama MD;  [place]  Service: Thoracic;  Laterality: Right;  possible chest wall resection  . Lobectomy Right 09/14/2012    Procedure: LOBECTOMY;  Surgeon: SMelrose Nakayama MD;  Location[place]  Service: Thoracic;  Laterality: Right;  . Thoracotomy/lobectomy Right 09/14/2012    Procedure: THORACOTOMY/LOBECTOMY;  Surgeon: SMelrose Nakayama MD;  Location[place] [facility]  Service: Thoracic;  Laterality: Right;    There were no vitals filed for this visit.  Visit Diagnosis:  Cervical pain      Subjective Assessment - 10/10/14 1448    Subjective Pt was working at the computer x 4 hours prior to coming to PT today.  My neck  feels stiff.     Currently in Pain? Yes   Pain Score 2    Pain Location Neck   Pain Orientation Right;Left   Pain Descriptors / Indicators Tightness   Pain Type Chronic pain   Pain Onset More than a month ago   Pain Frequency Intermittent   Aggravating Factors  computer, phone, early morning with waking   Pain Relieving Factors stretching, rest                         [facility] Adult PT Treatment/Exercise - 10/10/14 0001    Neck Exercises: Machines for Strengthening   UBE (Upper Arm Bike) L2 31mn (3/3)  PT present to discuss progress.     Neck Exercises: Supine   Upper Extremity D2 Theraband;20 reps  2 sets; with head press   Theraband Level (UE D2) Level 4 (Blue)   Other Supine Exercise horizontal abduction with blue band 20 times x2 with head press  supine on foam roll   Other Supine Exercise Foam roll x 3 min   Shoulder  Exercises: Standing   Extension Strengthening;Both;20 reps;Theraband   Theraband Level (Shoulder Extension) Level 4 (Blue)   Row Strengthening;Both;20 reps;Theraband   Theraband Level (Shoulder Row) Level 4 (Blue)   Manual Therapy   Manual Therapy Soft tissue mobilization;Passive ROM   Soft tissue mobilization bil. suboccipitals and cervical paraspinals   Passive ROM bil. sidebending                PT Education - 10/10/14 1507    Education provided Yes   Education Details HEP: blue theraband, scap squeezes and bilateral shoulder extension   Person(s) Educated Patient   Methods Explanation;Demonstration   Comprehension Verbalized understanding;Returned demonstration          PT Short Term Goals - 09/18/14 1243    PT SHORT TERM GOAL #2   Title understand correct posture at the computer to decrease strain on cervical   Time 3   Period Weeks   Status Achieved   PT SHORT TERM GOAL #3   Title headache decreased >/= 25%   Time 3   Period Weeks   Status Achieved           PT Long Term Goals - 10/10/14 1451    PT LONG TERM GOAL #1   Title cervical pain with daily activities decreased >/= 75%   Time 6   Period Weeks   Status On-going   PT LONG TERM GOAL #2   Title headaches decreased >/= 75%   Status Achieved   PT LONG TERM GOAL #3   Title cervical muscle tension while at the computer decreased >/= 75% due ot increased postural muscle strength   Time 6   Period Weeks   Status On-going   PT LONG TERM GOAL #4   Title Independent with HEP   Time 6   Period Weeks   Status On-going  independent in current HEP               Plan - 10/10/14 1452    Clinical Impression Statement Pt with continued cervical stiffness especially when sitting at the the computer.  No significant change in function since renewal complete last session.  Pt with continued forward head especially when at the computer yet is making postural modifications to assist with this.  Pt  will continue to benefit from PT to imrpove posture and cervical flexibility.   Pt will benefit from skilled  therapeutic intervention in order to improve on the following deficits Decreased range of motion;Increased fascial restricitons;Increased muscle spasms;Decreased endurance;Decreased activity tolerance;Pain;Impaired flexibility;Hypomobility;Decreased strength;Decreased mobility   Rehab Potential Excellent   PT Frequency 2x / week   PT Duration 4 weeks   PT Treatment/Interventions Cryotherapy;Electrical Stimulation;Ultrasound;Moist Heat;Therapeutic activities;Therapeutic exercise;Neuromuscular re-education;Manual techniques;Patient/family education;Passive range of motion;Energy conservation   PT Next Visit Plan cervical/postural strengthening, cervical flexibility   Consulted and Agree with Plan of Care Patient        Problem List Patient Active Problem List   Diagnosis Date Noted  . Dyspnea 10/11/2012  . S/P thoracotomy 09/19/2012  . S/P lobectomy of lung 09/19/2012  . Hypoxemia 09/14/2012  . Pulmonary necrosis RUL s/p lobectomy   . ANXIETY STATE, UNSPECIFIED 02/10/2007  . IRITIS 02/10/2007  . BRADYCARDIA, CHRONIC 02/10/2007  . RHINITIS, VASOMOTOR 02/10/2007  . BENZODIAZEPINE ADDICTION 11/19/2006  . OSTEOPENIA 11/19/2006    Cailynn Bodnar, PT 10/10/2014, 3:29 PM  [facility] [address] [place], [place], [address] Phone: [phone]   Fax:  [phone]

## 2014-10-10 NOTE — Patient Instructions (Signed)
KEEP HEAD IN NEUTRAL AND SHOULDERS DOWN AND RELAXED   Hold tubing in right hand, arm forward. Pull arm back, elbow straight. Repeat __10__ times per set. Do __2__ sets per session. Do _1-2___ sessions per day.  Copyright  VHI. All rights reserved.     With resistive band anchored in door, grasp both ends. Keeping elbows bent, pull back, squeezing shoulder blades together. Hold _3__ seconds. Repeat _2x10___ times. Do _1-2___ sessions per day.  [email or web address].[email or web address]   [facility] [address], [address] [place], [place] [address] Phone # [phone] Fax [phone] 

## 2014-10-17 ENCOUNTER — Encounter: Payer: Self-pay | Admitting: Physical Therapy

## 2014-10-17 ENCOUNTER — Ambulatory Visit: Payer: Medicare PPO | Admitting: Physical Therapy

## 2014-10-17 DIAGNOSIS — M542 Cervicalgia: Secondary | ICD-10-CM | POA: Diagnosis not present

## 2014-10-17 NOTE — Therapy (Signed)
[facility] [address], [place], [place], [address] Phone: [phone]   Fax:  [phone]  Physical Therapy Treatment  Patient Details  Name: Tony Prince MRN: [id number] Date of Birth: Aug 13, 1932 Referring Provider:  Lajean Manes, MD  Encounter Date: 10/17/2014      PT End of Session - 10/17/14 1112    Visit Number 7   Number of Visits 10  Medicare   Date for PT Re-Evaluation 11/01/14   Authorization Type [employer]   Authorization Time Period 10/08/14-11/22/14   Authorization - Visit Number 2   Authorization - Number of Visits 3   PT Start Time 1100   PT Stop Time 1140   PT Time Calculation (min) 40 min   Activity Tolerance Patient tolerated treatment well   Behavior During Therapy [facility] for tasks assessed/performed      Past Medical History  Diagnosis Date  . Lung mass   . BPH (benign prostatic hyperplasia)   . Psoriasis   . Osteoporosis   . Anxiety   . Hiatal hernia   . Positional vertigo   . Prolapse of mitral valve     click  . Sebaceous cyst     left shoulder  . Allergic rhinitis   . CAD (coronary artery disease) 08/12/12    LAD calcification noted on CT  . GERD (gastroesophageal reflux disease)     currently not using antacid, hasn't for approx. one yr.   . History of stress test 08/2012    told that it was wnl  . Heart murmur     told that he has murmur by Dr. Felipa Eth   . Sinusitis     frequently  . Inguinal hernia     left side   . Exertional shortness of breath     "last 8-43yr" (09/14/2012)  . Arthritis     pt. reports that he has aches & pain, unsure if its related to degenerative process     . Pulmonary necrosis     RUL with resection ? underlying CA, neg pathology for CA  . Recurrent sinusitis   . Dry eyes   . Blurred vision     CHRONIC  . Thrombocytopenia     MILD  . MVP (mitral valve prolapse)     Past Surgical History  Procedure Laterality Date  . Appendectomy       1945  . Transurethral resection of prostate    . Tonsillectomy    . Inguinal hernia repair      "? side" (09/14/2012)  . Cataract extraction w/ intraocular lens  implant, bilateral    . Video assisted thoracoscopy (vats)/ lobectomy Right 09/14/2012  . Video assisted thoracoscopy (vats)/wedge resection Right 09/14/2012    Procedure: VIDEO ASSISTED THORACOSCOPY (VATS)/WEDGE RESECTION;  Surgeon: SMelrose Nakayama MD;  [place] [facility]  Service: Thoracic;  Laterality: Right;  possible chest wall resection  . Lobectomy Right 09/14/2012    Procedure: LOBECTOMY;  Surgeon: SMelrose Nakayama MD;  [place] [facility]  Service: Thoracic;  Laterality: Right;  . Thoracotomy/lobectomy Right 09/14/2012    Procedure: THORACOTOMY/LOBECTOMY;  Surgeon: SMelrose Nakayama MD;  [place] [facility]  Service: Thoracic;  Laterality: Right;    There were no vitals filed for this visit.  Visit Diagnosis:  Cervical pain      Subjective Assessment - 10/17/14 1111    Subjective I bought a new pillow.    Pertinent History Osteoporosis   Patient Stated Goals reduce pain  with activities; reduce muscle soreness   Currently in Pain? Yes   Pain Score 2    Pain Location Neck   Pain Orientation Right;Left   Pain Descriptors / Indicators Tightness   Pain Type Chronic pain   Pain Onset More than a month ago   Pain Frequency Intermittent   Aggravating Factors  computer, sleeping   Pain Relieving Factors stretching, rest   Multiple Pain Sites No                         [facility] Adult PT Treatment/Exercise - 10/17/14 0001    Neck Exercises: Supine   Upper Extremity D2 Theraband;20 reps  2 sets; with head press   Theraband Level (UE D2) Level 4 (Blue)   Other Supine Exercise horizontal abduction with blue band 20 times x2 with head press  supine on foam roll   Shoulder Exercises: Standing   Extension Strengthening;Both;20 reps;Theraband   Theraband Level (Shoulder Extension) Level 4 (Blue)    Row Strengthening;Both;20 reps;Theraband   Theraband Level (Shoulder Row) Level 4 (Blue)   Manual Therapy   Manual Therapy Soft tissue mobilization;Passive ROM   Soft tissue mobilization bil. suboccipitals and cervical paraspinals   Passive ROM bil. sidebending                PT Education - 10/17/14 1139    Education provided No          PT Short Term Goals - 09/18/14 1243    PT SHORT TERM GOAL #2   Title understand correct posture at the computer to decrease strain on cervical   Time 3   Period Weeks   Status Achieved   PT SHORT TERM GOAL #3   Title headache decreased >/= 25%   Time 3   Period Weeks   Status Achieved           PT Long Term Goals - 10/17/14 1139    PT LONG TERM GOAL #1   Title cervical pain with daily activities decreased >/= 75%   Time 6   Period Weeks   Status On-going   PT LONG TERM GOAL #2   Title headaches decreased >/= 75%   Time 6   Period Weeks   Status Achieved   PT LONG TERM GOAL #3   Title cervical muscle tension while at the computer decreased >/= 75% due ot increased postural muscle strength   Time 6   Period Weeks   Status On-going   PT LONG TERM GOAL #4   Title Independent with HEP   Time 6   Period Weeks   Status On-going               Plan - 10/17/14 1140    Clinical Impression Statement Patient has increased tightness in cervical musculature.  Patient is working on a pillow to use to decrease stiffness in morning.  Patient continues to perform his exercises and goes to the gym more. Patient has one more visit left to review HEP and update.    Pt will benefit from skilled therapeutic intervention in order to improve on the following deficits Decreased range of motion;Increased fascial restricitons;Increased muscle spasms;Decreased endurance;Decreased activity tolerance;Pain;Impaired flexibility;Hypomobility;Decreased strength;Decreased mobility   Rehab Potential Excellent   Clinical Impairments Affecting  Rehab Potential None   PT Frequency 2x / week   PT Duration 4 weeks   PT Treatment/Interventions Cryotherapy;Electrical Stimulation;Ultrasound;Moist Heat;Therapeutic activities;Therapeutic exercise;Neuromuscular re-education;Manual techniques;Patient/family education;Passive range of motion;Energy conservation  PT Next Visit Plan discharge due to insurance only approving 3 visits and next visit is #3   PT Home Exercise Plan progress as needed   Consulted and Agree with Plan of Care Patient        Problem List Patient Active Problem List   Diagnosis Date Noted  . Dyspnea 10/11/2012  . S/P thoracotomy 09/19/2012  . S/P lobectomy of lung 09/19/2012  . Hypoxemia 09/14/2012  . Pulmonary necrosis RUL s/p lobectomy   . ANXIETY STATE, UNSPECIFIED 02/10/2007  . IRITIS 02/10/2007  . BRADYCARDIA, CHRONIC 02/10/2007  . RHINITIS, VASOMOTOR 02/10/2007  . BENZODIAZEPINE ADDICTION 11/19/2006  . OSTEOPENIA 11/19/2006    GRAY,CHERYL,PT 10/17/2014, 11:43 AM  [facility] [address] [place], [place], [address] Phone: [phone]   Fax:  [phone]

## 2014-10-19 ENCOUNTER — Ambulatory Visit: Payer: Self-pay | Admitting: Physical Therapy

## 2014-10-24 ENCOUNTER — Ambulatory Visit: Payer: Medicare PPO | Attending: Geriatric Medicine

## 2014-10-24 DIAGNOSIS — M542 Cervicalgia: Secondary | ICD-10-CM | POA: Insufficient documentation

## 2014-10-24 NOTE — Therapy (Signed)
[facility] [address] [place], [place], [address] Phone: [phone]   Fax:  [phone]  Physical Therapy Treatment  Patient Details  Name: Tony Prince MRN: [id number] Date of Birth: 03-05-1933 Referring Provider:  Lajean Manes, MD  Encounter Date: 10/24/2014      PT End of Session - 10/24/14 1240    Visit Number 8   PT Start Time 0315   PT Stop Time 1304   PT Time Calculation (min) 31 min   Activity Tolerance Patient tolerated treatment well   Behavior During Therapy [facility] for tasks assessed/performed      Past Medical History  Diagnosis Date  . Lung mass   . BPH (benign prostatic hyperplasia)   . Psoriasis   . Osteoporosis   . Anxiety   . Hiatal hernia   . Positional vertigo   . Prolapse of mitral valve     click  . Sebaceous cyst     left shoulder  . Allergic rhinitis   . CAD (coronary artery disease) 08/12/12    LAD calcification noted on CT  . GERD (gastroesophageal reflux disease)     currently not using antacid, hasn't for approx. one yr.   . History of stress test 08/2012    told that it was wnl  . Heart murmur     told that he has murmur by Dr. Felipa Eth   . Sinusitis     frequently  . Inguinal hernia     left side   . Exertional shortness of breath     "last 8-33yr" (09/14/2012)  . Arthritis     pt. reports that he has aches & pain, unsure if its related to degenerative process     . Pulmonary necrosis     RUL with resection ? underlying CA, neg pathology for CA  . Recurrent sinusitis   . Dry eyes   . Blurred vision     CHRONIC  . Thrombocytopenia     MILD  . MVP (mitral valve prolapse)     Past Surgical History  Procedure Laterality Date  . Appendectomy      1945  . Transurethral resection of prostate    . Tonsillectomy    . Inguinal hernia repair      "? side" (09/14/2012)  . Cataract extraction w/ intraocular lens  implant, bilateral    . Video assisted  thoracoscopy (vats)/ lobectomy Right 09/14/2012  . Video assisted thoracoscopy (vats)/wedge resection Right 09/14/2012    Procedure: VIDEO ASSISTED THORACOSCOPY (VATS)/WEDGE RESECTION;  Surgeon: SMelrose Nakayama MD;  [place] [facility]  Service: Thoracic;  Laterality: Right;  possible chest wall resection  . Lobectomy Right 09/14/2012    Procedure: LOBECTOMY;  Surgeon: SMelrose Nakayama MD;  [place] [facility]  Service: Thoracic;  Laterality: Right;  . Thoracotomy/lobectomy Right 09/14/2012    Procedure: THORACOTOMY/LOBECTOMY;  Surgeon: SMelrose Nakayama MD;  [place] [facility]  Service: Thoracic;  Laterality: Right;    There were no vitals filed for this visit.  Visit Diagnosis:  Cervical pain      Subjective Assessment - 10/24/14 1235    Subjective Ready for D/C.  Will do exercises for strength   Pertinent History Osteoporosis   Currently in Pain? Yes   Pain Score 1    Pain Location Neck   Pain Orientation Right;Left   Pain Descriptors / Indicators Tightness   Pain Type Chronic pain   Pain Onset More than a month ago  Pain Frequency Intermittent   Aggravating Factors  computer, sleeping   Pain Relieving Factors stretching, rest                         [facility] Adult PT Treatment/Exercise - November 23, 2014 0001    Neck Exercises: Supine   Upper Extremity D2 Theraband;20 reps  2 sets; with head press   Theraband Level (UE D2) Level 4 (Blue)   Other Supine Exercise horizontal abduction with blue band 20 times x2 with head press  supine on foam roll   Other Supine Exercise Foam roll x 3 min   Shoulder Exercises: ROM/Strengthening   UBE (Upper Arm Bike) going backwards 7 min level 3 with verbal cues for posture.    Manual Therapy   Manual Therapy Passive ROM;Soft tissue mobilization   Soft tissue mobilization bil. suboccipitals and cervical paraspinals   Passive ROM bil. sidebending                  PT Short Term Goals - 09/18/14 1243    PT SHORT TERM  GOAL #2   Title understand correct posture at the computer to decrease strain on cervical   Time 3   Period Weeks   Status Achieved   PT SHORT TERM GOAL #3   Title headache decreased >/= 25%   Time 3   Period Weeks   Status Achieved           PT Long Term Goals - 11/23/14 1236    PT LONG TERM GOAL #1   Title cervical pain with daily activities decreased >/= 75%   Time 6   Period Weeks   Status Partially Met  25% better   PT LONG TERM GOAL #2   Title headaches decreased >/= 75%   Status Achieved   PT LONG TERM GOAL #3   Title cervical muscle tension while at the computer decreased >/= 75% due ot increased postural muscle strength   Status Partially Met  10%    PT LONG TERM GOAL #4   Title Independent with HEP   Status Achieved               Plan - 11/23/14 1237    Clinical Impression Statement Pt with 100% improvement in headaches since the start of care.  Pt reports 25% reduction in overall neck pain and 10% reduction in stiffness with computer use.  Pt has HEP in place for flexibility and strength for continued gains.     PT Next Visit Plan D/C PT to HEP   Consulted and Agree with Plan of Care Patient          G-Codes - Nov 23, 2014 1303    Functional Assessment Tool Used FOTO & clinical   Functional Limitation Carrying, moving and handling objects   Carrying, Moving and Handling Objects Goal Status (D3570) At least 20 percent but less than 40 percent impaired, limited or restricted   Carrying, Moving and Handling Objects Discharge Status [id number]) At least 20 percent but less than 40 percent impaired, limited or restricted      Problem List Patient Active Problem List   Diagnosis Date Noted  . Dyspnea 10/11/2012  . S/P thoracotomy 09/19/2012  . S/P lobectomy of lung 09/19/2012  . Hypoxemia 09/14/2012  . Pulmonary necrosis RUL s/p lobectomy   . ANXIETY STATE, UNSPECIFIED 02/10/2007  . IRITIS 02/10/2007  . BRADYCARDIA, CHRONIC 02/10/2007  . RHINITIS,  VASOMOTOR 02/10/2007  . BENZODIAZEPINE ADDICTION 11/19/2006  .  OSTEOPENIA 11/19/2006   PHYSICAL THERAPY DISCHARGE SUMMARY  Visits from Start of Care: 8  Current functional level related to goals / functional outcomes: See above    Remaining deficits: Pt with chronic postural dysfunction, neck pain and stiffness.  Pt has HEP in place to address remaining deficits.     Education / Equipment: Posture education, HEP Plan: Patient agrees to discharge.  Patient goals were partially met. Patient is being discharged due to being pleased with the current functional level.  ?????     Hisayo Delossantos, PT 10/24/2014, 1:05 PM  [facility] [address], [place], [place], [address] Phone: [phone]   Fax:  [phone]

## 2014-10-25 ENCOUNTER — Encounter: Payer: Self-pay | Admitting: Physical Therapy

## 2014-10-29 ENCOUNTER — Encounter: Payer: Self-pay | Admitting: Physical Therapy

## 2014-11-01 ENCOUNTER — Encounter: Payer: Self-pay | Admitting: Physical Therapy

## 2014-11-02 DIAGNOSIS — R319 Hematuria, unspecified: Secondary | ICD-10-CM | POA: Diagnosis not present

## 2014-11-05 ENCOUNTER — Other Ambulatory Visit: Payer: Self-pay | Admitting: Geriatric Medicine

## 2014-11-05 ENCOUNTER — Encounter: Payer: Self-pay | Admitting: Physical Therapy

## 2014-11-05 DIAGNOSIS — R0789 Other chest pain: Secondary | ICD-10-CM

## 2014-11-05 DIAGNOSIS — R319 Hematuria, unspecified: Secondary | ICD-10-CM | POA: Diagnosis not present

## 2014-11-07 ENCOUNTER — Encounter: Payer: Self-pay | Admitting: Physical Therapy

## 2014-11-07 ENCOUNTER — Other Ambulatory Visit: Payer: Self-pay

## 2014-11-09 ENCOUNTER — Ambulatory Visit
Admission: RE | Admit: 2014-11-09 | Discharge: 2014-11-09 | Disposition: A | Discharge status: Home / Self Care | Payer: Medicare PPO | Source: Ambulatory Visit | Attending: Geriatric Medicine | Admitting: Geriatric Medicine

## 2014-11-09 ENCOUNTER — Other Ambulatory Visit: Payer: Self-pay | Admitting: Geriatric Medicine

## 2014-11-09 DIAGNOSIS — R0789 Other chest pain: Secondary | ICD-10-CM

## 2014-11-09 DIAGNOSIS — Z87891 Personal history of nicotine dependence: Secondary | ICD-10-CM | POA: Diagnosis not present

## 2014-11-09 DIAGNOSIS — J449 Chronic obstructive pulmonary disease, unspecified: Secondary | ICD-10-CM | POA: Diagnosis not present

## 2014-11-28 DIAGNOSIS — R310 Gross hematuria: Secondary | ICD-10-CM | POA: Diagnosis not present

## 2014-11-28 DIAGNOSIS — N401 Enlarged prostate with lower urinary tract symptoms: Secondary | ICD-10-CM | POA: Diagnosis not present

## 2014-11-28 DIAGNOSIS — N138 Other obstructive and reflux uropathy: Secondary | ICD-10-CM | POA: Diagnosis not present

## 2014-12-05 ENCOUNTER — Encounter: Payer: Self-pay | Admitting: Podiatry

## 2014-12-05 ENCOUNTER — Ambulatory Visit (INDEPENDENT_AMBULATORY_CARE_PROVIDER_SITE_OTHER): Payer: Medicare PPO | Admitting: Podiatry

## 2014-12-05 VITALS — BP 131/64 | HR 52 | Resp 16

## 2014-12-05 DIAGNOSIS — M205X2 Other deformities of toe(s) (acquired), left foot: Secondary | ICD-10-CM | POA: Diagnosis not present

## 2014-12-05 DIAGNOSIS — L84 Corns and callosities: Secondary | ICD-10-CM

## 2014-12-05 NOTE — Progress Notes (Signed)
Subjective:     Patient ID: Tony Prince, male   DOB: March 11, 1933, 81 y.o.   MRN: [id number]  HPI patient presents with painful callus underneath the fifth metatarsal left with keratotic tissue formation and he states that the results no longer last as long   Review of Systems     Objective:   Physical Exam Neurovascular status unchanged with painful keratotic lesion sub-fifth metatarsal left with enlargement around the head of the bone    Assessment:     Plantarflexed metatarsal with keratotic lesion formation    Plan:     Discussed correction of deformity which would include fifth metatarsal head resection. Work and continue to try to treat conservatively and today debridement was accomplished and padding applied and we will see him back when symptomatic

## 2014-12-06 ENCOUNTER — Ambulatory Visit
Admission: RE | Admit: 2014-12-06 | Discharge: 2014-12-06 | Disposition: A | Discharge status: Home / Self Care | Payer: Medicare PPO | Source: Ambulatory Visit | Attending: Geriatric Medicine | Admitting: Geriatric Medicine

## 2014-12-06 ENCOUNTER — Other Ambulatory Visit: Payer: Self-pay | Admitting: Geriatric Medicine

## 2014-12-06 ENCOUNTER — Encounter: Payer: Self-pay | Admitting: Podiatry

## 2014-12-06 ENCOUNTER — Ambulatory Visit (INDEPENDENT_AMBULATORY_CARE_PROVIDER_SITE_OTHER): Payer: Medicare PPO | Admitting: Podiatry

## 2014-12-06 VITALS — BP 144/74 | HR 52 | Temp 96.9°F

## 2014-12-06 DIAGNOSIS — J209 Acute bronchitis, unspecified: Secondary | ICD-10-CM | POA: Diagnosis not present

## 2014-12-06 DIAGNOSIS — J40 Bronchitis, not specified as acute or chronic: Secondary | ICD-10-CM

## 2014-12-06 DIAGNOSIS — J329 Chronic sinusitis, unspecified: Secondary | ICD-10-CM | POA: Diagnosis not present

## 2014-12-06 DIAGNOSIS — M205X2 Other deformities of toe(s) (acquired), left foot: Secondary | ICD-10-CM

## 2014-12-06 DIAGNOSIS — R05 Cough: Secondary | ICD-10-CM | POA: Diagnosis not present

## 2014-12-06 DIAGNOSIS — R062 Wheezing: Secondary | ICD-10-CM | POA: Diagnosis not present

## 2014-12-06 NOTE — Progress Notes (Signed)
   Subjective:    Patient ID: Tony Prince, male    DOB: 23-Dec-1932, 81 y.o.   MRN: [id number]  HPI "He trimmed a callus for me yesterday. I took my shoe off the end of the day and my bandage was full of blood.  I'm worried about getting infection.  I want someone to look at it, put a bandage on it and clean it.  I'm concerned about having it open all day, infection setting in."    Review of Systems     Objective:   Physical Exam        Assessment & Plan:

## 2014-12-07 NOTE — Progress Notes (Signed)
Subjective:     Patient ID: Tony Prince, male   DOB: 1932-09-11, 81 y.o.   MRN: [id number]  HPI I was concerned yesterday because I have blood in my shoe after I saw you but today it seems better   Review of Systems     Objective:   Physical Exam Neurovascular status unchanged with a callus formation left fifth metatarsal that may have had a small amount of bleeding but today shows no bleeding or drainage or proximal edema erythema    Assessment:     Keratotic lesion debrided yesterday left that may have bled slightly but appears to be healed at this point    Plan:     Applied padding around the area and sterile dressing and instructed if any redness any drainage or any pain were to occur to reappoint immediately but it should completely heal uneventfully

## 2014-12-27 DIAGNOSIS — H04123 Dry eye syndrome of bilateral lacrimal glands: Secondary | ICD-10-CM | POA: Diagnosis not present

## 2015-01-14 DIAGNOSIS — N138 Other obstructive and reflux uropathy: Secondary | ICD-10-CM | POA: Diagnosis not present

## 2015-01-14 DIAGNOSIS — N401 Enlarged prostate with lower urinary tract symptoms: Secondary | ICD-10-CM | POA: Diagnosis not present

## 2015-01-14 DIAGNOSIS — R310 Gross hematuria: Secondary | ICD-10-CM | POA: Diagnosis not present

## 2015-01-18 DIAGNOSIS — N200 Calculus of kidney: Secondary | ICD-10-CM | POA: Diagnosis not present

## 2015-01-18 DIAGNOSIS — R310 Gross hematuria: Secondary | ICD-10-CM | POA: Diagnosis not present

## 2015-01-24 DIAGNOSIS — C672 Malignant neoplasm of lateral wall of bladder: Secondary | ICD-10-CM | POA: Diagnosis not present

## 2015-01-24 DIAGNOSIS — R310 Gross hematuria: Secondary | ICD-10-CM | POA: Diagnosis not present

## 2015-01-28 ENCOUNTER — Other Ambulatory Visit: Payer: Self-pay | Admitting: Urology

## 2015-02-04 ENCOUNTER — Encounter (HOSPITAL_BASED_OUTPATIENT_CLINIC_OR_DEPARTMENT_OTHER): Payer: Self-pay | Admitting: *Deleted

## 2015-02-04 NOTE — Progress Notes (Signed)
NPO AFTER MN.  ARRIVE AT 7972.  NEEDS HG. CURRENT CXR IN CHART AND EPIC.

## 2015-02-07 ENCOUNTER — Encounter (HOSPITAL_BASED_OUTPATIENT_CLINIC_OR_DEPARTMENT_OTHER): Payer: Self-pay

## 2015-02-07 ENCOUNTER — Ambulatory Visit (HOSPITAL_BASED_OUTPATIENT_CLINIC_OR_DEPARTMENT_OTHER): Payer: Medicare PPO | Admitting: Anesthesiology

## 2015-02-07 ENCOUNTER — Encounter (HOSPITAL_COMMUNITY)
Admission: RE | Disposition: A | Discharge status: Home / Self Care | Payer: Self-pay | Source: Ambulatory Visit | Attending: Urology

## 2015-02-07 ENCOUNTER — Observation Stay (HOSPITAL_BASED_OUTPATIENT_CLINIC_OR_DEPARTMENT_OTHER)
Admission: RE | Admit: 2015-02-07 | Discharge: 2015-02-08 | Disposition: A | Discharge status: Home / Self Care | Payer: Medicare PPO | Source: Ambulatory Visit | Attending: Urology | Admitting: Urology

## 2015-02-07 DIAGNOSIS — Z87891 Personal history of nicotine dependence: Secondary | ICD-10-CM | POA: Diagnosis not present

## 2015-02-07 DIAGNOSIS — C679 Malignant neoplasm of bladder, unspecified: Secondary | ICD-10-CM | POA: Diagnosis not present

## 2015-02-07 DIAGNOSIS — C672 Malignant neoplasm of lateral wall of bladder: Secondary | ICD-10-CM | POA: Diagnosis present

## 2015-02-07 DIAGNOSIS — F419 Anxiety disorder, unspecified: Secondary | ICD-10-CM | POA: Diagnosis not present

## 2015-02-07 DIAGNOSIS — K219 Gastro-esophageal reflux disease without esophagitis: Secondary | ICD-10-CM | POA: Insufficient documentation

## 2015-02-07 DIAGNOSIS — Z9841 Cataract extraction status, right eye: Secondary | ICD-10-CM | POA: Diagnosis not present

## 2015-02-07 DIAGNOSIS — K4090 Unilateral inguinal hernia, without obstruction or gangrene, not specified as recurrent: Secondary | ICD-10-CM | POA: Insufficient documentation

## 2015-02-07 DIAGNOSIS — I2510 Atherosclerotic heart disease of native coronary artery without angina pectoris: Secondary | ICD-10-CM | POA: Insufficient documentation

## 2015-02-07 DIAGNOSIS — L409 Psoriasis, unspecified: Secondary | ICD-10-CM | POA: Diagnosis not present

## 2015-02-07 DIAGNOSIS — Z961 Presence of intraocular lens: Secondary | ICD-10-CM | POA: Diagnosis not present

## 2015-02-07 DIAGNOSIS — Z79899 Other long term (current) drug therapy: Secondary | ICD-10-CM | POA: Diagnosis not present

## 2015-02-07 DIAGNOSIS — J449 Chronic obstructive pulmonary disease, unspecified: Secondary | ICD-10-CM | POA: Insufficient documentation

## 2015-02-07 DIAGNOSIS — Z9842 Cataract extraction status, left eye: Secondary | ICD-10-CM | POA: Insufficient documentation

## 2015-02-07 DIAGNOSIS — N400 Enlarged prostate without lower urinary tract symptoms: Secondary | ICD-10-CM | POA: Insufficient documentation

## 2015-02-07 DIAGNOSIS — M1990 Unspecified osteoarthritis, unspecified site: Secondary | ICD-10-CM | POA: Diagnosis not present

## 2015-02-07 DIAGNOSIS — K449 Diaphragmatic hernia without obstruction or gangrene: Secondary | ICD-10-CM | POA: Insufficient documentation

## 2015-02-07 HISTORY — DX: Chronic obstructive pulmonary disease, unspecified: J44.9

## 2015-02-07 HISTORY — DX: Personal history of other diseases of the respiratory system: Z87.09

## 2015-02-07 HISTORY — PX: TRANSURETHRAL RESECTION OF BLADDER TUMOR WITH GYRUS (TURBT-GYRUS): SHX6458

## 2015-02-07 HISTORY — DX: Malignant neoplasm of bladder, unspecified: C67.9

## 2015-02-07 HISTORY — DX: Dry eye syndrome of unspecified lacrimal gland: H04.129

## 2015-02-07 HISTORY — DX: Unspecified symptoms and signs involving the genitourinary system: R39.9

## 2015-02-07 LAB — POCT HEMOGLOBIN-HEMACUE: HEMOGLOBIN: 14.9 g/dL (ref 13.0–17.0)

## 2015-02-07 SURGERY — TRANSURETHRAL RESECTION OF BLADDER TUMOR WITH GYRUS (TURBT-GYRUS)
Anesthesia: General

## 2015-02-07 MED ORDER — CEFAZOLIN SODIUM-DEXTROSE 2-3 GM-% IV SOLR
2.0000 g | INTRAVENOUS | Status: AC
  Administered 2015-02-07: 2 g via INTRAVENOUS
  Filled 2015-02-07: qty 50

## 2015-02-07 MED ORDER — ONDANSETRON HCL 4 MG/2ML IJ SOLN
INTRAMUSCULAR | Status: DC | PRN
  Administered 2015-02-07: 4 mg via INTRAVENOUS

## 2015-02-07 MED ORDER — POLYETHYL GLYCOL-PROPYL GLYCOL 0.4-0.3 % OP SOLN: 1.0000 [drp] | Freq: Two times a day (BID) | OPHTHALMIC | Status: DC | PRN

## 2015-02-07 MED ORDER — BELLADONNA ALKALOIDS-OPIUM 16.2-60 MG RE SUPP
1.0000 | Freq: Four times a day (QID) | RECTAL | Status: DC | PRN
  Administered 2015-02-07: 1 via RECTAL
  Filled 2015-02-07 (×2): qty 1

## 2015-02-07 MED ORDER — FENTANYL CITRATE (PF) 100 MCG/2ML IJ SOLN
25.0000 ug | INTRAMUSCULAR | Status: DC | PRN
  Administered 2015-02-07 (×4): 25 ug via INTRAVENOUS
  Filled 2015-02-07: qty 1

## 2015-02-07 MED ORDER — DOCUSATE SODIUM 100 MG PO CAPS
100.0000 mg | ORAL_CAPSULE | Freq: Every day | ORAL | Status: DC
  Administered 2015-02-07: 100 mg via ORAL
  Filled 2015-02-07 (×2): qty 1

## 2015-02-07 MED ORDER — LACTATED RINGERS IV SOLN
INTRAVENOUS | Status: DC
Start: 2015-02-07 — End: 2015-02-07
  Administered 2015-02-07: 12:00:00 via INTRAVENOUS
  Filled 2015-02-07: qty 1000

## 2015-02-07 MED ORDER — ACETAMINOPHEN 325 MG PO TABS
650.0000 mg | ORAL_TABLET | ORAL | Status: DC | PRN
  Administered 2015-02-07: 650 mg via ORAL
  Filled 2015-02-07: qty 2

## 2015-02-07 MED ORDER — FENTANYL CITRATE (PF) 100 MCG/2ML IJ SOLN
INTRAMUSCULAR | Status: DC | PRN
  Administered 2015-02-07: 25 ug via INTRAVENOUS
  Administered 2015-02-07: 50 ug via INTRAVENOUS
  Administered 2015-02-07: 25 ug via INTRAVENOUS

## 2015-02-07 MED ORDER — CEFAZOLIN SODIUM 1-5 GM-% IV SOLN
1.0000 g | INTRAVENOUS | Status: DC
  Filled 2015-02-07: qty 50

## 2015-02-07 MED ORDER — SULFAMETHOXAZOLE-TRIMETHOPRIM 800-160 MG PO TABS
1.0000 | ORAL_TABLET | Freq: Two times a day (BID) | ORAL | Status: DC
  Administered 2015-02-07 – 2015-02-08 (×2): 1 via ORAL
  Filled 2015-02-07 (×2): qty 1

## 2015-02-07 MED ORDER — CEFAZOLIN SODIUM-DEXTROSE 2-3 GM-% IV SOLR
INTRAVENOUS | Status: AC
  Filled 2015-02-07: qty 50

## 2015-02-07 MED ORDER — LIDOCAINE HCL (CARDIAC) 20 MG/ML IV SOLN
INTRAVENOUS | Status: DC | PRN
  Administered 2015-02-07: 60 mg via INTRAVENOUS

## 2015-02-07 MED ORDER — HYDROCODONE-ACETAMINOPHEN 5-325 MG PO TABS
1.0000 | ORAL_TABLET | ORAL | Status: DC | PRN
Start: 2015-02-07 — End: 2015-02-08
  Administered 2015-02-07 – 2015-02-08 (×3): 2 via ORAL
  Filled 2015-02-07 (×4): qty 2

## 2015-02-07 MED ORDER — POLYVINYL ALCOHOL 1.4 % OP SOLN
1.0000 [drp] | OPHTHALMIC | Status: DC | PRN
  Filled 2015-02-07: qty 15

## 2015-02-07 MED ORDER — DEXAMETHASONE SODIUM PHOSPHATE 4 MG/ML IJ SOLN
INTRAMUSCULAR | Status: DC | PRN
  Administered 2015-02-07: 10 mg via INTRAVENOUS

## 2015-02-07 MED ORDER — ONDANSETRON HCL 4 MG/2ML IJ SOLN: 4.0000 mg | INTRAMUSCULAR | Status: DC | PRN

## 2015-02-07 MED ORDER — PROPOFOL 10 MG/ML IV BOLUS
INTRAVENOUS | Status: DC | PRN
  Administered 2015-02-07: 140 mg via INTRAVENOUS

## 2015-02-07 MED ORDER — POLYETHYLENE GLYCOL 3350 17 G PO PACK: 17.0000 g | PACK | Freq: Every day | ORAL | Status: DC | PRN

## 2015-02-07 SURGICAL SUPPLY — 30 items
BAG DRAIN URO-CYSTO SKYTR STRL (DRAIN) ×6 IMPLANT
BAG URINE DRAINAGE (UROLOGICAL SUPPLIES) ×3 IMPLANT
BAG URINE LEG 19OZ MD ST LTX (BAG) IMPLANT
CATH FOLEY 2WAY SLVR  5CC 20FR (CATHETERS)
CATH FOLEY 2WAY SLVR  5CC 22FR (CATHETERS)
CATH FOLEY 2WAY SLVR 5CC 20FR (CATHETERS) IMPLANT
CATH FOLEY 2WAY SLVR 5CC 22FR (CATHETERS) IMPLANT
CATH FOLEY 3WAY 30CC 22F (CATHETERS) ×3 IMPLANT
CLOTH BEACON ORANGE TIMEOUT ST (SAFETY) ×6 IMPLANT
ELECT BUTTON BIOP 24F 90D PLAS (MISCELLANEOUS) ×3 IMPLANT
ELECT REM PT RETURN 9FT ADLT (ELECTROSURGICAL) ×3
ELECTRODE REM PT RTRN 9FT ADLT (ELECTROSURGICAL) ×1 IMPLANT
EVACUATOR MICROVAS BLADDER (UROLOGICAL SUPPLIES) IMPLANT
GLOVE BIO SURGEON STRL SZ8 (GLOVE) ×6 IMPLANT
GOWN STRL REUS W/ TWL XL LVL3 (GOWN DISPOSABLE) ×2 IMPLANT
GOWN STRL REUS W/TWL XL LVL3 (GOWN DISPOSABLE) ×4
GOWN XL W/COTTON TOWEL STD (GOWNS) ×6 IMPLANT
HOLDER FOLEY CATH W/STRAP (MISCELLANEOUS) ×3 IMPLANT
KIT ROOM TURNOVER WOR (KITS) ×6 IMPLANT
LOOP CUT BIPOLAR 24F LRG (ELECTROSURGICAL) ×3 IMPLANT
MANIFOLD NEPTUNE II (INSTRUMENTS) ×3 IMPLANT
NEEDLE HYPO 22GX1.5 SAFETY (NEEDLE) IMPLANT
NS IRRIG 500ML POUR BTL (IV SOLUTION) IMPLANT
PACK CYSTO (CUSTOM PROCEDURE TRAY) ×6 IMPLANT
PLUG CATH AND CAP STER (CATHETERS) IMPLANT
SET ASPIRATION TUBING (TUBING) ×3 IMPLANT
SYRINGE IRR TOOMEY STRL 70CC (SYRINGE) ×3 IMPLANT
TUBE CONNECTING 12'X1/4 (SUCTIONS) ×1
TUBE CONNECTING 12X1/4 (SUCTIONS) ×2 IMPLANT
WATER STERILE IRR 3000ML UROMA (IV SOLUTION) ×6 IMPLANT

## 2015-02-07 NOTE — Discharge Instructions (Signed)
Transurethral Resection of Bladder Tumor (TURBT)  °Definition:  °Transurethral Resection of the Bladder Tumor is a surgical procedure used to diagnose and remove tumors within the bladder. TURBT is the most common treatment for early stage bladder cancer.  ° °General instructions:  °Your recent bladder surgery requires very little post hospital care but some definite precautions.  °Despite the fact that no skin incisions were used, the area around the tumor removal site is raw and covered with scabs to promote healing and prevent bleeding. Certain precautions are needed to insure that the scabs are not disturbed over the next 2-4 weeks while the healing proceeds.  °Because the raw surface inside your bladder and the irritating effects of urine you may expect frequency of urination and/or urgency (a stronger desire to urinate) and perhaps even getting up at night more often. This will usually resolve or improve slowly over the healing period. You may see some blood in your urine over the first 6 weeks. Do not be alarmed, even if the urine was clear for a while. Get off your feet and drink lots of fluids until clearing occurs. If you start to pass clots or don't improve call us.  ° °Diet:  °You may return to your normal diet immediately. Because of the raw surface of your bladder, alcohol, spicy foods, foods high in acid and drinks with caffeine may cause irritation or frequency and should be used in moderation. To keep your urine flowing freely and avoid constipation, drink plenty of fluids during the day (8-10 glasses). Tip: Avoid cranberry juice because it is very acidic. °  °Activity:  °Your physical activity needs to be restricted somewhat.  We suggest that you reduce your activity under the circumstances until the bleeding has stopped. Heavy lifting (greater than 20 lbs.) and heavy exertion should be limited for 2-3 weeks. ° °Bowels:  °It is important to keep your bowels regular during the postoperative period.  Straining with bowel movements can cause bleeding. A bowel movement every other day is reasonable. Use a mild laxative if needed, such as milk of magnesia 2-3 tablespoons, or 2 Dulcolax tablets. Call if you continue to have problems. If you had been taking narcotics for pain, before, during or after your surgery, you may be constipated.  ° °Medication:  °You should resume your pre-surgery medications unless told not to. In addition you may be given an antibiotic to prevent or treat infection. Antibiotics are not always necessary. All medication should be taken as prescribed until the bottles are finished unless you are having an unusual reaction to one of the drugs.  °General Anesthetic, Adult  °A doctor specialized in giving anesthesia (anesthesiologist) or a nurse specialized in giving anesthesia (nurse anesthetist) gives medicine that makes you sleep while a procedure is performed (general anesthetic). Once the general anesthetic has been administered, you will be in a sleeplike state in which you feel no pain. After having a general anesthetic you may feel:  °Dizzy.  °Weak.  °Drowsy.  °Confused.  °These feelings are normal and can be expected to last for up to 24 hours after the procedure. ° ° °LET YOUR CAREGIVER KNOW ABOUT:  °Allergies you have.  °Medications you are taking, including herbs, eye drops, over the counter medications, dietary supplements, and creams.  °Previous problems you have had with anesthetics or numbing medicines.  °Use of cigarettes, alcohol, or illicit drugs.  °Possibility of pregnancy, if this applies.  °History of bleeding or blood disorders, including blood clots and clotting   disorders.  °Previous surgeries you have had and types of anesthetics you have received.  °Family medical history, especially anesthetic problems.  °Other health problems.  ° °AFTER THE PROCEDURE  °After surgery, you will be taken to the recovery area where a nurse will monitor your progress. You will be allowed  to go home when you are awake, stable, taking fluids well, and without serious pain or complications.  °For the first 24 hours following an anesthetic:  °Have a responsible person with you.  °Do not drive a car. If you are alone, do not take public transportation.  °Do not engage in strenuous activity. You may usually resume normal activities the next day, or as advised by your caregiver.  °Do not drink alcohol.  °Do not take medicine that has not been prescribed by your caregiver.  °Do not sign important papers or make important decisions as your judgement may be impaired.  °You may resume a normal diet as directed.  °Change bandages (dressings) as directed.  °Only take over-the-counter or prescription medicines for pain, discomfort, or fever as directed by your caregiver.  °If you have questions or problems that seem related to the anesthetic, call the hospital and ask for the anesthetist, anesthesiologist, or anesthesia department.  ° °SEEK IMMEDIATE MEDICAL CARE IF:  °You develop a rash.  °You have difficulty breathing.  °You have chest pain.  °You have allergic problems.  °You have uncontrolled nausea.  °You have uncontrolled vomiting.  °You develop any serious bleeding, especially from the incision site.  °Document Released: 06/16/2007 Document Revised: 11/19/2010 Document Reviewed: 07/10/2010  °Exit[employer]® Patient Information ©2012 [employer].  ° ° °

## 2015-02-07 NOTE — Op Note (Signed)
Preoperative diagnosis: Bladder tumor, 3 cm  Postoperative diagnosis: Bladder tumor, 5 cm in diameter   Procedure: Cystoscopy, TURBT of 5 cm bladder tumor    Surgeon: Lillette Boxer. Bayne Fosnaugh, M.D.   Anesthesia: Gen.   Complications: None  Specimen(s): 1. Bladder tumor  2. Bladder tumor base  Drain(s): 54 French three-way Foley catheter to straight drainage and CBI  Indications: 81 year old male with recent diagnosis bladder tumor on his left sidewall. This was found upon evaluation for gross hematuria. CT evidence revealed a 3 cm bladder tumor. No other abnormalities were noted on recent cystoscopy other than some widemouthed bladder diverticulae just. The trigone. He presents at this time for TURBT with possible mitomycin. Risks and complications of the procedur have been discussed with the patient. He understands these and agrees to proceed.e    Technique and findings:The patient was properly identified in the holding area. He received preoperative IV Ancef. He was taken the operating room where general anesthetic was administered with the LMA. He was placed in the dorsolithotomy position. Genitalia and perineum were prepped and draped. Proper timeout was called.  A 23 French panendoscope was advanced under direct vision through his urethra. Urethra was free of lesions. Prostate was nonobstructive. There were no prostatic urethral lesions. The bladder was inspected circumferentially both with the [facility] and 70 lenses. There were wide mouth diverticula present superior to the trigone. Ureteral orifices were normal in configuration and location. The only urothelial lesions noted were on the left sidewall. There were some papillary lesions, approximately 2, each of these was approximately 1-1-1/2 cm in size. One of these had a clot located superficially. There were superficial papillary changes on the bladder wall around these, with a diameter approximately 5 cm. I did not see any nodularity  with these lesions. The cystoscope was removed. Using the obturator, the resectoscope was placed through a 28 French sheath. The loop was placed. The papillary lesions were resected using the loop and cutting current. After a small amount of resection, it was obvious that the bladder was very thin-walled. Because of this, I tried to resect without distending the bladder. The papillary lesions were resected. Unfortunately, because of the cutting artifact, a large amount of bladder tumor specimen was not obtained. Small bits of bladder tumor were sent for pathology, labeled "bladder tumor" area. Once the entire lesion was resected well into the muscular layer, I then used the cold cup forceps to get deep tissue, this specimen was sent labeled "bladder tumor base". Following this, all small bleeders were coagulated. Urothelium surrounding the resected site was coagulated using the loop. No significant bleeding was seen at this point. Because it was evident the bladder was very thin-walled, I did not think it judicious to instill mitomycin following the procedure.  Once adequate hemostasis was achieved, the scope was removed. I then placed a 22 French three-way Foley catheter, with the balloon filled with 20 mL of water. This was hooked to CBI.  The patient tolerated procedure well. He was awakened, then taken to the PACU in stable condition.

## 2015-02-07 NOTE — Anesthesia Preprocedure Evaluation (Addendum)
Anesthesia Evaluation  Patient identified by MRN, date of birth, ID band Patient awake    Reviewed: Allergy & Precautions, H&P , NPO status , Patient's Chart, lab work & pertinent test results  Airway Mallampati: II  TM Distance: >3 FB Neck ROM: full    Dental no notable dental hx. (+) Dental Advisory Given, Teeth Intact, Poor Dentition,    Pulmonary shortness of breath and with exertion, COPD, former smoker,    Pulmonary exam normal breath sounds clear to auscultation       Cardiovascular Exercise Tolerance: Good + CAD  Normal cardiovascular exam+ dysrhythmias + Valvular Problems/Murmurs MVP  Rhythm:regular Rate:Normal  Stress test 6/14 - pt reports WNL. Chronic bradycardia   Neuro/Psych  Headaches, PSYCHIATRIC DISORDERS Anxiety negative neurological ROS  negative psych ROS   GI/Hepatic negative GI ROS, Neg liver ROS, GERD  Medicated,  Endo/Other  negative endocrine ROS  Renal/GU negative Renal ROS  negative genitourinary   Musculoskeletal  (+) Arthritis , Osteoarthritis,    Abdominal   Peds  Hematology negative hematology ROS (+)   Anesthesia Other Findings   Reproductive/Obstetrics negative OB ROS                           Anesthesia Physical Anesthesia Plan  ASA: III  Anesthesia Plan: General   Post-op Pain Management:    Induction: Intravenous  Airway Management Planned: LMA  Additional Equipment:   Intra-op Plan:   Post-operative Plan:   Informed Consent: I have reviewed the patients History and Physical, chart, labs and discussed the procedure including the risks, benefits and alternatives for the proposed anesthesia with the patient or authorized representative who has indicated his/her understanding and acceptance.   Dental Advisory Given  Plan Discussed with: CRNA and Surgeon  Anesthesia Plan Comments:         Anesthesia Quick Evaluation

## 2015-02-07 NOTE — Transfer of Care (Signed)
Immediate Anesthesia Transfer of Care Note  Patient: Tony Prince  Procedure(s) Performed: Procedure(s) with comments: TRANSURETHRAL RESECTION OF BLADDER TUMOR WITH GYRUS (TURBT-GYRUS) LOOP (N/A) -  [place]  Patient Location: PACU  Anesthesia Type:General  Level of Consciousness: awake, alert , oriented and patient cooperative  Airway & Oxygen Therapy: Patient Spontanous Breathing and Patient connected to nasal cannula oxygen  Post-op Assessment: Report given to RN and Post -op Vital signs reviewed and stable  Post vital signs: Reviewed and stable  Last Vitals:  Filed Vitals:   02/07/15 1405  BP: 148/70  Pulse: 57  Temp: 36.6 C  Resp: 8    Complications: No apparent anesthesia complications

## 2015-02-07 NOTE — H&P (Signed)
Urology History and Physical Exam  CC: bladder cancer  HPI: 81 year old male presents at this time for TURBT of a 3 cm left sidewall bladder tumor, discovered on cystoscopy for hematuria evaluation.  He presents for resection as well as mitomycin administration.  PMH: Past Medical History  Diagnosis Date  . BPH (benign prostatic hyperplasia)   . Psoriasis   . Osteoporosis   . Anxiety   . Hiatal hernia   . Positional vertigo   . Sebaceous cyst     left shoulder  . Allergic rhinitis   . Thrombocytopenia (HCC)     MILD  . History of lung abscess     PULMONARY NECROSIS  S/P  RIGHT UPPER LOBECTOMY  09-14-2012  . Left inguinal hernia   . Bladder cancer (Tony Prince)   . Chronically dry eyes   . COPD (chronic obstructive pulmonary disease) ([place])   . Heart murmur     2014 told that he has murmur by Dr. Felipa Eth //  per echo 2011  no MVP  . Exertional shortness of breath   . GERD (gastroesophageal reflux disease)     no meds  . Lower urinary tract symptoms (LUTS)   . Arthritis     degenerative joints//  neck    PSH: Past Surgical History  Procedure Laterality Date  . Transurethral resection of prostate  1998  . Cataract extraction w/ intraocular lens  implant, bilateral    . Video assisted thoracoscopy (vats)/wedge resection Right 09/14/2012    Procedure: VIDEO ASSISTED THORACOSCOPY (VATS)/WEDGE RESECTION;  Surgeon: Melrose Nakayama, MD;  Location: [place];  Service: Thoracic;  Laterality: Right;  possible chest wall resection  . Lobectomy Right 09/14/2012    Procedure: LOBECTOMY;  Surgeon: Melrose Nakayama, MD;  Location: [place];  Service: Thoracic;  Laterality: Right;  . Thoracotomy/lobectomy Right 09/14/2012    Procedure: THORACOTOMY/LOBECTOMY;  Surgeon: Melrose Nakayama, MD;  Location: [place];  Service: Thoracic;  Laterality: Right;  . Appendectomy  1945  . Cardiovascular stress test  08-31-2012  dr Irish Lack    Essentialy normal perfusion study, low risk/  normal LV  function and wall motion, ef 89%  . Transthoracic echocardiogram  10-10-2009    grade 1 diastolic dysfunction,  ef 71%/  mild TR and PR/  aortic valve sclerotic but opens well/    . Tonsillectomy  as child  . Negative sleep study  2014 per pt    Allergies: No Known Allergies  Medications: Prescriptions prior to admission  Medication Sig Dispense Refill Last Dose  . Calcium Citrate-Vitamin D 315-250 MG-UNIT TABS Take by mouth daily.   Past Week at Unknown time  . Colloidal Oatmeal (AVEENO ECZEMA THERAPY) 1 % CREA Apply 1 application topically daily as needed (eczema).   Past Week at Unknown time  . docusate sodium (COLACE) 100 MG capsule Take 100 mg by mouth daily.   02/06/2015 at Unknown time  . Eyelid Cleansers (SYSTANE LID WIPES EX) Apply 1 application topically every morning. Apply externally to both eyes.   Past Week at Unknown time  . fish oil-omega-3 fatty acids 1000 MG capsule Take 2 g by mouth daily.    Past Week at Unknown time  . fluocinonide cream (LIDEX) 0.05 % Apply to affected area as needed   Past Week at Unknown time  . Hydrocortisone Butyr Lipo Base 0.1 % CREA Apply 1 application topically as needed. psoriasis   Past Week at Unknown time  . LORazepam (ATIVAN) 0.5 MG tablet  Take 0.5 mg by mouth at bedtime.    02/06/2015 at Unknown time  . Polyethyl Glycol-Propyl Glycol (SYSTANE ULTRA) 0.4-0.3 % SOLN Apply 1 drop to eye 2 (two) times daily as needed (dry eyes).   02/07/2015 at 0800  . polyethylene glycol (MIRALAX / GLYCOLAX) packet Take 17 g by mouth daily as needed.    Past Week at Unknown time  . triamcinolone (KENALOG) topical spray Apply topically 2 (two) times a week. 1 application   Past Week at Unknown time     Social History: Social History   Social History  . Marital Status: Widowed    Spouse Name: N/A  . Number of Children: 2  . Years of Education: N/A   Occupational History  . professor phyloscopy and religion     at [employer] History Main  Topics  . Smoking status: Former Smoker -- 10 years    Types: Pipe    Quit date: 03/23/1978  . Smokeless tobacco: Never Used  . Alcohol Use: Yes     Comment: VERY RARE  . Drug Use: No  . Sexual Activity: Not on file   Other Topics Concern  . Not on file   Social History Narrative    Family History: Family History  Problem Relation Age of Onset  . Hyperlipidemia Father   . Hypertension Father   . Diabetes Father   . Heart disease Father   . Colon polyps Mother   . Heart disease Brother     #1  . Heart disease Brother     #2     Review of Systems: Genitourinary: urinary frequency, feelings of urinary urgency, dysuria, nocturia, incontinence, difficulty starting the urinary stream, weak urinary stream, urinary stream starts and stops, hematuria and initiating urination requires straining.  Gastrointestinal: constipation.  Integumentary: skin rash/lesion.  Eyes: blurred vision.  ENT: sinus problems.  Hematologic/Lymphatic: a tendency to easily bruise.  Respiratory: shortness of breath.  Musculoskeletal: joint pain.                 Physical Exam: '@VITALS2'$ @ General: No acute distress.  Awake. Head:  Normocephalic.  Atraumatic. ENT:  EOMI.  Mucous membranes moist Neck:  Supple.  No lymphadenopathy. CV:  S1 present. S2 present. Regular rate. Pulmonary: Equal effort bilaterally.  Clear to auscultation bilaterally. Abdomen: Soft.  Non- tender to palpation. Skin:  Normal turgor.  No visible rash. Extremity: No gross deformity of bilateral upper extremities.  No gross deformity of                             lower extremities. Neurologic: Alert. Appropriate mood.  Epididymis: Palpable bilaterally. Nontender to palpation.  Studies:  Recent Labs     02/07/15  1143  HGB  14.9    No results for input(s): NA, K, CL, CO2, BUN, CREATININE, CALCIUM, GFRNONAA, GFRAA in the last 72 hours.  Invalid input(s): MAGNESIUM   No results for input(s): INR, APTT in the last 72  hours.  Invalid input(s): PT   Invalid input(s): ABG    Assessment:  Bladder cancer, left side wall, 3 cm  Plan: Cystoscopy, TURBT, probable mitomycin administration

## 2015-02-07 NOTE — Anesthesia Procedure Notes (Addendum)
Procedure Name: LMA Insertion Date/Time: 02/07/2015 1:16 PM Performed by: Wanita Chamberlain Pre-anesthesia Checklist: Patient identified, Timeout performed, Emergency Drugs available, Suction available and Patient being monitored Patient Re-evaluated:Patient Re-evaluated prior to inductionOxygen Delivery Method: Circle system utilized Preoxygenation: Pre-oxygenation with 100% oxygen Intubation Type: IV induction Ventilation: Mask ventilation without difficulty LMA: LMA with gastric port inserted LMA Size: 4.0 Number of attempts: 1 Placement Confirmation: positive ETCO2 and breath sounds checked- equal and bilateral Tube secured with: Tape Dental Injury: Teeth and Oropharynx as per pre-operative assessment  Comments: Pt's lips cracked and dry. Insert of LMA pinched lip

## 2015-02-08 ENCOUNTER — Encounter (HOSPITAL_BASED_OUTPATIENT_CLINIC_OR_DEPARTMENT_OTHER): Payer: Self-pay | Admitting: Urology

## 2015-02-08 DIAGNOSIS — C672 Malignant neoplasm of lateral wall of bladder: Secondary | ICD-10-CM | POA: Diagnosis not present

## 2015-02-08 DIAGNOSIS — C679 Malignant neoplasm of bladder, unspecified: Secondary | ICD-10-CM | POA: Diagnosis not present

## 2015-02-08 LAB — BASIC METABOLIC PANEL
ANION GAP: 8 (ref 5–15)
BUN: 25 mg/dL — ABNORMAL HIGH (ref 6–20)
CALCIUM: 9.4 mg/dL (ref 8.9–10.3)
CHLORIDE: 102 mmol/L (ref 101–111)
CO2: 29 mmol/L (ref 22–32)
Creatinine, Ser: 1.08 mg/dL (ref 0.61–1.24)
GFR calc non Af Amer: >60 mL/min (ref >60)
GLUCOSE: 153 mg/dL — AB (ref 65–99)
Potassium: 3.8 mmol/L (ref 3.5–5.1)
SODIUM: 139 mmol/L (ref 135–145)

## 2015-02-08 MED ORDER — OXYBUTYNIN CHLORIDE 5 MG PO TABS: 5.0000 mg | ORAL_TABLET | Freq: Three times a day (TID) | ORAL | Status: DC | PRN

## 2015-02-08 MED ORDER — TRAMADOL HCL 50 MG PO TABS: 50.0000 mg | ORAL_TABLET | Freq: Four times a day (QID) | ORAL | Status: DC | PRN

## 2015-02-08 MED ORDER — SULFAMETHOXAZOLE-TRIMETHOPRIM 800-160 MG PO TABS: 1.0000 | ORAL_TABLET | Freq: Two times a day (BID) | ORAL | Status: DC

## 2015-02-08 MED ORDER — LORAZEPAM 0.5 MG PO TABS
0.5000 mg | ORAL_TABLET | Freq: Every day | ORAL | Status: DC
  Administered 2015-02-08: 0.5 mg via ORAL
  Filled 2015-02-08: qty 1

## 2015-02-08 MED ORDER — HYDROCODONE-ACETAMINOPHEN 5-325 MG PO TABS: 1.0000 | ORAL_TABLET | ORAL | Status: DC | PRN

## 2015-02-08 NOTE — Progress Notes (Signed)
PHARMACY NOTE -  antibiotic renal dose adjustment  Pharmacy has been assisting with dosing of Bactrim s/p Cystoscopy, TURBT on 11/17.  Dosage remains stable at Bactrim DS 1 tab PO BID and need for further dosage adjustment appears unlikely at present.    Pharmacy will sign off at this time.  Please reconsult if a change in clinical status warrants re-evaluation of dosage.  Gretta Arab PharmD, BCPS Pager [phone] 02/08/2015 10:42 AM

## 2015-02-08 NOTE — Progress Notes (Signed)
02/08/15  1310  Reviewed discharge instructions with patient. Patient verbalized understanding of discharge instructions. Copy of discharge instructions and prescription given to patient.

## 2015-02-08 NOTE — Anesthesia Postprocedure Evaluation (Signed)
  Anesthesia Post-op Note  Patient: Tony Prince  Procedure(s) Performed: Procedure(s) (LRB): TRANSURETHRAL RESECTION OF BLADDER TUMOR WITH GYRUS (TURBT-GYRUS) LOOP (N/A)  Patient Location: PACU  Anesthesia Type: General  Level of Consciousness: awake and alert   Airway and Oxygen Therapy: Patient Spontanous Breathing  Post-op Pain: mild  Post-op Assessment: Post-op Vital signs reviewed, Patient's Cardiovascular Status Stable, Respiratory Function Stable, Patent Airway and No signs of Nausea or vomiting  Last Vitals:  Filed Vitals:   02/08/15 0835  BP: 120/59  Pulse: 49  Temp: 36.6 C  Resp: 16    Post-op Vital Signs: stable   Complications: No apparent anesthesia complications

## 2015-02-08 NOTE — Discharge Summary (Signed)
Patient ID: ROSEVELT LUU MRN: [id number] DOB/AGE: [birth year] 79 y.o.  Admit date: 02/07/2015 Discharge date: 02/08/2015  Primary Care Physician:  Mathews Argyle, MD  Discharge Diagnoses:   Present on Admission:  . Malignant neoplasm of lateral wall of bladder (HCC)  Consults:  None     Discharge Medications:   Medication List    ASK your doctor about these medications        AVEENO ECZEMA THERAPY 1 % Crea  Generic drug:  Colloidal Oatmeal  Apply 1 application topically daily as needed (eczema).     bisacodyl 10 MG suppository  Commonly known as:  DULCOLAX  Place 10 mg rectally as needed for moderate constipation.     Calcium Citrate-Vitamin D 315-250 MG-UNIT Tabs  Take by mouth daily.     docusate sodium 100 MG capsule  Commonly known as:  COLACE  Take 300 mg by mouth daily as needed for mild constipation.     fish oil-omega-3 fatty acids 1000 MG capsule  Take 1 g by mouth daily.     fluocinonide cream 0.05 %  Commonly known as:  LIDEX  Apply to affected area as needed     FLUZONE HIGH-DOSE 0.5 ML Susy  Generic drug:  Influenza Vac Split High-Dose  Inject 1 Dose into the muscle once.     Hydrocortisone Butyr Lipo Base 0.1 % Crea  Apply 1 application topically as needed. psoriasis     LORazepam 0.5 MG tablet  Commonly known as:  ATIVAN  Take 0.5 mg by mouth at bedtime.     polyethylene glycol packet  Commonly known as:  MIRALAX / GLYCOLAX  Take 17 g by mouth daily as needed for mild constipation.     SYSTANE ULTRA 0.4-0.3 % Soln  Generic drug:  Polyethyl Glycol-Propyl Glycol  Apply 1 drop to eye 2 (two) times daily as needed (dry eyes).     triamcinolone 0.147 MG/GM topical spray  Commonly known as:  KENALOG  Apply topically 2 (two) times a week. 1 application         Significant Diagnostic Studies:  No results found.  Brief H and P: For complete details please refer to admission H and P, but in brief the patient was admitted for  endoscopic management of a bladder tumor  Hospital Course:  Active Problems:   Malignant neoplasm of lateral wall of bladder (HCC)  following TURBT, the patient was admitted for overnight observation. He did have bladder spasms the night of surgery, but eventually these quieted down. He was left on CBI which was discontinued and the urine remained fairly clear. He was discharged on postoperative day #1.   Day o f Discharge BP 136/56 mmHg  Pulse 47  Temp(Src) 97.9 F (36.6 C) (Oral)  Resp 18  Ht '5\' 11"'$  (1.803 m)  Wt 57.153 kg (126 lb)  BMI 17.58 kg/m2  SpO2 99%  Results for orders placed or performed during the hospital encounter of 02/07/15 (from the past 24 hour(s))  Hemoglobin-hemacue, POC     Status: None   Collection Time: 02/07/15 11:43 AM  Result Value Ref Range   Hemoglobin 14.9 13.0 - 17.0 g/dL    Physical Exam: General: Alert and awake oriented x3 not in any acute distress. HEENT: anicteric sclera, pupils reactive to light and accommodation CVS: S1-S2 clear no murmur rubs or gallops Chest: clear to auscultation bilaterally, no wheezing rales or rhonchi Abdomen: soft nontender, nondistended, normal bowel sounds, no organomegaly Extremities: no cyanosis, clubbing or edema  noted bilaterally Neuro: Cranial nerves II-XII intact, no focal neurological defici tdisposition: Home Diet:  No restrictions ActivitGradually increase  Disposition and Follow-up:  I will follow him up early next week for voiding trial TESTS THAT NEED FOLLOW-UPPATHOLOGY REVIEW   DISCHARGE FOLLOW-UP     Follow-up Information    Follow up with Jorja Loa, MD.   Specialty:  Urology   Why:  12/9 @ 10 am   Contact information:   [place] [address] [phone]       Time spent on Discharge: 15 minutes: Jorja Loa 02/08/2015, 6:05 AM

## 2015-02-11 DIAGNOSIS — C672 Malignant neoplasm of lateral wall of bladder: Secondary | ICD-10-CM | POA: Diagnosis not present

## 2015-02-12 DIAGNOSIS — R39198 Other difficulties with micturition: Secondary | ICD-10-CM | POA: Diagnosis not present

## 2015-02-21 DIAGNOSIS — R39198 Other difficulties with micturition: Secondary | ICD-10-CM | POA: Diagnosis not present

## 2015-02-21 DIAGNOSIS — C672 Malignant neoplasm of lateral wall of bladder: Secondary | ICD-10-CM | POA: Diagnosis not present

## 2015-03-04 DIAGNOSIS — C689 Malignant neoplasm of urinary organ, unspecified: Secondary | ICD-10-CM | POA: Diagnosis not present

## 2015-03-08 DIAGNOSIS — C679 Malignant neoplasm of bladder, unspecified: Secondary | ICD-10-CM | POA: Diagnosis not present

## 2015-03-14 ENCOUNTER — Other Ambulatory Visit: Payer: Self-pay | Admitting: Urology

## 2015-03-29 ENCOUNTER — Encounter (HOSPITAL_BASED_OUTPATIENT_CLINIC_OR_DEPARTMENT_OTHER): Payer: Self-pay | Admitting: *Deleted

## 2015-03-29 NOTE — Anesthesia Preprocedure Evaluation (Addendum)
Anesthesia Evaluation  Patient identified by MRN, date of birth, ID band Patient awake    Reviewed: Allergy & Precautions, H&P , NPO status , Patient's Chart, lab work & pertinent test results  Airway Mallampati: II  TM Distance: >3 FB Neck ROM: full    Dental no notable dental hx. (+) Dental Advisory Given, Teeth Intact, Poor Dentition,    Pulmonary shortness of breath and with exertion, COPD, former smoker (quit 1980),  SP RUL Lobectomy 2014, FVC 2015 3.1,   Pulmonary exam normal breath sounds clear to auscultation       Cardiovascular Exercise Tolerance: Good + CAD  Normal cardiovascular exam+ dysrhythmias + Valvular Problems/Murmurs MVP  Rhythm:regular Rate:Normal  Stress test 6/14 - pt reports WNL. Chronic bradycardia   Neuro/Psych  Headaches, PSYCHIATRIC DISORDERS Anxiety negative neurological ROS  negative psych ROS   GI/Hepatic negative GI ROS, Neg liver ROS, hiatal hernia, GERD  Medicated,  Endo/Other  negative endocrine ROS  Renal/GU negative Renal ROS  negative genitourinary   Musculoskeletal  (+) Arthritis , Osteoarthritis,    Abdominal   Peds  Hematology negative hematology ROS (+) Hbg 14 01/2015   Anesthesia Other Findings   Reproductive/Obstetrics negative OB ROS                         Anesthesia Physical  Anesthesia Plan  ASA: III  Anesthesia Plan: General   Post-op Pain Management:    Induction: Intravenous  Airway Management Planned: LMA  Additional Equipment:   Intra-op Plan:   Post-operative Plan:   Informed Consent: I have reviewed the patients History and Physical, chart, labs and discussed the procedure including the risks, benefits and alternatives for the proposed anesthesia with the patient or authorized representative who has indicated his/her understanding and acceptance.   Dental Advisory Given  Plan Discussed with: CRNA and Surgeon  Anesthesia  Plan Comments: (LMA 4 last procedure)       Anesthesia Quick Evaluation

## 2015-03-29 NOTE — Progress Notes (Signed)
NPO AFTER MN.  ARRIVE AT 0800. NEEDS HG.  CURRENT CXR IN CHART AND EPIC.  WILL TAKE NEXIUM AM DOS W/ SIPS OF WATER.

## 2015-04-04 ENCOUNTER — Ambulatory Visit (HOSPITAL_BASED_OUTPATIENT_CLINIC_OR_DEPARTMENT_OTHER): Payer: Medicare Other | Admitting: Anesthesiology

## 2015-04-04 ENCOUNTER — Encounter (HOSPITAL_BASED_OUTPATIENT_CLINIC_OR_DEPARTMENT_OTHER): Payer: Self-pay | Admitting: *Deleted

## 2015-04-04 ENCOUNTER — Encounter (HOSPITAL_BASED_OUTPATIENT_CLINIC_OR_DEPARTMENT_OTHER)
Admission: RE | Disposition: A | Discharge status: Home / Self Care | Payer: Self-pay | Source: Ambulatory Visit | Attending: Urology

## 2015-04-04 ENCOUNTER — Ambulatory Visit (HOSPITAL_BASED_OUTPATIENT_CLINIC_OR_DEPARTMENT_OTHER)
Admission: RE | Admit: 2015-04-04 | Discharge: 2015-04-04 | Disposition: A | Discharge status: Home / Self Care | Payer: Medicare Other | Source: Ambulatory Visit | Attending: Urology | Admitting: Urology

## 2015-04-04 DIAGNOSIS — Z79899 Other long term (current) drug therapy: Secondary | ICD-10-CM | POA: Diagnosis not present

## 2015-04-04 DIAGNOSIS — K219 Gastro-esophageal reflux disease without esophagitis: Secondary | ICD-10-CM | POA: Insufficient documentation

## 2015-04-04 DIAGNOSIS — L409 Psoriasis, unspecified: Secondary | ICD-10-CM | POA: Insufficient documentation

## 2015-04-04 DIAGNOSIS — N400 Enlarged prostate without lower urinary tract symptoms: Secondary | ICD-10-CM | POA: Diagnosis not present

## 2015-04-04 DIAGNOSIS — M47812 Spondylosis without myelopathy or radiculopathy, cervical region: Secondary | ICD-10-CM | POA: Diagnosis not present

## 2015-04-04 DIAGNOSIS — Z902 Acquired absence of lung [part of]: Secondary | ICD-10-CM | POA: Insufficient documentation

## 2015-04-04 DIAGNOSIS — J449 Chronic obstructive pulmonary disease, unspecified: Secondary | ICD-10-CM | POA: Insufficient documentation

## 2015-04-04 DIAGNOSIS — M810 Age-related osteoporosis without current pathological fracture: Secondary | ICD-10-CM | POA: Diagnosis not present

## 2015-04-04 DIAGNOSIS — K449 Diaphragmatic hernia without obstruction or gangrene: Secondary | ICD-10-CM | POA: Insufficient documentation

## 2015-04-04 DIAGNOSIS — D090 Carcinoma in situ of bladder: Secondary | ICD-10-CM | POA: Insufficient documentation

## 2015-04-04 DIAGNOSIS — C672 Malignant neoplasm of lateral wall of bladder: Secondary | ICD-10-CM

## 2015-04-04 DIAGNOSIS — I341 Nonrheumatic mitral (valve) prolapse: Secondary | ICD-10-CM | POA: Insufficient documentation

## 2015-04-04 DIAGNOSIS — I2510 Atherosclerotic heart disease of native coronary artery without angina pectoris: Secondary | ICD-10-CM | POA: Insufficient documentation

## 2015-04-04 DIAGNOSIS — C679 Malignant neoplasm of bladder, unspecified: Secondary | ICD-10-CM | POA: Diagnosis present

## 2015-04-04 DIAGNOSIS — Z87891 Personal history of nicotine dependence: Secondary | ICD-10-CM | POA: Diagnosis not present

## 2015-04-04 DIAGNOSIS — R001 Bradycardia, unspecified: Secondary | ICD-10-CM | POA: Insufficient documentation

## 2015-04-04 HISTORY — PX: TRANSURETHRAL RESECTION OF BLADDER TUMOR: SHX2575

## 2015-04-04 LAB — POCT HEMOGLOBIN-HEMACUE: Hemoglobin: 13.9 g/dL (ref 13.0–17.0)

## 2015-04-04 SURGERY — TURBT (TRANSURETHRAL RESECTION OF BLADDER TUMOR)
Anesthesia: General

## 2015-04-04 MED ORDER — HYDROCODONE-ACETAMINOPHEN 5-325 MG PO TABS: 1.0000 | ORAL_TABLET | Freq: Four times a day (QID) | ORAL | Status: DC | PRN

## 2015-04-04 MED ORDER — CEFAZOLIN SODIUM 1-5 GM-% IV SOLN
1.0000 g | INTRAVENOUS | Status: DC
  Filled 2015-04-04: qty 50

## 2015-04-04 MED ORDER — FENTANYL CITRATE (PF) 100 MCG/2ML IJ SOLN
INTRAMUSCULAR | Status: AC
  Filled 2015-04-04: qty 2

## 2015-04-04 MED ORDER — HYDROCODONE-ACETAMINOPHEN 5-325 MG PO TABS
ORAL_TABLET | ORAL | Status: AC
  Filled 2015-04-04: qty 1

## 2015-04-04 MED ORDER — CEFAZOLIN SODIUM-DEXTROSE 2-3 GM-% IV SOLR
2.0000 g | INTRAVENOUS | Status: AC
  Administered 2015-04-04: 2 g via INTRAVENOUS
  Filled 2015-04-04: qty 50

## 2015-04-04 MED ORDER — EPHEDRINE SULFATE 50 MG/ML IJ SOLN
INTRAMUSCULAR | Status: DC | PRN
  Administered 2015-04-04: 10 mg via INTRAVENOUS
  Administered 2015-04-04: 15 mg via INTRAVENOUS
  Administered 2015-04-04: 10 mg via INTRAVENOUS

## 2015-04-04 MED ORDER — DEXAMETHASONE SODIUM PHOSPHATE 4 MG/ML IJ SOLN
INTRAMUSCULAR | Status: DC | PRN
  Administered 2015-04-04: 10 mg via INTRAVENOUS

## 2015-04-04 MED ORDER — LIDOCAINE HCL (CARDIAC) 20 MG/ML IV SOLN
INTRAVENOUS | Status: DC | PRN
  Administered 2015-04-04: 50 mg via INTRAVENOUS

## 2015-04-04 MED ORDER — FENTANYL CITRATE (PF) 100 MCG/2ML IJ SOLN
25.0000 ug | INTRAMUSCULAR | Status: DC | PRN
  Administered 2015-04-04 (×2): 50 ug via INTRAVENOUS
  Filled 2015-04-04: qty 1

## 2015-04-04 MED ORDER — FENTANYL CITRATE (PF) 100 MCG/2ML IJ SOLN
INTRAMUSCULAR | Status: DC | PRN
  Administered 2015-04-04 (×2): 50 ug via INTRAVENOUS

## 2015-04-04 MED ORDER — ONDANSETRON HCL 4 MG/2ML IJ SOLN
INTRAMUSCULAR | Status: DC | PRN
  Administered 2015-04-04: 4 mg via INTRAVENOUS

## 2015-04-04 MED ORDER — STERILE WATER FOR IRRIGATION IR SOLN
Status: DC | PRN
  Administered 2015-04-04 (×3): 3000 mL

## 2015-04-04 MED ORDER — LACTATED RINGERS IV SOLN
INTRAVENOUS | Status: DC
  Administered 2015-04-04 (×3): via INTRAVENOUS
  Filled 2015-04-04: qty 1000

## 2015-04-04 MED ORDER — HYDROCODONE-ACETAMINOPHEN 5-325 MG PO TABS
1.0000 | ORAL_TABLET | Freq: Once | ORAL | Status: AC
  Administered 2015-04-04: 1 via ORAL
  Filled 2015-04-04: qty 1

## 2015-04-04 MED ORDER — SULFAMETHOXAZOLE-TRIMETHOPRIM 800-160 MG PO TABS: 1.0000 | ORAL_TABLET | Freq: Two times a day (BID) | ORAL | Status: DC

## 2015-04-04 MED ORDER — PROPOFOL 10 MG/ML IV BOLUS
INTRAVENOUS | Status: DC | PRN
  Administered 2015-04-04: 150 mg via INTRAVENOUS
  Administered 2015-04-04: 50 mg via INTRAVENOUS

## 2015-04-04 SURGICAL SUPPLY — 32 items
BAG DRAIN URO-CYSTO SKYTR STRL (DRAIN) ×3 IMPLANT
BAG URINE DRAINAGE (UROLOGICAL SUPPLIES) IMPLANT
BAG URINE LEG 19OZ MD ST LTX (BAG) ×3 IMPLANT
CANISTER SUCT LVC 12 LTR MEDI- (MISCELLANEOUS) IMPLANT
CATH FOLEY 2WAY SLVR  5CC 20FR (CATHETERS) ×2
CATH FOLEY 2WAY SLVR  5CC 22FR (CATHETERS)
CATH FOLEY 2WAY SLVR 5CC 20FR (CATHETERS) ×1 IMPLANT
CATH FOLEY 2WAY SLVR 5CC 22FR (CATHETERS) IMPLANT
CATH FOLEY 3WAY 30CC 22F (CATHETERS) IMPLANT
CLOTH BEACON ORANGE TIMEOUT ST (SAFETY) ×3 IMPLANT
ELECT BUTTON BIOP 24F 90D PLAS (MISCELLANEOUS) IMPLANT
ELECT REM PT RETURN 9FT ADLT (ELECTROSURGICAL) ×3
ELECTRODE REM PT RTRN 9FT ADLT (ELECTROSURGICAL) ×1 IMPLANT
EVACUATOR MICROVAS BLADDER (UROLOGICAL SUPPLIES) IMPLANT
GLOVE BIO SURGEON STRL SZ8 (GLOVE) ×3 IMPLANT
GLOVE BIOGEL PI IND STRL 6.5 (GLOVE) ×1 IMPLANT
GLOVE BIOGEL PI INDICATOR 6.5 (GLOVE) ×2
GLOVE SURG SS PI 6.5 STRL IVOR (GLOVE) ×6 IMPLANT
GLOVE SURG SS PI 7.5 STRL IVOR (GLOVE) ×6 IMPLANT
GOWN STRL REUS W/ TWL XL LVL3 (GOWN DISPOSABLE) ×1 IMPLANT
GOWN STRL REUS W/TWL XL LVL3 (GOWN DISPOSABLE) ×2
GOWN XL W/COTTON TOWEL STD (GOWNS) ×3 IMPLANT
HOLDER FOLEY CATH W/STRAP (MISCELLANEOUS) ×3 IMPLANT
KIT ROOM TURNOVER WOR (KITS) ×3 IMPLANT
MANIFOLD NEPTUNE II (INSTRUMENTS) ×3 IMPLANT
PACK CYSTO (CUSTOM PROCEDURE TRAY) ×3 IMPLANT
PLUG CATH AND CAP STER (CATHETERS) IMPLANT
SET ASPIRATION TUBING (TUBING) IMPLANT
SYRINGE IRR TOOMEY STRL 70CC (SYRINGE) IMPLANT
TUBE CONNECTING 12'X1/4 (SUCTIONS) ×1
TUBE CONNECTING 12X1/4 (SUCTIONS) ×2 IMPLANT
WATER STERILE IRR 3000ML UROMA (IV SOLUTION) ×9 IMPLANT

## 2015-04-04 NOTE — Transfer of Care (Signed)
Immediate Anesthesia Transfer of Care Note  Patient: Tony Prince  Procedure(s) Performed: Procedure(s): CYSTOSCOPY WITH BLADDER TUMOR BIOPSIES (N/A)  Patient Location: PACU  Anesthesia Type:General  Level of Consciousness: awake, alert , oriented and patient cooperative  Airway & Oxygen Therapy: Patient Spontanous Breathing and Patient connected to nasal cannula oxygen  Post-op Assessment: Report given to RN and Post -op Vital signs reviewed and stable  Post vital signs: Reviewed and stable  Last Vitals:  Filed Vitals:   04/04/15 0828  BP: 159/81  Pulse: 50  Temp: 36.4 C  Resp: 20    Complications: No apparent anesthesia complications

## 2015-04-04 NOTE — Progress Notes (Signed)
A call to surgery to ask many questions from the patient..   Information from surgeon given to patient and his som.Marland Kitchen

## 2015-04-04 NOTE — Op Note (Signed)
PATIENT:  Tony Prince  PRE-OPERATIVE DIAGNOSIS: High-grade, stage TI bladder cancer  POST-OPERATIVE DIAGNOSIS: Same  PROCEDURE: Cystoscopy, bladder biopsies  SURGEON:  Lillette Boxer. Jacqueline Spofford, M.D.  ANESTHESIA:  General  EBL:  Minimal  DRAINS: 34 French Foley catheter to leg bag  LOCAL MEDICATIONS USED:  None  SPECIMEN:  Bladder tumor site biopsies  INDICATION: MAVERIK FOOT is an 82 year old male presents this time for repeat TURBT/bladder biopsy. The patient has a history of high-grade non-muscle invasive bladder cancer, and is status post resection in late 2016. He was found to have lamina propria but no muscle invasion in his specimen. He presents at this time for cystoscopy and confirmatory/repeat biopsy. He is aware of the risks and complications of the procedure. He did experience postoperative retention and some hematuria after his last procedure. Because of this, I told him that more than likely he would retain his catheter for at least a week afterwards.  Description of procedure: The patient was properly identified in the holding area. He received preoperative IV antibiotics. They were then  taken to the operating room and placed on the table in a supine position. General anesthesia was then administered. Once fully anesthetized the patient was moved to the dorsolithotomy position and the genitalia and perineum were sterilely prepped and draped in standard fashion. An official timeout was then performed.  The 15 French obturator was passed with the Optiview device. The urethra was normal, free of stricture or lesions. Prostatic urethra was wide open, consistent with prior TUR resection. There were no lesions within the prostatic urethra. The bladder was then inspected circumferentially. There were multiple 2+ trabeculations. Ureteral orifices were normal in configuration and location. Mild prominence of vasculature. No papillary lesions were noted. A 17 mm x 10 mm area of  fibrinous tissue was located in the left anterior bladder consistent with prior resection site. I saw no obvious tumor in this spot. The bladder wall looked quite thin, due to its prior obstructed condition, and especially the prior bladder tumor site. Because of this, I did not use the resectoscope and the cutting loop to remove the prior tumor site. I used the cold cup biopsy forceps, and in approximately 5-7 biopsies, totally excised the prior biopsy site. These biopsies were sent as "prior bladder tumor site". It was obvious that there were some small perforations, as a very small amount of fat was seen through the biopsied sites. Following completion of this biopsy, I did not see any remaining abnormal tissue. The Bugbee electrode was used to totally cauterize the biopsy site, as well as the periphery of the biopsy site, for a proximally 3-4 mm circumferentially. At this point, with the irrigation turned off, there was no evidence of any bleeding. The bladder neck had a few small bleeders, these were controlled with the Bugbee electrode. At this point, with there being no further bleeding, and the prior bladder tumor site being excised, the procedure was terminated. The scope was removed. I placed a 20 French Foley catheter, the balloon was filled with 10 mL of water.  The patient was then awakened and taken to the PACU in stable condition. He tolerated the procedure well.

## 2015-04-04 NOTE — Discharge Instructions (Addendum)
1. You may see some blood in the urine and may have some burning with urination for 48-72 hours. You also may notice that you have to urinate more frequently or urgently after your procedure which is normal.  2. You should call should you develop an inability urinate, fever > 101, persistent nausea and vomiting that prevents you from eating or drinking to stay hydrated.  If you have a catheter, you will be taught how to take care of the catheter by the nursing staff prior to discharge from the hospital.  You may periodically feel a strong urge to void with the catheter in place.  This is a bladder spasm and most often can occur when having a bowel movement or moving around. It is typically self-limited and usually will stop after a few minutes.  You may use some Vaseline or Neosporin around the tip of the catheter to reduce friction at the tip of the penis. You may also see some blood in the urine.  A very small amount of blood can make the urine look quite red.  As long as the catheter is draining well, there usually is not a problem.  However, if the catheter is not draining well and is bloody, you should call the office [phone]) to notify us. It will be best to leave your catheter in until Monday, the 23rd.Foley Catheter Care  A Foley catheter is a soft, flexible tube that is placed into the bladder to drain urine. A Foley catheter may be inserted if: You leak urine or are not able to control when you urinate (urinary incontinence). You are not able to urinate when you need to (urinary retention). You had prostate surgery or surgery on the genitals. You have certain medical conditions, such as multiple sclerosis, dementia, or a spinal cord injury. If you are going home with a Foley catheter in place, follow the instructions below. TAKING CARE OF THE CATHETER Wash your hands with soap and water. Using mild soap and warm water on a clean washcloth: Clean the area on your body closest to the  catheter insertion site using a circular motion, moving away from the catheter. Never wipe toward the catheter because this could sweep bacteria up into the urethra and cause infection. Remove all traces of soap. Pat the area dry with a clean towel. For males, reposition the foreskin. Attach the catheter to your leg so there is no tension on the catheter. Use adhesive tape or a leg strap. If you are using adhesive tape, remove any sticky residue left behind by the previous tape you used. Keep the drainage bag below the level of the bladder, but keep it off the floor. Check throughout the day to be sure the catheter is working and urine is draining freely. Make sure the tubing does not become kinked. Do not pull on the catheter or try to remove it. Pulling could damage internal tissues. TAKING CARE OF THE DRAINAGE BAGS You will be given two drainage bags to take home. One is a large overnight drainage bag, and the other is a smaller leg bag that fits underneath clothing. You may wear the overnight bag at any time, but you should never wear the smaller leg bag at night. Follow the instructions below for how to empty, change, and clean your drainage bags. Emptying the Drainage Bag You must empty your drainage bag when it is  - full or at least 2-3 times a day. Wash your hands with soap and water. Keep  the drainage bag below your hips, below the level of your bladder. This stops urine from going back into the tubing and into your bladder. Hold the dirty bag over the toilet or a clean container. Open the pour spout at the bottom of the bag and empty the urine into the toilet or container. Do not let the pour spout touch the toilet, container, or any other surface. Doing so can place bacteria on the bag, which can cause an infection. Clean the pour spout with a gauze pad or cotton ball that has rubbing alcohol on it. Close the pour spout. Attach the bag to your leg with  a leg strap. Wash your hands  well. Changing the Drainage Bag Change your drainage bag once a month or sooner if it starts to smell bad or look dirty. Below are steps to follow when changing the drainage bag. Wash your hands with soap and water. Pinch off the rubber catheter so that urine does not spill out. Disconnect the catheter tube from the drainage tube at the connection valve. Do not let the tubes touch any surface. Clean the end of the catheter tube with an alcohol wipe. Use a different alcohol wipe to clean the end of the drainage tube. Connect the catheter tube to the drainage tube of the clean drainage bag. Attach the new bag to the leg with adhesive tape or a leg strap. Avoid attaching the new bag too tightly. Wash your hands well. Cleaning the Drainage Bag Wash your hands with soap and water. Wash the bag in warm, soapy water. Rinse the bag thoroughly with warm water. Fill the bag with a solution of white vinegar and water (1 cup vinegar to 1 qt warm water [.2 L vinegar to 1 L warm water]). Close the bag and soak it for 30 minutes in the solution. Rinse the bag with warm water. Hang the bag to dry with the pour spout open and hanging downward. Store the clean bag (once it is dry) in a clean plastic bag. Wash your hands well. PREVENTING INFECTION Wash your hands before and after handling your catheter. Take showers daily and wash the area where the catheter enters your body. Do not take baths. Replace wet leg straps with dry ones, if this applies. Do not use powders, sprays, or lotions on the genital area. Only use creams, lotions, or ointments as directed by your caregiver. For females, wipe from front to back after each bowel movement. Drink enough fluids to keep your urine clear or pale yellow unless you have a fluid restriction. Do not let the drainage bag or tubing touch or lie on the floor. Wear cotton underwear to absorb moisture and to keep your skin drier. SEEK MEDICAL CARE IF:  Your urine is  cloudy or smells unusually bad. Your catheter becomes clogged. You are not draining urine into the bag or your bladder feels full. Your catheter starts to leak. SEEK IMMEDIATE MEDICAL CARE IF:  You have pain, swelling, redness, or pus where the catheter enters the body. You have pain in the abdomen, legs, lower back, or bladder. You have a fever. You see blood fill the catheter, or your urine is pink or red. You have nausea, vomiting, or chills. Your catheter gets pulled out. MAKE SURE YOU:  Understand these instructions. Will watch your condition. Will get help right away if you are not doing well or get worse.   This information is not intended to replace advice given to you by your  health care provider. Make sure you discuss any questions you have with your health care provider.   Document Released: 03/09/2005 Document Revised: 07/24/2013 Document Reviewed: 02/29/2012  Post Anesthesia Home Care Instructions  Activity: Get plenty of rest for the remainder of the day. A responsible adult should stay with you for 24 hours following the procedure.  For the next 24 hours, DO NOT: -Drive a car -Paediatric nurse -Drink alcoholic beverages -Take any medication unless instructed by your physician -Make any legal decisions or sign important papers.  Meals: Start with liquid foods such as gelatin or soup. Progress to regular foods as tolerated. Avoid greasy, spicy, heavy foods. If nausea and/or vomiting occur, drink only clear liquids until the nausea and/or vomiting subsides. Call your physician if vomiting continues.  Special Instructions/Symptoms: Your throat may feel dry or sore from the anesthesia or the breathing tube placed in your throat during surgery. If this causes discomfort, gargle with warm salt water. The discomfort should disappear within 24 hours.  If you had a scopolamine patch placed behind your ear for the management of post- operative nausea and/or vomiting:  1.  The medication in the patch is effective for 72 hours, after which it should be removed.  Wrap patch in a tissue and discard in the trash. Wash hands thoroughly with soap and water. 2. You may remove the patch earlier than 72 hours if you experience unpleasant side effects which may include dry mouth, dizziness or visual disturbances. 3. Avoid touching the patch. Wash your hands with soap and water after contact with the patch.

## 2015-04-04 NOTE — Anesthesia Postprocedure Evaluation (Signed)
Anesthesia Post Note  Patient: Tony Prince  Procedure(s) Performed: Procedure(s) (LRB): CYSTOSCOPY WITH BLADDER TUMOR BIOPSIES (N/A)  Patient location during evaluation: PACU Anesthesia Type: General Level of consciousness: awake and alert Pain management: pain level controlled Vital Signs Assessment: post-procedure vital signs reviewed and stable Respiratory status: spontaneous breathing, nonlabored ventilation, respiratory function stable and patient connected to nasal cannula oxygen Cardiovascular status: blood pressure returned to baseline and stable Postop Assessment: no signs of nausea or vomiting Anesthetic complications: no    Last Vitals:  Filed Vitals:   04/04/15 1025 04/04/15 1040  BP: 164/83   Pulse: 64 59  Temp:  35.7 C  Resp: 20 19    Last Pain:  Filed Vitals:   04/04/15 1043  PainSc: 8                  [name]

## 2015-04-04 NOTE — H&P (Signed)
Urology History and Physical Exam  CC: Bladder cancer  HPI: 82 year old male presents for repeat cysto, reresection of bladder for h/o high grade NMIBC.    This man underwent TURBT of a 3 cm bladder tumor on 02/07/2015. Pathology revealed stage TI, high-grade. Muscle obtained in the biopsy and was not involved with cancer.  Because of the large defect, he was not administered mitomycin postoperatively. He was left with the catheter for 3 days but had problems voiding afterwards and was on, for a short period of time, self intermittent catheterization. He had gross hematuria with clots last week. This has mostly resolved. He has had a slow stream, but has been feeling that his bladder empties little bit better now than last week. He does have nocturia 3-4. He is having no fever or chills. He is having no suprapubic pain.  He has been doing better over the past 2-3 weeks. He did seek second opinion from [facility]. They agreed with the overall pathology review, although they could not definitively say that there was not muscular invasion in the tumor specimen. They did suggest the patient have a second procedure, previously recommended by me.  He presents for that procedure following extensive discussions.   PMH: Past Medical History  Diagnosis Date  . BPH (benign prostatic hyperplasia)   . Psoriasis   . Osteoporosis   . Anxiety   . Hiatal hernia   . Positional vertigo   . Sebaceous cyst     left shoulder  . Allergic rhinitis   . Thrombocytopenia (HCC)     MILD  . History of lung abscess     PULMONARY NECROSIS  S/P  RIGHT UPPER LOBECTOMY  09-14-2012  . Left inguinal hernia   . Chronically dry eyes   . COPD (chronic obstructive pulmonary disease) ([place])   . Heart murmur     2014 told that he has murmur by Dr. Felipa Eth //  per echo 2011  no MVP  . Exertional shortness of breath   . GERD (gastroesophageal reflux disease)     no meds  . Lower  urinary tract symptoms (LUTS)   . Arthritis     degenerative joints//  neck  . Bladder cancer [facility]) urologist-  dr Lashonta Pilling    invasive high grade carcinoma  per pathology 02-07-2015    PSH: Past Surgical History  Procedure Laterality Date  . Transurethral resection of prostate  1998  . Cataract extraction w/ intraocular lens  implant, bilateral    . Video assisted thoracoscopy (vats)/wedge resection Right 09/14/2012    Procedure: VIDEO ASSISTED THORACOSCOPY (VATS)/WEDGE RESECTION;  Surgeon: Melrose Nakayama, MD;  Location: [place];  Service: Thoracic;  Laterality: Right;  possible chest wall resection  . Lobectomy Right 09/14/2012    Procedure: LOBECTOMY;  Surgeon: Melrose Nakayama, MD;  Location: [place];  Service: Thoracic;  Laterality: Right;  . Thoracotomy/lobectomy Right 09/14/2012    Procedure: THORACOTOMY/LOBECTOMY;  Surgeon: Melrose Nakayama, MD;  Location: [place];  Service: Thoracic;  Laterality: Right;  . Appendectomy  1945  . Cardiovascular stress test  08-31-2012  dr Irish Lack    Essentialy normal perfusion study, low risk/  normal LV function and wall motion, ef 89%  . Transthoracic echocardiogram  10-10-2009    grade 1 diastolic dysfunction,  ef 71%/  mild TR and PR/  aortic valve sclerotic but opens well/    . Tonsillectomy  as child  . Negative sleep study  2014  per pt  . Transurethral resection of bladder tumor with gyrus (turbt-gyrus) N/A 02/07/2015    Procedure: TRANSURETHRAL RESECTION OF BLADDER TUMOR WITH GYRUS (TURBT-GYRUS) LOOP;  Surgeon: Franchot Gallo, MD;  Location: [facility];  Service: Urology;  Laterality: N/A;    Allergies: No Known Allergies  Medications: No prescriptions prior to admission     Social History: Social History   Social History  . Marital Status: Widowed    Spouse Name: N/A  . Number of Children: 2  . Years of Education: N/A   Occupational History  . professor phyloscopy and religion     at [employer] History Main Topics  . Smoking status: Former Smoker -- 10 years    Types: Pipe    Quit date: 03/23/1978  . Smokeless tobacco: Never Used  . Alcohol Use: Yes     Comment: VERY RARE  . Drug Use: No  . Sexual Activity: Not on file   Other Topics Concern  . Not on file   Social History Narrative    Family History: Family History  Problem Relation Age of Onset  . Hyperlipidemia Father   . Hypertension Father   . Diabetes Father   . Heart disease Father   . Colon polyps Mother   . Heart disease Brother     #1  . Heart disease Brother     #2     Review of Systems: Positive: Slow stream, dysuria Negative:   A further 10 point review of systems was negative except what is listed in the HPI.                  Physical Exam: '@VITALS2'$ @ General: No acute distress.  Awake. Head:  Normocephalic.  Atraumatic. ENT:  EOMI.  Mucous membranes moist Neck:  Supple.  No lymphadenopathy. CV:  S1 present. S2 present. Regular rate. Pulmonary: Equal effort bilaterally.  Clear to auscultation bilaterally. Abdomen: Soft.  Non tender to palpation. Skin:  Normal turgor.  No visible rash. Extremity: No gross deformity of bilateral upper extremities.  No gross deformity of                             lower extremities. Neurologic: Alert. Appropriate mood.   Studies:  No results for input(s): HGB, WBC, PLT in the last 72 hours.  No results for input(s): NA, K, CL, CO2, BUN, CREATININE, CALCIUM, GFRNONAA, GFRAA in the last 72 hours.  Invalid input(s): MAGNESIUM   No results for input(s): INR, APTT in the last 72 hours.  Invalid input(s): PT   Invalid input(s): ABG    Assessment:  Non-muscle invasive, high-grade bladder cancer, status post TURBT in late 2016  Plan: We will proceed with cystoscopy, repeat TURBT/bladder biopsy.  Risks and complications of the procedure have been discussed with Mr. Krenn.  He is aware that he may have recurrent bladder symptoms as he  did after his first resection in 2016.  We will consider leaving the catheter in for significant length of time.

## 2015-04-04 NOTE — Anesthesia Procedure Notes (Addendum)
Procedure Name: LMA Insertion Date/Time: 04/04/2015 9:25 AM Performed by: Wanita Chamberlain Pre-anesthesia Checklist: Patient identified, Timeout performed, Emergency Drugs available, Suction available and Patient being monitored Patient Re-evaluated:Patient Re-evaluated prior to inductionOxygen Delivery Method: Circle system utilized Preoxygenation: Pre-oxygenation with 100% oxygen Intubation Type: IV induction Ventilation: Mask ventilation without difficulty LMA: LMA inserted LMA Size: 5.0 Number of attempts: 1 Airway Equipment and Method: Bite block Placement Confirmation: breath sounds checked- equal and bilateral and positive ETCO2 Tube secured with: Tape Dental Injury: Teeth and Oropharynx as per pre-operative assessment

## 2015-04-05 ENCOUNTER — Encounter (HOSPITAL_BASED_OUTPATIENT_CLINIC_OR_DEPARTMENT_OTHER): Payer: Self-pay | Admitting: Urology

## 2015-04-23 ENCOUNTER — Telehealth: Payer: Self-pay | Admitting: Pulmonary Disease

## 2015-04-23 ENCOUNTER — Encounter: Payer: Self-pay | Admitting: Pulmonary Disease

## 2015-04-23 ENCOUNTER — Ambulatory Visit (INDEPENDENT_AMBULATORY_CARE_PROVIDER_SITE_OTHER): Payer: Medicare Other | Admitting: Pulmonary Disease

## 2015-04-23 VITALS — BP 144/70 | HR 57 | Ht 71.0 in | Wt 130.8 lb

## 2015-04-23 DIAGNOSIS — R0600 Dyspnea, unspecified: Secondary | ICD-10-CM

## 2015-04-23 DIAGNOSIS — J850 Gangrene and necrosis of lung: Secondary | ICD-10-CM | POA: Diagnosis not present

## 2015-04-23 NOTE — Assessment & Plan Note (Signed)
Addressed all his concerns Greater than 50% time was spent in counseling and coordination of care with the patient

## 2015-04-23 NOTE — Telephone Encounter (Signed)
[facility] had an opening at 1:45 today. Per michelle okay to use. Called spoke with pt. Aware this is in [place] office. Nothing further needed

## 2015-04-23 NOTE — Assessment & Plan Note (Signed)
We discussed risks & benefits of BCG on lung function

## 2015-04-23 NOTE — Progress Notes (Signed)
   Subjective:    Patient ID: Tony Prince, male    DOB: 10/20/1932, 82 y.o.   MRN: [id number]  HPI 82 year old remote pipe smoker History of necrotic pna in upper lobe s/p resection 08/2012 Hx of recurrent sinusitis s/p treatment   04/23/2015  Chief Complaint  Patient presents with  . Follow-up    Former [facility] patient, diagnosed with Bladder cancer, standard protocol is BCG injections are given. (patient has not started BCG treatment yet) patient is here today because he is getting flu-like symptoms every day.  he had major thoracic surgery done on the right upper lobe of his lung where some of his lobe was removed due to a tumor; patient wants to know if the BCG would cause stress to his lungs?   Was diagnosed with bladder CA - Underwent resection Has concern about receivingBCG - flu like smptoms, numerous questions about this  Bone ache,muscle ache, burning every night since surgery over his right chest area  Works out at [employer]- 1 mi on treadmill  Significant tests/ events  CT chest 10/2013 post op changes   CXR 11/2014 - hyperinflation  PFTs 05/2013 >>  Ratio 71, FEV1 72%, FVC 74%, TLC 67%, DLCO 51% corrects for Va  I have reviewed all relevant imaging & test data & reconciled meds   Review of Systems Patient denies significant dyspnea,cough, hemoptysis,  chest pain, palpitations, pedal edema, orthopnea, paroxysmal nocturnal dyspnea, lightheadedness, nausea, vomiting, abdominal or  leg pains      Objective:   Physical Exam  Gen. Pleasant, well-nourished, in no distress ENT - no lesions, no post nasal drip Neck: No JVD, no thyromegaly, no carotid bruits Lungs: no use of accessory muscles, no dullness to percussion, decreased without rales or rhonchi  Cardiovascular: Rhythm regular, heart sounds  normal, no murmurs or gallops, no peripheral edema Musculoskeletal: No deformities, no cyanosis or clubbing        Assessment & Plan:

## 2015-04-23 NOTE — Patient Instructions (Signed)
We discussed risks & benefits of BCG on lung function

## 2015-05-08 ENCOUNTER — Ambulatory Visit (INDEPENDENT_AMBULATORY_CARE_PROVIDER_SITE_OTHER): Payer: Medicare Other

## 2015-05-08 ENCOUNTER — Encounter: Payer: Self-pay | Admitting: Podiatry

## 2015-05-08 ENCOUNTER — Ambulatory Visit (INDEPENDENT_AMBULATORY_CARE_PROVIDER_SITE_OTHER): Payer: Medicare Other | Admitting: Podiatry

## 2015-05-08 VITALS — BP 131/64 | HR 52 | Resp 16

## 2015-05-08 DIAGNOSIS — M779 Enthesopathy, unspecified: Secondary | ICD-10-CM | POA: Diagnosis not present

## 2015-05-08 DIAGNOSIS — M79672 Pain in left foot: Secondary | ICD-10-CM | POA: Diagnosis not present

## 2015-05-08 DIAGNOSIS — M722 Plantar fascial fibromatosis: Secondary | ICD-10-CM | POA: Diagnosis not present

## 2015-05-08 DIAGNOSIS — L84 Corns and callosities: Secondary | ICD-10-CM

## 2015-05-08 MED ORDER — TRIAMCINOLONE ACETONIDE 10 MG/ML IJ SUSP
10.0000 mg | Freq: Once | INTRAMUSCULAR | Status: AC
  Administered 2015-05-08: 10 mg

## 2015-05-08 NOTE — Patient Instructions (Signed)

## 2015-05-08 NOTE — Progress Notes (Signed)
Subjective:     Patient ID: Tony Prince, male   DOB: [birth year], 82 y.o.   MRN: [id number]  HPI patient states the outside of my left foot has started to hurt a lot again and I'm getting pain in my left heel and I am due to have treatment for bladder cancer   Review of Systems     Objective:   Physical Exam Neurovascular status unchanged with keratotic lesion of the left fifth MPJ with fluid buildup and pain when pressed into the lesion itself. Pain also noted in the plantar heel    Assessment:     Inflammatory capsulitis left fifth MPJ with keratotic lesion formation and plantar fasciitis left    Plan:     Reviewed both conditions and recommended physical therapy for the plantar fascia and shoe gear modifications and today I injected the left fifth MPJ 3 mg dexamethasone Kenalog 5 mg Xylocaine and after appropriate numbness did debridement of lesions and padding. No iatrogenic bleeding and is noted and he'll be seen back

## 2017-06-09 ENCOUNTER — Other Ambulatory Visit: Payer: Self-pay | Admitting: Geriatric Medicine

## 2017-06-09 ENCOUNTER — Other Ambulatory Visit: Payer: Self-pay

## 2017-06-09 ENCOUNTER — Ambulatory Visit
Admission: RE | Admit: 2017-06-09 | Discharge: 2017-06-09 | Disposition: A | Discharge status: Home / Self Care | Payer: Medicare Other | Source: Ambulatory Visit | Attending: Geriatric Medicine | Admitting: Geriatric Medicine

## 2017-06-09 DIAGNOSIS — R0609 Other forms of dyspnea: Principal | ICD-10-CM

## 2017-08-03 ENCOUNTER — Encounter: Payer: Self-pay | Admitting: Cardiovascular Disease

## 2017-08-03 ENCOUNTER — Ambulatory Visit: Payer: Medicare Other | Admitting: Cardiovascular Disease

## 2017-08-03 VITALS — BP 146/74 | HR 51 | Ht 71.0 in | Wt 133.6 lb

## 2017-08-03 DIAGNOSIS — I700 Atherosclerosis of aorta: Secondary | ICD-10-CM

## 2017-08-03 DIAGNOSIS — R0609 Other forms of dyspnea: Secondary | ICD-10-CM

## 2017-08-03 DIAGNOSIS — Z8551 Personal history of malignant neoplasm of bladder: Secondary | ICD-10-CM

## 2017-08-03 DIAGNOSIS — Z79899 Other long term (current) drug therapy: Secondary | ICD-10-CM | POA: Diagnosis not present

## 2017-08-03 NOTE — Patient Instructions (Signed)
Medication Instructions:  Your physician recommends that you continue on your current medications as directed. Please refer to the Current Medication list given to you today.  Labwork: Please return for FASTING labs (Lipid, TSH)  Our in office lab hours are Monday-Friday 8:00-4:00, closed for lunch 12:45-1:45 pm.  No appointment needed.  Testing/Procedures: Your physician has requested that you have an echocardiogram. Echocardiography is a painless test that uses sound waves to create images of your heart. It provides your doctor with information about the size and shape of your heart and how well your heart's chambers and valves are working. This procedure takes approximately one hour. There are no restrictions for this procedure.  This will be done at our [facility] location:  [facility] has requested that you have an exercise stress myoview. For further information please visit [email or web address]. Please follow instruction sheet, as given.  Follow-Up: 4-6 weeks with Dr. Claiborne Billings  Any Other Special Instructions Will Be Listed Below (If Applicable).     If you need a refill on your cardiac medications before your next appointment, please call your pharmacy.
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
## 2017-08-04 LAB — LIPID PANEL
CHOLESTEROL TOTAL: 170 mg/dL (ref 100–199)
Chol/HDL Ratio: 2.7 ratio (ref 0.0–5.0)
HDL: 63 mg/dL (ref >39)
LDL Calculated: 95 mg/dL (ref 0–99)
Triglycerides: 59 mg/dL (ref 0–149)
VLDL CHOLESTEROL CAL: 12 mg/dL (ref 5–40)

## 2017-08-04 LAB — TSH: TSH: 4.09 u[IU]/mL (ref 0.450–4.500)

## 2017-08-05 ENCOUNTER — Encounter: Payer: Self-pay | Admitting: Cardiovascular Disease

## 2017-08-11 ENCOUNTER — Telehealth (HOSPITAL_COMMUNITY): Payer: Self-pay

## 2017-08-11 ENCOUNTER — Ambulatory Visit (HOSPITAL_COMMUNITY): Payer: Medicare Other | Attending: Cardiology

## 2017-08-11 ENCOUNTER — Other Ambulatory Visit: Payer: Self-pay

## 2017-08-11 DIAGNOSIS — I071 Rheumatic tricuspid insufficiency: Secondary | ICD-10-CM | POA: Diagnosis not present

## 2017-08-11 DIAGNOSIS — R0609 Other forms of dyspnea: Secondary | ICD-10-CM | POA: Insufficient documentation

## 2017-08-11 DIAGNOSIS — R0600 Dyspnea, unspecified: Secondary | ICD-10-CM | POA: Diagnosis present

## 2017-08-11 NOTE — Telephone Encounter (Signed)
Encounter complete. 

## 2017-08-12 ENCOUNTER — Telehealth (HOSPITAL_COMMUNITY): Payer: Self-pay

## 2017-08-13 ENCOUNTER — Ambulatory Visit (HOSPITAL_COMMUNITY)
Admission: RE | Admit: 2017-08-13 | Discharge: 2017-08-13 | Disposition: A | Discharge status: Home / Self Care | Payer: Medicare Other | Source: Ambulatory Visit | Attending: Cardiology | Admitting: Cardiology

## 2017-08-13 DIAGNOSIS — R0609 Other forms of dyspnea: Secondary | ICD-10-CM | POA: Insufficient documentation

## 2017-08-13 LAB — MYOCARDIAL PERFUSION IMAGING
CHL CUP RESTING HR STRESS: 54 {beats}/min
CHL RATE OF PERCEIVED EXERTION: 19
CSEPEDS: 2 s
CSEPHR: 84 %
Estimated workload: 7.1 METS
Exercise duration (min): 8 min
LV sys vol: 19 mL
LVDIAVOL: 70 mL (ref 62–150)
MPHR: 138 {beats}/min
Peak HR: 117 {beats}/min
SDS: 2
SRS: 1
SSS: 3
TID: 0.86

## 2017-08-13 MED ORDER — TECHNETIUM TC 99M TETROFOSMIN IV KIT
31.5000 | PACK | Freq: Once | INTRAVENOUS | Status: AC | PRN
Start: 2017-08-13 — End: 2017-08-13
  Administered 2017-08-13: 31.5 via INTRAVENOUS
  Filled 2017-08-13: qty 32

## 2017-08-13 MED ORDER — TECHNETIUM TC 99M TETROFOSMIN IV KIT
10.4000 | PACK | Freq: Once | INTRAVENOUS | Status: AC | PRN
  Administered 2017-08-13: 10.4 via INTRAVENOUS
  Filled 2017-08-13: qty 11

## 2017-09-03 ENCOUNTER — Ambulatory Visit: Payer: Medicare Other | Admitting: Cardiovascular Disease

## 2017-09-03 ENCOUNTER — Encounter: Payer: Self-pay | Admitting: Cardiovascular Disease

## 2017-09-03 VITALS — BP 157/67 | HR 59 | Ht 71.0 in | Wt 133.4 lb

## 2017-09-03 DIAGNOSIS — Z8551 Personal history of malignant neoplasm of bladder: Secondary | ICD-10-CM

## 2017-09-03 DIAGNOSIS — I700 Atherosclerosis of aorta: Secondary | ICD-10-CM

## 2017-09-03 DIAGNOSIS — R0609 Other forms of dyspnea: Secondary | ICD-10-CM
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
## 2017-09-03 NOTE — Patient Instructions (Signed)
Medication Instructions:  Your physician recommends that you continue on your current medications as directed. Please refer to the Current Medication list given to you today.  Follow-Up: As needed with Dr. Claiborne Billings    If you need a refill on your cardiac medications before your next appointment, please call your pharmacy.

## 2017-09-04 ENCOUNTER — Encounter: Payer: Self-pay | Admitting: Cardiovascular Disease

## 2017-09-22 NOTE — Telephone Encounter (Signed)
complete

## 2017-11-03 ENCOUNTER — Encounter

## 2019-02-06 IMAGING — NM NM MISC PROCEDURE
9 series · 54 of 54 positions shown · non-contrast
Comparison: none

[Series 1: wbr_r-proj_st wbr rest · 6.40mm/px · 6 of 64 frames shown]
[frame 6/64]
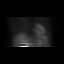
[frame 16/64]
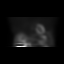
[frame 27/64]
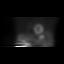
[frame 38/64]
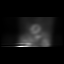
[frame 48/64]
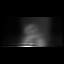
[frame 59/64]
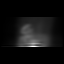

[Series 1: wbr rest · 6.40mm/px · 6 of 64 frames shown]
[frame 6/64]
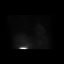
[frame 16/64]
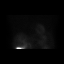
[frame 27/64]
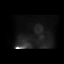
[frame 38/64]
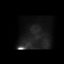
[frame 48/64]
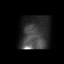
[frame 59/64]
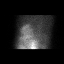

[Series 1: rest sax · 6.4mm · 6.40mm/px · 6 of 23 frames shown]
[frame 2/23]
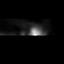
[frame 6/23]
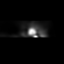
[frame 10/23]
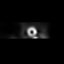
[frame 14/23]
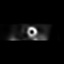
[frame 18/23]
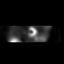
[frame 22/23]
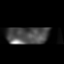

[Series 2: wbr stress-gsp · 6.40mm/px · 6 of 512 frames shown]
[frame 43/512]
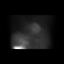
[frame 128/512]
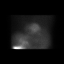
[frame 214/512]
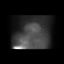
[frame 299/512]
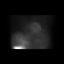
[frame 384/512]
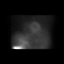
[frame 470/512]
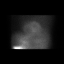

[Series 2: stress sax gs · 6.4mm · 6.40mm/px · 6 of 184 frames shown]
[frame 16/184]
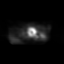
[frame 46/184]
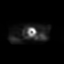
[frame 77/184]
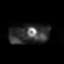
[frame 108/184]
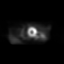
[frame 138/184]
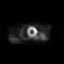
[frame 169/184]
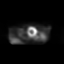

[Series 2: wbr_s-proj_st wbr stress-gsp · 6.40mm/px · 6 of 512 frames shown]
[frame 43/512]
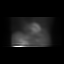
[frame 128/512]
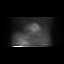
[frame 214/512]
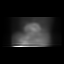
[frame 299/512]
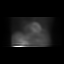
[frame 384/512]
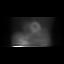
[frame 470/512]
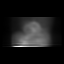

[Series 3: stress sax · 6.4mm · 6.40mm/px · 6 of 23 frames shown]
[frame 2/23]
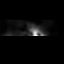
[frame 6/23]
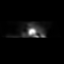
[frame 10/23]
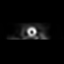
[frame 14/23]
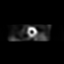
[frame 18/23]
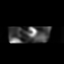
[frame 22/23]
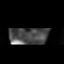

[Series 3: wbr stress-sum-em · 6.40mm/px · 6 of 64 frames shown]
[frame 6/64]
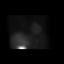
[frame 16/64]
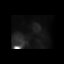
[frame 27/64]
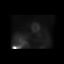
[frame 38/64]
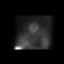
[frame 48/64]
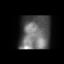
[frame 59/64]
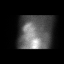

[Series 3: wbr_s-proj_st wbr stress-sum-em · 6.40mm/px · 6 of 64 frames shown]
[frame 6/64]
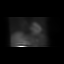
[frame 16/64]
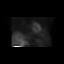
[frame 27/64]
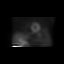
[frame 38/64]
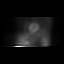
[frame 48/64]
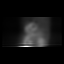
[frame 59/64]
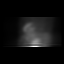

[54 of 54 positions shown; findings below may reference images not displayed]

Canned report from images found in remote index.

Refer to host system for actual result text.

## 2019-04-18 ENCOUNTER — Ambulatory Visit: Payer: Medicare Other

## 2019-04-27 ENCOUNTER — Ambulatory Visit: Payer: Medicare PPO | Attending: Internal Medicine

## 2019-04-27 DIAGNOSIS — Z23 Encounter for immunization: Secondary | ICD-10-CM | POA: Insufficient documentation

## 2019-04-27 NOTE — Progress Notes (Signed)
   Covid-19 Vaccination Clinic  Name:  Tony Prince    MRN: [id number] DOB: 08/01/1932  04/27/2019  [name] was observed post Covid-19 immunization for 15 minutes without incidence. He was provided with Vaccine Information Sheet and instruction to access the V-Safe system.   [name] was instructed to call 911 with any severe reactions post vaccine: Marland Kitchen Difficulty breathing  . Swelling of your face and throat  . A fast heartbeat  . A bad rash all over your body  . Dizziness and weakness    Immunizations Administered    Name Date Dose VIS Date Route   Pfizer COVID-19 Vaccine 04/27/2019  2:36 PM 0.3 mL 03/03/2019 Intramuscular   Manufacturer: [employer]   Lot: [id number]   [place]: [address]-1

## 2019-05-22 ENCOUNTER — Ambulatory Visit: Payer: Medicare PPO | Attending: Internal Medicine

## 2019-05-22 DIAGNOSIS — Z23 Encounter for immunization: Secondary | ICD-10-CM | POA: Insufficient documentation

## 2019-05-22 NOTE — Progress Notes (Signed)
   Covid-19 Vaccination Clinic  Name:  Tony Prince    MRN: [id number] DOB: April 03, 1932  05/22/2019  Tony Prince was observed post Covid-19 immunization for 15 minutes without incidence. He was provided with Vaccine Information Sheet and instruction to access the V-Safe system.   Tony Prince was instructed to call 911 with any severe reactions post vaccine: Marland Kitchen Difficulty breathing  . Swelling of your face and throat  . A fast heartbeat  . A bad rash all over your body  . Dizziness and weakness    Immunizations Administered    Name Date Dose VIS Date Route   Pfizer COVID-19 Vaccine 05/22/2019  4:37 PM 0.3 mL 03/03/2019 Intramuscular   Manufacturer: [employer]   Lot: [id number]   [place]: [id number]

## 2019-11-02 DIAGNOSIS — L858 Other specified epidermal thickening: Secondary | ICD-10-CM | POA: Diagnosis not present

## 2019-11-02 DIAGNOSIS — Z85828 Personal history of other malignant neoplasm of skin: Secondary | ICD-10-CM | POA: Diagnosis not present

## 2020-01-16 DIAGNOSIS — Z1389 Encounter for screening for other disorder: Secondary | ICD-10-CM | POA: Diagnosis not present

## 2020-01-16 DIAGNOSIS — K219 Gastro-esophageal reflux disease without esophagitis: Secondary | ICD-10-CM | POA: Diagnosis not present

## 2020-01-16 DIAGNOSIS — Z8551 Personal history of malignant neoplasm of bladder: Secondary | ICD-10-CM | POA: Diagnosis not present

## 2020-01-16 DIAGNOSIS — D696 Thrombocytopenia, unspecified: Secondary | ICD-10-CM | POA: Diagnosis not present

## 2020-01-16 DIAGNOSIS — Z0000 Encounter for general adult medical examination without abnormal findings: Secondary | ICD-10-CM | POA: Diagnosis not present

## 2020-01-16 DIAGNOSIS — M810 Age-related osteoporosis without current pathological fracture: Secondary | ICD-10-CM | POA: Diagnosis not present

## 2020-01-16 DIAGNOSIS — Z79899 Other long term (current) drug therapy: Secondary | ICD-10-CM | POA: Diagnosis not present

## 2020-01-16 DIAGNOSIS — I700 Atherosclerosis of aorta: Secondary | ICD-10-CM | POA: Diagnosis not present

## 2020-02-01 ENCOUNTER — Ambulatory Visit: Payer: Medicare PPO | Admitting: Podiatry

## 2020-02-01 ENCOUNTER — Other Ambulatory Visit: Payer: Self-pay

## 2020-02-01 DIAGNOSIS — L84 Corns and callosities: Secondary | ICD-10-CM | POA: Diagnosis not present

## 2020-02-01 DIAGNOSIS — M779 Enthesopathy, unspecified: Secondary | ICD-10-CM | POA: Diagnosis not present

## 2020-02-01 DIAGNOSIS — M21622 Bunionette of left foot: Secondary | ICD-10-CM

## 2020-02-02 NOTE — Progress Notes (Signed)
Subjective:   Patient ID: Tony Prince, male   DOB: 86 y.o.   MRN: [id number]   HPI Patient presents stating not been seen for a number years and is having a lot of pain in the bottom of his left foot over the last few months.  States he did very well for an extended period of time and patient overall has been healthy does not smoke and would like to be more active   Review of Systems  All other systems reviewed and are negative.       Objective:  Physical Exam Vitals and nursing note reviewed.  Constitutional:      Appearance: He is well-developed.  Pulmonary:     Effort: Pulmonary effort is normal.  Musculoskeletal:        General: Normal range of motion.  Skin:    General: Skin is warm.  Neurological:     Mental Status: He is alert.     Neurovascular status was found to be intact muscle strength found to be adequate range of motion adequate.  Patient is found to have inflammation fluid around the fifth MPJ left with large keratotic lesion formation and prominence of the bone structure.  Patient is found to have good digital perfusion well oriented x3     Assessment:  Acute capsulitis of the fifth MPJ left with fluid buildup significant lesion secondary to tailor's bunion and metatarsal protrusion and generalized tailor's bunion deformity     Plan:  H&P reviewed all conditions.  At this point sterile prep done and injected the plantar capsule 2 mg dexamethasone 5 mg Xylocaine.  I then debrided large plantar lesion with no iatrogenic bleeding I discussed tailor's bunion and discussed possibility for fifth metatarsal head resection which may be necessary at 1 point in future.  Educated him on deformity

## 2020-07-17 ENCOUNTER — Ambulatory Visit
Admission: RE | Admit: 2020-07-17 | Discharge: 2020-07-17 | Disposition: A | Discharge status: Home / Self Care | Payer: Medicare PPO | Source: Ambulatory Visit | Attending: Geriatric Medicine | Admitting: Geriatric Medicine

## 2020-07-17 ENCOUNTER — Other Ambulatory Visit: Payer: Self-pay | Admitting: Geriatric Medicine

## 2020-07-17 DIAGNOSIS — R42 Dizziness and giddiness: Secondary | ICD-10-CM | POA: Diagnosis not present

## 2020-07-17 DIAGNOSIS — J449 Chronic obstructive pulmonary disease, unspecified: Secondary | ICD-10-CM | POA: Diagnosis not present

## 2020-07-17 DIAGNOSIS — R0600 Dyspnea, unspecified: Secondary | ICD-10-CM

## 2020-07-17 DIAGNOSIS — R0609 Other forms of dyspnea: Secondary | ICD-10-CM

## 2020-07-17 DIAGNOSIS — I700 Atherosclerosis of aorta: Secondary | ICD-10-CM | POA: Diagnosis not present

## 2020-07-17 DIAGNOSIS — D696 Thrombocytopenia, unspecified: Secondary | ICD-10-CM | POA: Diagnosis not present

## 2020-07-22 DIAGNOSIS — C678 Malignant neoplasm of overlapping sites of bladder: Secondary | ICD-10-CM | POA: Diagnosis not present

## 2020-07-31 ENCOUNTER — Encounter: Payer: Self-pay | Admitting: Podiatry

## 2020-07-31 ENCOUNTER — Ambulatory Visit: Payer: Medicare PPO | Admitting: Podiatry

## 2020-07-31 ENCOUNTER — Other Ambulatory Visit: Payer: Self-pay

## 2020-07-31 DIAGNOSIS — M79674 Pain in right toe(s): Secondary | ICD-10-CM | POA: Diagnosis not present

## 2020-07-31 DIAGNOSIS — B351 Tinea unguium: Secondary | ICD-10-CM | POA: Diagnosis not present

## 2020-07-31 DIAGNOSIS — L84 Corns and callosities: Secondary | ICD-10-CM

## 2020-07-31 DIAGNOSIS — M79675 Pain in left toe(s): Secondary | ICD-10-CM

## 2020-07-31 NOTE — Progress Notes (Signed)
Subjective:   Patient ID: Tony Prince, male   DOB: 87 y.o.   MRN: [id number]   HPI Patient presents with incurvated nailbeds 1-5 both feet that become sore and keratotic lesions of fifth metatarsal left tender when pressed making walking difficult   ROS      Objective:  Physical Exam  Neurovascular status intact with incurvated nailbeds 1-5 both feet that he cannot cut and become painful and thick along with keratotic lesion left     Assessment:  Mycotic nail infection lesion formation with pain     Plan:  Debrided nailbeds 1-5 both feet lesion left no echogenic bleeding reappoint routine care

## 2020-09-16 DIAGNOSIS — J0101 Acute recurrent maxillary sinusitis: Secondary | ICD-10-CM | POA: Diagnosis not present

## 2020-09-16 DIAGNOSIS — R5381 Other malaise: Secondary | ICD-10-CM | POA: Diagnosis not present

## 2020-09-16 DIAGNOSIS — R0600 Dyspnea, unspecified: Secondary | ICD-10-CM | POA: Diagnosis not present

## 2020-11-01 ENCOUNTER — Ambulatory Visit: Payer: Medicare PPO | Admitting: Podiatry

## 2020-11-01 ENCOUNTER — Other Ambulatory Visit: Payer: Self-pay

## 2020-11-01 ENCOUNTER — Encounter: Payer: Self-pay | Admitting: Podiatry

## 2020-11-01 DIAGNOSIS — M79674 Pain in right toe(s): Secondary | ICD-10-CM | POA: Diagnosis not present

## 2020-11-01 DIAGNOSIS — M79675 Pain in left toe(s): Secondary | ICD-10-CM | POA: Diagnosis not present

## 2020-11-01 DIAGNOSIS — B351 Tinea unguium: Secondary | ICD-10-CM | POA: Diagnosis not present

## 2020-11-01 DIAGNOSIS — M21622 Bunionette of left foot: Secondary | ICD-10-CM | POA: Diagnosis not present

## 2020-11-01 NOTE — Progress Notes (Signed)
This patient presents to the office for evaluation of callus on bottom outside of his left foot.  He has previously been seen for capsulitis and callus under the bone left foot.  He says there is minimal pain at this visit.  He says he has trimmed his own nails and drew blood.  Therefore he would like his nails to trimmed also.  The nails are painful walking and wearing his shoes.  He presents for evaluation and treatment.  General Appearance  Alert, conversant and in no acute stress.  Vascular  Dorsalis pedis and posterior tibial  pulses are  weakly palpable  bilaterally.  Capillary return is within normal limits  bilaterally. Temperature is within normal limits  bilaterally.  Neurologic  Senn-Weinstein monofilament wire test within normal limits  bilaterally. Muscle power within normal limits bilaterally.  Nails Thick disfigured discolored nails with subungual debris  hallux nails bilaterally. No evidence of bacterial infection or drainage bilaterally.  Orthopedic  No limitations of motion  feet .  No crepitus or effusions noted.  Tailors bunion fifth MPJ left foot.  Skin  normotropic skin  noted bilaterally.  No signs of infections or ulcers noted.  Absence of porokeratosis sub 5th met left foot.  Tailors bunion fifth MPJ left foot.  Onychomycosis  B/L  ROV.  Discussed tailors bunion and informed patient his porokeratosis has resolved.  Debrided his nails with nail nipper followed by dremel tool usage.  Gardiner Barefoot DPM

## 2021-01-22 DIAGNOSIS — D696 Thrombocytopenia, unspecified: Secondary | ICD-10-CM | POA: Diagnosis not present

## 2021-01-22 DIAGNOSIS — K219 Gastro-esophageal reflux disease without esophagitis: Secondary | ICD-10-CM | POA: Diagnosis not present

## 2021-01-22 DIAGNOSIS — Z79899 Other long term (current) drug therapy: Secondary | ICD-10-CM | POA: Diagnosis not present

## 2021-01-22 DIAGNOSIS — M810 Age-related osteoporosis without current pathological fracture: Secondary | ICD-10-CM | POA: Diagnosis not present

## 2021-01-22 DIAGNOSIS — Z0000 Encounter for general adult medical examination without abnormal findings: Secondary | ICD-10-CM | POA: Diagnosis not present

## 2021-01-22 DIAGNOSIS — I700 Atherosclerosis of aorta: Secondary | ICD-10-CM | POA: Diagnosis not present

## 2021-01-22 DIAGNOSIS — R739 Hyperglycemia, unspecified: Secondary | ICD-10-CM | POA: Diagnosis not present

## 2021-03-07 ENCOUNTER — Ambulatory Visit: Payer: Medicare PPO | Admitting: Podiatry

## 2021-03-28 ENCOUNTER — Ambulatory Visit: Payer: Medicare PPO | Admitting: Podiatry

## 2021-03-28 ENCOUNTER — Encounter: Payer: Self-pay | Admitting: Podiatry

## 2021-03-28 ENCOUNTER — Other Ambulatory Visit: Payer: Self-pay

## 2021-03-28 DIAGNOSIS — M79674 Pain in right toe(s): Secondary | ICD-10-CM | POA: Diagnosis not present

## 2021-03-28 DIAGNOSIS — B351 Tinea unguium: Secondary | ICD-10-CM

## 2021-03-28 DIAGNOSIS — M79675 Pain in left toe(s): Secondary | ICD-10-CM | POA: Diagnosis not present

## 2021-03-28 DIAGNOSIS — M21622 Bunionette of left foot: Secondary | ICD-10-CM

## 2021-03-28 NOTE — Progress Notes (Signed)
This patient presents to the office for evaluation of callus on bottom outside of his left foot.  He has previously been seen for capsulitis and callus under the bone left foot.  He says there is minimal pain at this visit.  He says he has trimmed his own nails and drew blood.  Therefore he would like his nails to trimmed also.  The nails are painful walking and wearing his shoes.  He presents for evaluation and treatment.  General Appearance  Alert, conversant and in no acute stress.  Vascular  Dorsalis pedis and posterior tibial  pulses are  weakly palpable  bilaterally.  Capillary return is within normal limits  bilaterally. Temperature is within normal limits  bilaterally.  Neurologic  Senn-Weinstein monofilament wire test within normal limits  bilaterally. Muscle power within normal limits bilaterally.  Nails Thick disfigured discolored nails with subungual debris  hallux nails bilaterally. No evidence of bacterial infection or drainage bilaterally.  Orthopedic  No limitations of motion  feet .  No crepitus or effusions noted.  Tailors bunion fifth MPJ left foot.  Skin  normotropic skin  noted bilaterally.  No signs of infections or ulcers noted.  Absence of porokeratosis sub 5th met left foot  Onychomycosis  B/L   Debrided his nails with nail nipper followed by dremel tool usage.  RTC 4 months  Gardiner Barefoot DPM

## 2021-06-02 DIAGNOSIS — C678 Malignant neoplasm of overlapping sites of bladder: Secondary | ICD-10-CM | POA: Diagnosis not present

## 2021-07-02 ENCOUNTER — Ambulatory Visit: Payer: Medicare PPO | Admitting: Podiatry

## 2021-07-02 ENCOUNTER — Encounter: Payer: Self-pay | Admitting: Podiatry

## 2021-07-02 DIAGNOSIS — M21622 Bunionette of left foot: Secondary | ICD-10-CM

## 2021-07-02 DIAGNOSIS — M79674 Pain in right toe(s): Secondary | ICD-10-CM

## 2021-07-02 DIAGNOSIS — L84 Corns and callosities: Secondary | ICD-10-CM | POA: Diagnosis not present

## 2021-07-02 DIAGNOSIS — B351 Tinea unguium: Secondary | ICD-10-CM | POA: Diagnosis not present

## 2021-07-02 DIAGNOSIS — M79675 Pain in left toe(s): Secondary | ICD-10-CM | POA: Diagnosis not present

## 2021-07-02 NOTE — Progress Notes (Signed)
This patient presents to the office for evaluation of callus on bottom outside of his left foot.  He has previously been seen for capsulitis and callus under the bone left foot.  He says there is minimal pain at this visit.  He says he has trimmed his own nails and drew blood.  Therefore he would like his nails to trimmed also.  The nails are painful walking and wearing his shoes.  He presents for evaluation and treatment.  General Appearance  Alert, conversant and in no acute stress.  Vascular  Dorsalis pedis and posterior tibial  pulses are  weakly palpable  bilaterally.  Capillary return is within normal limits  bilaterally. Temperature is within normal limits  bilaterally.  Neurologic  Senn-Weinstein monofilament wire test within normal limits  bilaterally. Muscle power within normal limits bilaterally.  Nails Thick disfigured discolored nails with subungual debris  hallux nails bilaterally. No evidence of bacterial infection or drainage bilaterally.  Orthopedic  No limitations of motion  feet .  No crepitus or effusions noted.  Tailors bunion fifth MPJ left foot.  Skin  normotropic skin  noted bilaterally.  No signs of infections or ulcers noted.  Absence of porokeratosis sub 5th met left foot  Onychomycosis  B/L   Debrided his nails with nail nipper followed by dremel tool usage.  RTC 3   months  Orazio Weller DPM  

## 2021-07-23 DIAGNOSIS — I700 Atherosclerosis of aorta: Secondary | ICD-10-CM | POA: Diagnosis not present

## 2021-07-23 DIAGNOSIS — K219 Gastro-esophageal reflux disease without esophagitis: Secondary | ICD-10-CM | POA: Diagnosis not present

## 2021-10-08 ENCOUNTER — Ambulatory Visit: Payer: Medicare PPO | Admitting: Podiatry

## 2021-10-08 DIAGNOSIS — Z8551 Personal history of malignant neoplasm of bladder: Secondary | ICD-10-CM | POA: Diagnosis not present

## 2021-10-08 DIAGNOSIS — N390 Urinary tract infection, site not specified: Secondary | ICD-10-CM | POA: Diagnosis not present

## 2021-10-08 DIAGNOSIS — N3000 Acute cystitis without hematuria: Secondary | ICD-10-CM | POA: Diagnosis not present

## 2021-10-13 ENCOUNTER — Encounter: Payer: Self-pay | Admitting: Podiatry

## 2021-10-13 ENCOUNTER — Ambulatory Visit: Payer: Medicare PPO | Admitting: Podiatry

## 2021-10-13 DIAGNOSIS — M79674 Pain in right toe(s): Secondary | ICD-10-CM

## 2021-10-13 DIAGNOSIS — B351 Tinea unguium: Secondary | ICD-10-CM

## 2021-10-13 DIAGNOSIS — M79675 Pain in left toe(s): Secondary | ICD-10-CM

## 2021-10-13 DIAGNOSIS — M779 Enthesopathy, unspecified: Secondary | ICD-10-CM

## 2021-10-13 NOTE — Progress Notes (Signed)
This patient presents to the office for evaluation of callus on bottom outside of his left foot.  He has previously been seen for capsulitis and callus under the bone left foot.  He says there is minimal pain at this visit.  He says he has trimmed his own nails and drew blood.  Therefore he would like his nails to trimmed also.  The nails are painful walking and wearing his shoes.  He presents for evaluation and treatment.  General Appearance  Alert, conversant and in no acute stress.  Vascular  Dorsalis pedis and posterior tibial  pulses are  weakly palpable  bilaterally.  Capillary return is within normal limits  bilaterally. Temperature is within normal limits  bilaterally.  Neurologic  Senn-Weinstein monofilament wire test within normal limits  bilaterally. Muscle power within normal limits bilaterally.  Nails Thick disfigured discolored nails with subungual debris  hallux nails bilaterally. No evidence of bacterial infection or drainage bilaterally.  Orthopedic  No limitations of motion  feet .  No crepitus or effusions noted.  Tailors bunion fifth MPJ left foot.  Skin  normotropic skin  noted bilaterally.  No signs of infections or ulcers noted.  Absence of porokeratosis sub 5th met left foot  Onychomycosis  B/L   Debrided his nails with nail nipper followed by dremel tool usage.  RTC 3   months  Gardiner Barefoot DPM

## 2021-10-22 DIAGNOSIS — Z8551 Personal history of malignant neoplasm of bladder: Secondary | ICD-10-CM | POA: Diagnosis not present

## 2022-01-21 ENCOUNTER — Ambulatory Visit: Payer: Medicare PPO | Admitting: Podiatry

## 2022-01-21 ENCOUNTER — Encounter: Payer: Self-pay | Admitting: Podiatry

## 2022-01-21 DIAGNOSIS — M79675 Pain in left toe(s): Secondary | ICD-10-CM | POA: Diagnosis not present

## 2022-01-21 DIAGNOSIS — M21622 Bunionette of left foot: Secondary | ICD-10-CM | POA: Diagnosis not present

## 2022-01-21 DIAGNOSIS — M79674 Pain in right toe(s): Secondary | ICD-10-CM

## 2022-01-21 DIAGNOSIS — B351 Tinea unguium: Secondary | ICD-10-CM | POA: Diagnosis not present

## 2022-01-21 NOTE — Progress Notes (Signed)
This patient presents to the office for evaluation of callus on bottom outside of his left foot.  He has previously been seen for capsulitis and callus under the bone left foot.  He says there is minimal pain at this visit.  He says he has trimmed his own nails and drew blood.  Therefore he would like his nails to trimmed also.  The nails are painful walking and wearing his shoes.  He presents for evaluation and treatment.  General Appearance  Alert, conversant and in no acute stress.  Vascular  Dorsalis pedis and posterior tibial  pulses are  weakly palpable  bilaterally.  Capillary return is within normal limits  bilaterally. Temperature is within normal limits  bilaterally.  Neurologic  Senn-Weinstein monofilament wire test within normal limits  bilaterally. Muscle power within normal limits bilaterally.  Nails Thick disfigured discolored nails with subungual debris  hallux nails bilaterally. No evidence of bacterial infection or drainage bilaterally.  Orthopedic  No limitations of motion  feet .  No crepitus or effusions noted.  Tailors bunion fifth MPJ left foot.  Skin  normotropic skin  noted bilaterally.  No signs of infections or ulcers noted.  Absence of porokeratosis sub 5th met left foot  Onychomycosis  B/L   Debrided his nails with nail nipper followed by dremel tool usage.  RTC 3   months  Gardiner Barefoot DPM

## 2022-02-17 ENCOUNTER — Other Ambulatory Visit: Payer: Self-pay | Admitting: Internal Medicine

## 2022-02-17 DIAGNOSIS — D696 Thrombocytopenia, unspecified: Secondary | ICD-10-CM | POA: Diagnosis not present

## 2022-02-17 DIAGNOSIS — M810 Age-related osteoporosis without current pathological fracture: Secondary | ICD-10-CM | POA: Diagnosis not present

## 2022-02-17 DIAGNOSIS — R634 Abnormal weight loss: Secondary | ICD-10-CM | POA: Diagnosis not present

## 2022-02-17 DIAGNOSIS — I700 Atherosclerosis of aorta: Secondary | ICD-10-CM | POA: Diagnosis not present

## 2022-02-17 DIAGNOSIS — Z0000 Encounter for general adult medical examination without abnormal findings: Secondary | ICD-10-CM | POA: Diagnosis not present

## 2022-02-17 DIAGNOSIS — R739 Hyperglycemia, unspecified: Secondary | ICD-10-CM | POA: Diagnosis not present

## 2022-02-17 DIAGNOSIS — Z1331 Encounter for screening for depression: Secondary | ICD-10-CM | POA: Diagnosis not present

## 2022-02-17 DIAGNOSIS — R0602 Shortness of breath: Secondary | ICD-10-CM | POA: Diagnosis not present

## 2022-02-17 DIAGNOSIS — R030 Elevated blood-pressure reading, without diagnosis of hypertension: Secondary | ICD-10-CM | POA: Diagnosis not present

## 2022-02-17 DIAGNOSIS — Z5181 Encounter for therapeutic drug level monitoring: Secondary | ICD-10-CM | POA: Diagnosis not present

## 2022-02-17 DIAGNOSIS — K219 Gastro-esophageal reflux disease without esophagitis: Secondary | ICD-10-CM | POA: Diagnosis not present

## 2022-05-01 ENCOUNTER — Ambulatory Visit: Payer: Medicare PPO | Admitting: Podiatry

## 2022-05-01 ENCOUNTER — Encounter: Payer: Self-pay | Admitting: Podiatry

## 2022-05-01 DIAGNOSIS — M21622 Bunionette of left foot: Secondary | ICD-10-CM

## 2022-05-01 DIAGNOSIS — M79674 Pain in right toe(s): Secondary | ICD-10-CM | POA: Diagnosis not present

## 2022-05-01 DIAGNOSIS — L84 Corns and callosities: Secondary | ICD-10-CM | POA: Diagnosis not present

## 2022-05-01 DIAGNOSIS — M79675 Pain in left toe(s): Secondary | ICD-10-CM | POA: Diagnosis not present

## 2022-05-01 DIAGNOSIS — B351 Tinea unguium: Secondary | ICD-10-CM | POA: Diagnosis not present

## 2022-05-01 NOTE — Progress Notes (Signed)
This patient presents to the office for evaluation of callus on bottom outside of his left foot.  He has previously been seen for capsulitis and callus under the bone left foot.  He says there is pain at this visit.    he would like his nails to trimmed also.  The nails are painful walking and wearing his shoes.  He presents for evaluation and treatment.  General Appearance  Alert, conversant and in no acute stress.  Vascular  Dorsalis pedis and posterior tibial  pulses are  weakly palpable  bilaterally.  Capillary return is within normal limits  bilaterally. Temperature is within normal limits  bilaterally.  Neurologic  Senn-Weinstein monofilament wire test within normal limits  bilaterally. Muscle power within normal limits bilaterally.  Nails Thick disfigured discolored nails with subungual debris  hallux nails bilaterally. No evidence of bacterial infection or drainage bilaterally.  Orthopedic  No limitations of motion  feet .  No crepitus or effusions noted.  Tailors bunion fifth MPJ left foot.  Skin  normotropic skin  noted bilaterally.  No signs of infections or ulcers noted.  porokeratosis sub 5th met left foot  Onychomycosis  B/L   Debrided his nails with nail nipper followed by dremel tool usage.   Discussed surgical correction of left foot and told him to make an appointment with Dr.  Paulla Dolly for possible surgery. RTC 3   months  Gardiner Barefoot DPM

## 2022-05-08 ENCOUNTER — Ambulatory Visit (INDEPENDENT_AMBULATORY_CARE_PROVIDER_SITE_OTHER): Payer: Medicare PPO

## 2022-05-08 ENCOUNTER — Ambulatory Visit: Payer: Medicare PPO | Admitting: Podiatry

## 2022-05-08 ENCOUNTER — Encounter: Payer: Self-pay | Admitting: Podiatry

## 2022-05-08 DIAGNOSIS — M7752 Other enthesopathy of left foot: Secondary | ICD-10-CM

## 2022-05-08 DIAGNOSIS — M21622 Bunionette of left foot: Secondary | ICD-10-CM | POA: Diagnosis not present

## 2022-05-08 MED ORDER — TRIAMCINOLONE ACETONIDE 10 MG/ML IJ SUSP
10.0000 mg | Freq: Once | INTRAMUSCULAR | Status: AC
  Administered 2022-05-08: 10 mg

## 2022-05-08 NOTE — Progress Notes (Signed)
Subjective:   Patient ID: Tony Prince, male   DOB: 89 y.o.   MRN: [id number]   HPI Patient presents to discuss surgery if there is anything else we can do to help the pain he gets in his right foot stating he would like to avoid surgery if he can   ROS      Objective:  Physical Exam  Neurovascular status intact with patient found to have inflammation pain fifth metatarsal head left with fluid around the joint surface and keratotic tissue that becomes tender over time     Assessment:  Inflammatory capsulitis of the fifth MPJ left with lesion     Plan:  Organ to try conservative even though I did discuss head resection with him today.  Sterile prep injected the plantar capsule 3 mg dexamethasone Kenalog 5 mg Xylocaine and patient will be seen back to reevaluate  X-rays indicate that there is some enlargement around the structure but no other pathology noted on x-ray

## 2022-07-14 DIAGNOSIS — Z85828 Personal history of other malignant neoplasm of skin: Secondary | ICD-10-CM | POA: Diagnosis not present

## 2022-07-14 DIAGNOSIS — L400 Psoriasis vulgaris: Secondary | ICD-10-CM | POA: Diagnosis not present

## 2022-07-14 DIAGNOSIS — L821 Other seborrheic keratosis: Secondary | ICD-10-CM | POA: Diagnosis not present

## 2022-07-30 ENCOUNTER — Encounter: Payer: Self-pay | Admitting: Podiatry

## 2022-07-30 ENCOUNTER — Ambulatory Visit: Payer: Medicare PPO | Admitting: Podiatry

## 2022-07-30 DIAGNOSIS — M79675 Pain in left toe(s): Secondary | ICD-10-CM

## 2022-07-30 DIAGNOSIS — B351 Tinea unguium: Secondary | ICD-10-CM

## 2022-07-30 DIAGNOSIS — M79674 Pain in right toe(s): Secondary | ICD-10-CM

## 2022-07-30 DIAGNOSIS — L84 Corns and callosities: Secondary | ICD-10-CM

## 2022-07-30 NOTE — Progress Notes (Signed)
This patient presents to the office for evaluation of callus on bottom outside of his left foot.  He has previously been seen for capsulitis and callus under the bone left foot.  He says there is pain at this visit.    he would like his nails to trimmed also.  The nails are painful walking and wearing his shoes.  He presents for evaluation and treatment.  General Appearance  Alert, conversant and in no acute stress.  Vascular  Dorsalis pedis and posterior tibial  pulses are  weakly palpable  bilaterally.  Capillary return is within normal limits  bilaterally. Temperature is within normal limits  bilaterally.  Neurologic  Senn-Weinstein monofilament wire test within normal limits  bilaterally. Muscle power within normal limits bilaterally.  Nails Thick disfigured discolored nails with subungual debris  hallux nails bilaterally. No evidence of bacterial infection or drainage bilaterally.  Orthopedic  No limitations of motion  feet .  No crepitus or effusions noted.  Tailors bunion fifth MPJ left foot.  Skin  normotropic skin  noted bilaterally.  No signs of infections or ulcers noted.  porokeratosis sub 5th met left foot  Onychomycosis  B/L  Porokeratosis sub 5th left foot.   Debrided his nails with nail nipper followed by dremel tool usage. Debride porokeratosis with dremel tool.  RTC 3   months  Helane Gunther DPM

## 2022-08-06 ENCOUNTER — Ambulatory Visit
Admission: RE | Admit: 2022-08-06 | Discharge: 2022-08-06 | Disposition: A | Discharge status: Home / Self Care | Payer: Medicare PPO | Source: Ambulatory Visit | Attending: Internal Medicine | Admitting: Internal Medicine

## 2022-08-06 DIAGNOSIS — N958 Other specified menopausal and perimenopausal disorders: Secondary | ICD-10-CM | POA: Diagnosis not present

## 2022-08-06 DIAGNOSIS — M810 Age-related osteoporosis without current pathological fracture: Secondary | ICD-10-CM

## 2022-09-14 DIAGNOSIS — E441 Mild protein-calorie malnutrition: Secondary | ICD-10-CM | POA: Diagnosis not present

## 2022-09-14 DIAGNOSIS — I700 Atherosclerosis of aorta: Secondary | ICD-10-CM | POA: Diagnosis not present

## 2022-09-14 DIAGNOSIS — Z23 Encounter for immunization: Secondary | ICD-10-CM | POA: Diagnosis not present

## 2022-09-14 DIAGNOSIS — R634 Abnormal weight loss: Secondary | ICD-10-CM | POA: Diagnosis not present

## 2022-09-14 DIAGNOSIS — D696 Thrombocytopenia, unspecified: Secondary | ICD-10-CM | POA: Diagnosis not present

## 2022-09-14 DIAGNOSIS — R739 Hyperglycemia, unspecified: Secondary | ICD-10-CM | POA: Diagnosis not present

## 2022-09-14 DIAGNOSIS — M4004 Postural kyphosis, thoracic region: Secondary | ICD-10-CM | POA: Diagnosis not present

## 2022-09-14 DIAGNOSIS — M810 Age-related osteoporosis without current pathological fracture: Secondary | ICD-10-CM | POA: Diagnosis not present

## 2022-09-14 DIAGNOSIS — K219 Gastro-esophageal reflux disease without esophagitis: Secondary | ICD-10-CM | POA: Diagnosis not present

## 2022-10-07 DIAGNOSIS — M546 Pain in thoracic spine: Secondary | ICD-10-CM | POA: Diagnosis not present

## 2022-10-07 DIAGNOSIS — M542 Cervicalgia: Secondary | ICD-10-CM | POA: Diagnosis not present

## 2022-10-13 DIAGNOSIS — M542 Cervicalgia: Secondary | ICD-10-CM | POA: Diagnosis not present

## 2022-10-13 DIAGNOSIS — M546 Pain in thoracic spine: Secondary | ICD-10-CM | POA: Diagnosis not present

## 2022-10-19 ENCOUNTER — Ambulatory Visit: Payer: Medicare PPO | Admitting: Podiatry

## 2022-10-19 ENCOUNTER — Encounter: Payer: Self-pay | Admitting: Podiatry

## 2022-10-19 DIAGNOSIS — M21622 Bunionette of left foot: Secondary | ICD-10-CM | POA: Diagnosis not present

## 2022-10-19 DIAGNOSIS — M7752 Other enthesopathy of left foot: Secondary | ICD-10-CM | POA: Diagnosis not present

## 2022-10-19 DIAGNOSIS — L84 Corns and callosities: Secondary | ICD-10-CM | POA: Diagnosis not present

## 2022-10-20 DIAGNOSIS — M542 Cervicalgia: Secondary | ICD-10-CM | POA: Diagnosis not present

## 2022-10-20 DIAGNOSIS — M546 Pain in thoracic spine: Secondary | ICD-10-CM | POA: Diagnosis not present

## 2022-10-20 NOTE — Progress Notes (Signed)
Subjective:   Patient ID: Tony Prince, male   DOB: 89 y.o.   MRN: [id number]   HPI Patient presents with very flat feet and chronic lesion around the fifth metatarsal head left over right that becomes painful and states has had orthotics in the past and needs new inserts   ROS      Objective:  Physical Exam  Neurovascular status intact with patient found to have significant flatfoot deformity bilateral with patient noted to have a keratotic lesion around the fifth metatarsal head left that is painful when pressed but not to the degree that it has been previously     Assessment:  See thickened structural deformity bilateral along with keratotic lesion and pressure against the fifth metatarsal head left over right     Plan:  H&P reviewed condition courtesy debridement done and I recommended orthotics to offload weight off the fifth MPJ head and this was done by pedorthist with proper casting technique and patient will be seen back when returned all questions answered today

## 2022-10-26 DIAGNOSIS — M542 Cervicalgia: Secondary | ICD-10-CM | POA: Diagnosis not present

## 2022-10-26 DIAGNOSIS — M546 Pain in thoracic spine: Secondary | ICD-10-CM | POA: Diagnosis not present

## 2022-10-30 ENCOUNTER — Encounter: Payer: Self-pay | Admitting: Podiatry

## 2022-10-30 ENCOUNTER — Ambulatory Visit: Payer: Medicare PPO | Admitting: Podiatry

## 2022-10-30 DIAGNOSIS — B351 Tinea unguium: Secondary | ICD-10-CM

## 2022-10-30 DIAGNOSIS — M79675 Pain in left toe(s): Secondary | ICD-10-CM

## 2022-10-30 DIAGNOSIS — M79674 Pain in right toe(s): Secondary | ICD-10-CM | POA: Diagnosis not present

## 2022-10-30 NOTE — Progress Notes (Signed)
This patient presents to the office for evaluation of callus on bottom outside of his left foot.  He has previously been seen for capsulitis and callus under the bone left foot.  He says there is pain at this visit.    he would like his nails to trimmed also.  The nails are painful walking and wearing his shoes.  He presents for evaluation and treatment.  General Appearance  Alert, conversant and in no acute stress.  Vascular  Dorsalis pedis and posterior tibial  pulses are  weakly palpable  bilaterally.  Capillary return is within normal limits  bilaterally. Temperature is within normal limits  bilaterally.  Neurologic  Senn-Weinstein monofilament wire test within normal limits  bilaterally. Muscle power within normal limits bilaterally.  Nails Thick disfigured discolored nails with subungual debris  hallux nails bilaterally. No evidence of bacterial infection or drainage bilaterally.  Orthopedic  No limitations of motion  feet .  No crepitus or effusions noted.  Tailors bunion fifth MPJ left foot.  Skin  normotropic skin  noted bilaterally.  No signs of infections or ulcers noted.  Onychomycosis  B/L     Debrided his nails with nail nipper followed by dremel tool usage.  RTC 3   months  Helane Gunther DPM

## 2022-11-03 DIAGNOSIS — M542 Cervicalgia: Secondary | ICD-10-CM | POA: Diagnosis not present

## 2022-11-03 DIAGNOSIS — M546 Pain in thoracic spine: Secondary | ICD-10-CM | POA: Diagnosis not present

## 2022-11-10 DIAGNOSIS — M546 Pain in thoracic spine: Secondary | ICD-10-CM | POA: Diagnosis not present

## 2022-11-10 DIAGNOSIS — M542 Cervicalgia: Secondary | ICD-10-CM | POA: Diagnosis not present

## 2022-11-11 ENCOUNTER — Ambulatory Visit: Payer: Medicare PPO

## 2022-11-11 NOTE — Progress Notes (Signed)
Patient presents today to pick up custom molded foot orthotics, diagnosed with plantar fasciitis Capsulitis by Dr. Charlsie Merles.   Orthotics were dispensed and fit was satisfactory. Reviewed instructions for break-in and wear. Written instructions given to patient.  Patient will follow up as needed.   Addison Bailey Cped, CFo,CFm

## 2022-11-16 DIAGNOSIS — M546 Pain in thoracic spine: Secondary | ICD-10-CM | POA: Diagnosis not present

## 2022-11-16 DIAGNOSIS — M542 Cervicalgia: Secondary | ICD-10-CM | POA: Diagnosis not present

## 2022-11-24 DIAGNOSIS — Z8551 Personal history of malignant neoplasm of bladder: Secondary | ICD-10-CM | POA: Diagnosis not present

## 2022-11-24 DIAGNOSIS — C678 Malignant neoplasm of overlapping sites of bladder: Secondary | ICD-10-CM | POA: Diagnosis not present

## 2022-11-24 DIAGNOSIS — R8289 Other abnormal findings on cytological and histological examination of urine: Secondary | ICD-10-CM | POA: Diagnosis not present

## 2022-11-30 DIAGNOSIS — M542 Cervicalgia: Secondary | ICD-10-CM | POA: Diagnosis not present

## 2022-11-30 DIAGNOSIS — M546 Pain in thoracic spine: Secondary | ICD-10-CM | POA: Diagnosis not present

## 2022-12-08 DIAGNOSIS — M546 Pain in thoracic spine: Secondary | ICD-10-CM | POA: Diagnosis not present

## 2022-12-08 DIAGNOSIS — M542 Cervicalgia: Secondary | ICD-10-CM | POA: Diagnosis not present

## 2022-12-16 DIAGNOSIS — M546 Pain in thoracic spine: Secondary | ICD-10-CM | POA: Diagnosis not present

## 2022-12-16 DIAGNOSIS — M542 Cervicalgia: Secondary | ICD-10-CM | POA: Diagnosis not present

## 2022-12-21 DIAGNOSIS — M542 Cervicalgia: Secondary | ICD-10-CM | POA: Diagnosis not present

## 2022-12-21 DIAGNOSIS — M546 Pain in thoracic spine: Secondary | ICD-10-CM | POA: Diagnosis not present

## 2022-12-30 DIAGNOSIS — M542 Cervicalgia: Secondary | ICD-10-CM | POA: Diagnosis not present

## 2022-12-30 DIAGNOSIS — M546 Pain in thoracic spine: Secondary | ICD-10-CM | POA: Diagnosis not present

## 2023-01-20 DIAGNOSIS — M542 Cervicalgia: Secondary | ICD-10-CM | POA: Diagnosis not present

## 2023-01-20 DIAGNOSIS — M546 Pain in thoracic spine: Secondary | ICD-10-CM | POA: Diagnosis not present

## 2023-02-01 ENCOUNTER — Ambulatory Visit: Payer: Medicare PPO | Admitting: Podiatry

## 2023-02-01 ENCOUNTER — Encounter: Payer: Self-pay | Admitting: Podiatry

## 2023-02-01 DIAGNOSIS — M79675 Pain in left toe(s): Secondary | ICD-10-CM

## 2023-02-01 DIAGNOSIS — M79674 Pain in right toe(s): Secondary | ICD-10-CM | POA: Diagnosis not present

## 2023-02-01 DIAGNOSIS — B351 Tinea unguium: Secondary | ICD-10-CM

## 2023-02-01 NOTE — Progress Notes (Signed)
This patient presents to the office for evaluation of callus on bottom outside of his left foot.  He has previously been seen for capsulitis and callus under the bone left foot.  He says there is pain at this visit.    he would like his nails to trimmed also.  The nails are painful walking and wearing his shoes.  He presents for evaluation and treatment.  General Appearance  Alert, conversant and in no acute stress.  Vascular  Dorsalis pedis and posterior tibial  pulses are  weakly palpable  bilaterally.  Capillary return is within normal limits  bilaterally. Temperature is within normal limits  bilaterally.  Neurologic  Senn-Weinstein monofilament wire test within normal limits  bilaterally. Muscle power within normal limits bilaterally.  Nails Thick disfigured discolored nails with subungual debris  hallux nails bilaterally. No evidence of bacterial infection or drainage bilaterally.  Orthopedic  No limitations of motion  feet .  No crepitus or effusions noted.  Tailors bunion fifth MPJ left foot.  Skin  normotropic skin  noted bilaterally.  No signs of infections or ulcers noted.  Onychomycosis  B/L     Debrided his nails with nail nipper followed by dremel tool usage.  RTC 3   months  Helane Gunther DPM

## 2023-02-23 DIAGNOSIS — Z9181 History of falling: Secondary | ICD-10-CM | POA: Diagnosis not present

## 2023-02-23 DIAGNOSIS — R739 Hyperglycemia, unspecified: Secondary | ICD-10-CM | POA: Diagnosis not present

## 2023-02-23 DIAGNOSIS — Z1331 Encounter for screening for depression: Secondary | ICD-10-CM | POA: Diagnosis not present

## 2023-02-23 DIAGNOSIS — R0609 Other forms of dyspnea: Secondary | ICD-10-CM | POA: Diagnosis not present

## 2023-02-23 DIAGNOSIS — E441 Mild protein-calorie malnutrition: Secondary | ICD-10-CM | POA: Diagnosis not present

## 2023-02-23 DIAGNOSIS — Z0000 Encounter for general adult medical examination without abnormal findings: Secondary | ICD-10-CM | POA: Diagnosis not present

## 2023-02-23 DIAGNOSIS — R141 Gas pain: Secondary | ICD-10-CM | POA: Diagnosis not present

## 2023-02-23 DIAGNOSIS — R634 Abnormal weight loss: Secondary | ICD-10-CM | POA: Diagnosis not present

## 2023-02-23 DIAGNOSIS — K219 Gastro-esophageal reflux disease without esophagitis: Secondary | ICD-10-CM | POA: Diagnosis not present

## 2023-02-23 DIAGNOSIS — R946 Abnormal results of thyroid function studies: Secondary | ICD-10-CM | POA: Diagnosis not present

## 2023-02-23 DIAGNOSIS — M810 Age-related osteoporosis without current pathological fracture: Secondary | ICD-10-CM | POA: Diagnosis not present

## 2023-02-23 DIAGNOSIS — M4004 Postural kyphosis, thoracic region: Secondary | ICD-10-CM | POA: Diagnosis not present

## 2023-02-23 DIAGNOSIS — I700 Atherosclerosis of aorta: Secondary | ICD-10-CM | POA: Diagnosis not present

## 2023-02-25 DIAGNOSIS — M1990 Unspecified osteoarthritis, unspecified site: Secondary | ICD-10-CM | POA: Diagnosis not present

## 2023-02-25 DIAGNOSIS — R030 Elevated blood-pressure reading, without diagnosis of hypertension: Secondary | ICD-10-CM | POA: Diagnosis not present

## 2023-02-25 DIAGNOSIS — L309 Dermatitis, unspecified: Secondary | ICD-10-CM | POA: Diagnosis not present

## 2023-02-25 DIAGNOSIS — F419 Anxiety disorder, unspecified: Secondary | ICD-10-CM | POA: Diagnosis not present

## 2023-02-25 DIAGNOSIS — Z87891 Personal history of nicotine dependence: Secondary | ICD-10-CM | POA: Diagnosis not present

## 2023-02-25 DIAGNOSIS — R32 Unspecified urinary incontinence: Secondary | ICD-10-CM | POA: Diagnosis not present

## 2023-02-25 DIAGNOSIS — L400 Psoriasis vulgaris: Secondary | ICD-10-CM | POA: Diagnosis not present

## 2023-02-25 DIAGNOSIS — Z8249 Family history of ischemic heart disease and other diseases of the circulatory system: Secondary | ICD-10-CM | POA: Diagnosis not present

## 2023-02-25 DIAGNOSIS — R636 Underweight: Secondary | ICD-10-CM | POA: Diagnosis not present

## 2023-04-22 NOTE — Progress Notes (Unsigned)
Cardiology Office Note:    Date:  04/26/2023   ID:  Tony Prince, DOB Jan 14, 1933, MRN [id number]  PCP:  Tony Aspen, MD   [facility] Providers Cardiologist:  None     Referring MD: Tony Prince, *   Chief Complaint  Patient presents with   Shortness of Breath    History of Present Illness:    Tony Prince is a 89 y.o. male is seen at the request of Tony Prince for evaluation of dyspnea on exertion. He was seen by Tony Prince remotely in 2019 with DOE. Echo showed mild pulmonary HTN but was otherwise normal. Myoview was normal.   The patient states he has had DOE for decades but that it has progressed over the past year to the point where any activity makes him short of breath- even brushing his teeth. If he tries to do anything quickly he gets really SOB. If he is able to take it more slowly he does better. He has some vague chest pain but attributes this to muscle soreness. He had RUL lobectomy 10 years ago for infectious process. He has a history of bladder CA cleared with BCG treatments. He did have recent PT for kyphosis but this didn't really help. He has always been skinny. No cough or edema. [facility] but hasn't been since the beginning of the pandemic.   Past Medical History:  Diagnosis Date   Allergic rhinitis    Anxiety    Arthritis    degenerative joints//  neck   Bladder cancer [facility]) urologist-  Tony Tony Prince   invasive high grade carcinoma  per pathology 02-07-2015   BPH (benign prostatic hyperplasia)    Chronically dry eyes    COPD (chronic obstructive pulmonary disease) (HCC)    Exertional shortness of breath    GERD (gastroesophageal reflux disease)    no meds   Heart murmur    2014 told that he has murmur by Tony. Pete Prince //  per echo 2011  no MVP   Hiatal hernia    History of lung abscess    PULMONARY NECROSIS  S/P  RIGHT UPPER LOBECTOMY  09-14-2012   Left inguinal hernia    Lower urinary  tract symptoms (LUTS)    Osteoporosis    Positional vertigo    Psoriasis    Sebaceous cyst    left shoulder   Thrombocytopenia (HCC)    MILD    Past Surgical History:  Procedure Laterality Date   APPENDECTOMY  1945   CARDIOVASCULAR STRESS TEST  08-31-2012  Tony Prince   Essentialy normal perfusion study, low risk/  normal LV function and wall motion, ef 89%   CATARACT EXTRACTION W/ INTRAOCULAR LENS  IMPLANT, BILATERAL     LOBECTOMY Right 09/14/2012   Procedure: LOBECTOMY;  Surgeon: Tony Slot, MD;  Location: [facility] OR;  Service: Thoracic;  Laterality: Right;   NEGATIVE SLEEP STUDY  2014 per pt   THORACOTOMY/LOBECTOMY Right 09/14/2012   Procedure: THORACOTOMY/LOBECTOMY;  Surgeon: Tony Slot, MD;  Location: [facility] OR;  Service: Thoracic;  Laterality: Right;   TONSILLECTOMY  as child   TRANSTHORACIC ECHOCARDIOGRAM  10-10-2009   grade 1 diastolic dysfunction,  ef 71%/  mild TR and PR/  aortic valve sclerotic but opens well/     TRANSURETHRAL RESECTION OF BLADDER TUMOR N/A 04/04/2015   Procedure: CYSTOSCOPY WITH BLADDER TUMOR BIOPSIES;  Surgeon: Tony Matar, MD;  Location: [facility];  Service: Urology;  Laterality: N/A;   TRANSURETHRAL RESECTION OF BLADDER TUMOR WITH GYRUS (TURBT-GYRUS) N/A 02/07/2015   Procedure: TRANSURETHRAL RESECTION OF BLADDER TUMOR WITH GYRUS (TURBT-GYRUS) LOOP;  Surgeon: Tony Matar, MD;  Location: [facility];  Service: Urology;  Laterality: N/A;   TRANSURETHRAL RESECTION OF PROSTATE  1998   VIDEO ASSISTED THORACOSCOPY (VATS)/WEDGE RESECTION Right 09/14/2012   Procedure: VIDEO ASSISTED THORACOSCOPY (VATS)/WEDGE RESECTION;  Surgeon: Tony Slot, MD;  Location: [facility] OR;  Service: Thoracic;  Laterality: Right;  possible chest wall resection    Current Medications: Current Meds  Medication Sig   alendronate (FOSAMAX) 70 MG tablet 1 TABLET 30 MINUTES BEFORE THE FIRST FOOD, BEVERAGE OR MEDICINE OF THE DAY  WITH PLAIN WATER WEEKLY ORALLY 90   Calcium Citrate-Vitamin D 315-250 MG-UNIT TABS Take by mouth daily.   Colloidal Oatmeal 1 % CREA Apply 1 application topically daily as needed (eczema).   fish oil-omega-3 fatty acids 1000 MG capsule Take 1 g by mouth daily. Reported on 04/23/2015   Hydrocortisone Butyr Lipo Base 0.1 % CREA Apply 1 application topically as needed. psoriasis   LORazepam (ATIVAN) 0.5 MG tablet Take 0.5 mg by mouth at bedtime.    Meth-Hyo-M Bl-Na Phos-Ph Sal (URO-MP) 118 MG CAPS Take 1 capsule by mouth every 6 (six) hours as needed.   Polyethyl Glycol-Propyl Glycol 0.4-0.3 % SOLN Apply 1 drop to eye 2 (two) times daily as needed (dry eyes).   polyethylene glycol (MIRALAX / GLYCOLAX) packet Take 17 g by mouth daily as needed for mild constipation.    triamcinolone (KENALOG) topical spray Apply topically 2 (two) times a week. 1 application     Allergies:   Moxifloxacin and Sulfa antibiotics   Social History   Socioeconomic History   Marital status: Widowed    Spouse name: Not on file   Number of children: 2   Years of education: Not on file   Highest education level: Not on file  Occupational History   Occupation: professor phyloscopy and religion    Comment: at [employer]  Tobacco Use   Smoking status: Former    Types: Pipe    Quit date: 03/23/1978    Years since quitting: 45.1   Smokeless tobacco: Never  Substance and Sexual Activity   Alcohol use: Yes    Comment: VERY RARE   Drug use: No   Sexual activity: Not Currently    Partners: Female    Comment: WIDOWED  Other Topics Concern   Not on file  Social History Narrative   Not on file   Social Drivers of Health   Financial Resource Strain: Not on file  Food Insecurity: Not on file  Transportation Needs: Not on file  Physical Activity: Not on file  Stress: Not on file  Social Connections: Not on file     Family History: The patient's family history includes Colon polyps in his mother; Crohn's  disease in his daughter; Diabetes in his father; Heart disease in his brother, brother, and father; Hyperlipidemia in his father; Hypertension in his father.  ROS:   Please see the history of present illness.     All other systems reviewed and are negative.  EKGs/Labs/Other Studies Reviewed:    The following studies were reviewed today:  EKG Interpretation Date/Time:  Monday April 26 2023 14:34:59 EST Ventricular Rate:  53 PR Interval:  166 QRS Duration:  134 QT Interval:  472 QTC Calculation: 442 R Axis:   -66  Text Interpretation: Sinus bradycardia  Right bundle branch block Left anterior fascicular block Bifascicular block Septal infarct , age undetermined When compared with ECG of 12-Sep-2012 10:23, (RBBB and left anterior fascicular block) is now Present Confirmed by [place], Danaisha Celli [phone]) on 04/26/2023 2:39:16 PM   EKG Interpretation Date/Time:  Monday April 26 2023 14:34:59 EST Ventricular Rate:  53 PR Interval:  166 QRS Duration:  134 QT Interval:  472 QTC Calculation: 442 R Axis:   -66  Text Interpretation: Sinus bradycardia Right bundle branch block Left anterior fascicular block Bifascicular block Septal infarct , age undetermined When compared with ECG of 12-Sep-2012 10:23, (RBBB and left anterior fascicular block) is now Present Confirmed by [place], Raizel Wesolowski [phone]) on 04/26/2023 2:39:16 PM    Recent Labs: No results found for requested labs within last 365 days.  Recent Lipid Panel    Component Value Date/Time   CHOL 170 08/04/2017 0841   TRIG 59 08/04/2017 0841   HDL 63 08/04/2017 0841   CHOLHDL 2.7 08/04/2017 0841   LDLCALC 95 08/04/2017 0841  Dated 02/23/23: normal CBC. BUN 46, creatinine 0.91. otherwise CMET and TSH normal.    Risk Assessment/Calculations:       Physical Exam:    VS:  BP (!) 154/66   Pulse (!) 53   Ht 5\' 10"  (1.778 m)   Wt 127 lb (57.6 kg)   SpO2 99%   BMI 18.22 kg/m     Wt Readings from Last 3 Encounters:  04/26/23 127 lb  (57.6 kg)  09/03/17 133 lb 6.4 oz (60.5 kg)  08/13/17 133 lb (60.3 kg)     GEN:  Well nourished, thin in no acute distress HEENT: Normal NECK: No JVD; No carotid bruits LYMPHATICS: No lymphadenopathy CARDIAC: RRR, no murmurs, rubs, gallops RESPIRATORY:  Clear to auscultation without rales, wheezing or rhonchi  ABDOMEN: Soft, non-tender, non-distended MUSCULOSKELETAL:  No edema; No deformity  SKIN: Warm and dry NEUROLOGIC:  Alert and oriented x 3 PSYCHIATRIC:  Normal affect   ASSESSMENT:    1. Dyspnea on exertion    PLAN:    In order of problems listed above:  I suspect his DOE is multifactorial - likely more related to prior lobectomy and deconditioning. Possible component of COPD. Need to rule out significant CAD or LV dysfunction. Will update Echo to evaluate valves, LV function and pulmonary pressures. Arrange for coronary CTA. If above negative would consider pulmonary evaluation Bifascicular block with LAHB and RBBB. No evidence of AV block. Asymptomatic. Will observe.             Medication Adjustments/Labs and Tests Ordered: Current medicines are reviewed at length with the patient today.  Concerns regarding medicines are outlined above.  Orders Placed This Encounter  Procedures   CT CORONARY MORPH W/CTA COR W/SCORE W/CA W/CM &/OR WO/CM   Basic metabolic panel   Lipid panel   EKG 12-Lead   ECHOCARDIOGRAM COMPLETE   No orders of the defined types were placed in this encounter.   Patient Instructions  Medication Instructions:  Continue same medications   Lab Work: Bmet,lipid panel today   Testing/Procedures: Echo first available   Coronary CT will be scheduled after approved by insurance    Follow instructions on next page   Follow-Up: At [facility], you and your health needs are our priority.  As part of our continuing mission to provide you with exceptional heart care, we have created designated Provider Care Teams.  These Care Teams  include your primary Cardiologist (physician) and Advanced Practice  Providers (APPs -  Physician Assistants and Nurse Practitioners) who all work together to provide you with the care you need, when you need it.  We recommend signing up for the patient portal called "MyChart".  Sign up information is provided on this After Visit Summary.  MyChart is used to connect with patients for Virtual Visits (Telemedicine).  Patients are able to view lab/test results, encounter notes, upcoming appointments, etc.  Non-urgent messages can be sent to your provider as well.   To learn more about what you can do with MyChart, go to [email or web address].    Your next appointment:  After Test    Provider:  Dr.Ermalee Mealy       Your cardiac CT will be scheduled at one of the below locations:   [facility] [address] [place], [place] [address] [phone]  OR  [facility] [address] [place], [place] [address] [phone]  OR   [facility] [address] [place], [place] [address] [phone]  OR   [facility] [address] [place], [place] [address] [phone]  If scheduled at [facility], please arrive at the [facility] and Children's Entrance (Entrance C2) of [facility] 30 minutes prior to test start time. You can use the FREE valet parking offered at entrance C (encouraged to control the heart rate for the test)  Proceed to the [facility] Radiology Department (first floor) to check-in and test prep.  All radiology patients and guests should use entrance C2 at [facility], accessed from [facility], even though the hospital's physical address listed is [address].    If scheduled at [facility] or [facility], please arrive 15 mins early for check-in and test prep.  There is spacious  parking and easy access to the radiology department from the [facility] Heart [facility] Vascular entrance. Please enter here and check-in with the desk attendant.   If scheduled at [facility], please arrive 30 minutes early for check-in and test prep.  Please follow these instructions carefully (unless otherwise directed):  An IV will be required for this test and Nitroglycerin will be given.  Hold all erectile dysfunction medications at least 3 days (72 hrs) prior to test. (Ie viagra, cialis, sildenafil, tadalafil, etc)   On the Night Before the Test: Be sure to Drink plenty of water. Do not consume any caffeinated/decaffeinated beverages or chocolate 12 hours prior to your test. Do not take any antihistamines 12 hours prior to your test.   On the Day of the Test: Drink plenty of water until 1 hour prior to the test. Do not eat any food 1 hour prior to test. You may take your regular medications prior to the test.  Take metoprolol (Lopressor) two hours prior to test. If you take Furosemide/Hydrochlorothiazide/Spironolactone/Chlorthalidone, please HOLD on the morning of the test.         After the Test: Drink plenty of water. After receiving IV contrast, you may experience a mild flushed feeling. This is normal. On occasion, you may experience a mild rash up to 24 hours after the test. This is not dangerous. If this occurs, you can take Benadryl 25 mg, Zyrtec, Claritin, or Allegra and increase your fluid intake. (Patients taking Tikosyn should avoid Benadryl, and may take Zyrtec, Claritin, or Allegra) If you experience trouble breathing, this can be serious. If it is severe call  911 IMMEDIATELY. If it is mild, please call our office.  We will call to schedule your test 2-4 weeks out understanding that some insurance companies will need an authorization prior to the service being performed.   For more information and frequently asked questions, please visit our website :  [email or web address]  For non-scheduling related questions, please contact the cardiac imaging nurse navigator should you have any questions/concerns: Cardiac Imaging Nurse Navigators Direct Office Dial: [phone]   For scheduling needs, including cancellations and rescheduling, please call [place], [phone].           Signed, Jayme Cham [place], MD  04/26/2023 3:04 PM    [facility]

## 2023-04-26 ENCOUNTER — Encounter: Payer: Self-pay | Admitting: Cardiology

## 2023-04-26 ENCOUNTER — Ambulatory Visit: Payer: Medicare PPO | Attending: Cardiology | Admitting: Cardiology

## 2023-04-26 VITALS — BP 154/66 | HR 53 | Ht 70.0 in | Wt 127.0 lb

## 2023-04-26 DIAGNOSIS — R0609 Other forms of dyspnea: Secondary | ICD-10-CM

## 2023-04-26 NOTE — Patient Instructions (Addendum)
Medication Instructions:  Continue same medications   Lab Work: Bmet,lipid panel today   Testing/Procedures: Echo first available   Coronary CT will be scheduled after approved by insurance    Follow instructions on next page   Follow-Up: At [facility], you and your health needs are our priority.  As part of our continuing mission to provide you with exceptional heart care, we have created designated Provider Care Teams.  These Care Teams include your primary Cardiologist (physician) and Advanced Practice Providers (APPs -  Physician Assistants and Nurse Practitioners) who all work together to provide you with the care you need, when you need it.  We recommend signing up for the patient portal called "MyChart".  Sign up information is provided on this After Visit Summary.  MyChart is used to connect with patients for Virtual Visits (Telemedicine).  Patients are able to view lab/test results, encounter notes, upcoming appointments, etc.  Non-urgent messages can be sent to your provider as well.   To learn more about what you can do with MyChart, go to [email or web address].    Your next appointment:  After Test    Provider:  Dr.Jordan       Your cardiac CT will be scheduled at one of the below locations:   [facility] [address] [place], [place] [address] [phone]  OR  [facility] [address] [place], [place] [address] [phone]  OR   [facility] [address] [place], [place] [address] [phone]  OR   [facility] [address] [place], [place] [address] [phone]  If scheduled at [facility], please arrive at the [facility] and Children's Entrance (Entrance C2) of [facility] 30 minutes prior to test start time. You can use the FREE valet parking offered at entrance C (encouraged to control the heart rate for the test)   Proceed to the [facility] Radiology Department (first floor) to check-in and test prep.  All radiology patients and guests should use entrance C2 at [facility], accessed from [facility], even though the hospital's physical address listed is [address]    If scheduled at [facility] or [facility], please arrive 15 mins early for check-in and test prep.  There is spacious parking and easy access to the radiology department from the [facility] Heart and Vascular entrance. Please enter here and check-in with the desk attendant.   If scheduled at [facility], please arrive 30 minutes early for check-in and test prep.  Please follow these instructions carefully (unless otherwise directed):  An IV will be required for this test and Nitroglycerin will be given.  Hold all erectile dysfunction medications at least 3 days (72 hrs) prior to test. (Ie viagra, cialis, sildenafil, tadalafil, etc)   On the Night Before the Test: Be sure to Drink plenty of water. Do not consume any caffeinated/decaffeinated beverages or chocolate 12 hours prior to your test. Do not take any antihistamines 12 hours prior to your test.   On the Day of the Test: Drink plenty of water until 1 hour prior to the test. Do not eat any food 1 hour prior to test. You may take your regular medications prior to the test.  Take metoprolol (Lopressor) two hours prior to test. If you take Furosemide/Hydrochlorothiazide/Spironolactone/Chlorthalidone, please HOLD on the morning of the test.  After the Test: Drink plenty of water. After receiving IV contrast, you may experience a mild flushed feeling. This is normal. On occasion, you may experience a mild rash up to 24 hours after the test. This is not dangerous. If this occurs, you can take Benadryl 25 mg, Zyrtec, Claritin, or Allegra and increase your fluid intake. (Patients taking  Tikosyn should avoid Benadryl, and may take Zyrtec, Claritin, or Allegra) If you experience trouble breathing, this can be serious. If it is severe call 911 IMMEDIATELY. If it is mild, please call our office.  We will call to schedule your test 2-4 weeks out understanding that some insurance companies will need an authorization prior to the service being performed.   For more information and frequently asked questions, please visit our website : [email or web address]  For non-scheduling related questions, please contact the cardiac imaging nurse navigator should you have any questions/concerns: Cardiac Imaging Nurse Navigators Direct Office Dial: [phone]   For scheduling needs, including cancellations and rescheduling, please call [place], [phone].

## 2023-04-27 LAB — BASIC METABOLIC PANEL
BUN/Creatinine Ratio: 42 — ABNORMAL HIGH (ref 10–24)
BUN: 38 mg/dL — ABNORMAL HIGH (ref 8–27)
CO2: 24 mmol/L (ref 20–29)
Calcium: 9.4 mg/dL (ref 8.6–10.2)
Chloride: 102 mmol/L (ref 96–106)
Creatinine, Ser: 0.91 mg/dL (ref 0.76–1.27)
Glucose: 97 mg/dL (ref 70–99)
Potassium: 4.9 mmol/L (ref 3.5–5.2)
Sodium: 142 mmol/L (ref 134–144)
eGFR: 81 mL/min/{1.73_m2} (ref >59)

## 2023-04-27 LAB — LIPID PANEL
Chol/HDL Ratio: 2.7 {ratio} (ref 0.0–5.0)
Cholesterol, Total: 158 mg/dL (ref 100–199)
HDL: 58 mg/dL (ref >39)
LDL Chol Calc (NIH): 87 mg/dL (ref 0–99)
Triglycerides: 69 mg/dL (ref 0–149)
VLDL Cholesterol Cal: 13 mg/dL (ref 5–40)

## 2023-05-05 ENCOUNTER — Ambulatory Visit: Payer: Medicare PPO | Admitting: Podiatry

## 2023-05-12 ENCOUNTER — Ambulatory Visit: Payer: Medicare PPO | Admitting: Podiatry

## 2023-05-18 ENCOUNTER — Ambulatory Visit: Payer: Medicare PPO | Admitting: Podiatry

## 2023-05-18 ENCOUNTER — Encounter: Payer: Self-pay | Admitting: Podiatry

## 2023-05-18 DIAGNOSIS — M79674 Pain in right toe(s): Secondary | ICD-10-CM | POA: Diagnosis not present

## 2023-05-18 DIAGNOSIS — B351 Tinea unguium: Secondary | ICD-10-CM

## 2023-05-18 DIAGNOSIS — M79675 Pain in left toe(s): Secondary | ICD-10-CM

## 2023-05-18 NOTE — Progress Notes (Signed)
This patient presents to the office for evaluation of callus on bottom outside of his left foot.  He has previously been seen for capsulitis and callus under the bone left foot.  He says there is pain at this visit.    he would like his nails to trimmed also.  The nails are painful walking and wearing his shoes.  He presents for evaluation and treatment.  General Appearance  Alert, conversant and in no acute stress.  Vascular  Dorsalis pedis and posterior tibial  pulses are  weakly palpable  bilaterally.  Capillary return is within normal limits  bilaterally. Temperature is within normal limits  bilaterally.  Neurologic  Senn-Weinstein monofilament wire test within normal limits  bilaterally. Muscle power within normal limits bilaterally.  Nails Thick disfigured discolored nails with subungual debris  hallux nails bilaterally. No evidence of bacterial infection or drainage bilaterally.  Orthopedic  No limitations of motion  feet .  No crepitus or effusions noted.  Tailors bunion fifth MPJ left foot.  Skin  normotropic skin  noted bilaterally.  No signs of infections or ulcers noted.  Onychomycosis  B/L     Debrided his nails with nail nipper followed by dremel tool usage.  RTC 3   months  Helane Gunther DPM

## 2023-05-31 ENCOUNTER — Ambulatory Visit (HOSPITAL_COMMUNITY)
Admission: RE | Admit: 2023-05-31 | Discharge: 2023-05-31 | Disposition: A | Discharge status: Home / Self Care | Payer: Medicare PPO | Source: Ambulatory Visit | Attending: Cardiology | Admitting: Cardiology

## 2023-05-31 DIAGNOSIS — R0609 Other forms of dyspnea: Secondary | ICD-10-CM | POA: Diagnosis not present

## 2023-05-31 LAB — ECHOCARDIOGRAM COMPLETE
AR max vel: 2.85 cm2
AV Area VTI: 2.67 cm2
AV Area mean vel: 2.69 cm2
AV Mean grad: 3 mmHg
AV Peak grad: 5.7 mmHg
Ao pk vel: 1.19 m/s
Area-P 1/2: 3.72 cm2
MV M vel: 4.49 m/s
MV Peak grad: 80.6 mmHg
P 1/2 time: 882 ms
S' Lateral: 2.52 cm

## 2023-06-04 ENCOUNTER — Ambulatory Visit (HOSPITAL_COMMUNITY): Payer: Medicare PPO

## 2023-06-10 ENCOUNTER — Encounter (HOSPITAL_COMMUNITY): Payer: Self-pay

## 2023-06-11 ENCOUNTER — Telehealth (HOSPITAL_COMMUNITY): Payer: Self-pay | Admitting: *Deleted

## 2023-06-11 NOTE — Telephone Encounter (Signed)
 Attempted to call patient regarding upcoming cardiac CT appointment. Left message on voicemail with name and callback number  Larey Brick RN Navigator Cardiac Imaging [facility] [phone] Office [phone] Cell

## 2023-06-14 ENCOUNTER — Ambulatory Visit (HOSPITAL_COMMUNITY): Admission: RE | Admit: 2023-06-14 | Source: Ambulatory Visit

## 2023-06-25 ENCOUNTER — Telehealth (HOSPITAL_COMMUNITY): Payer: Self-pay | Admitting: *Deleted

## 2023-06-25 NOTE — Telephone Encounter (Signed)
 Reaching out to patient to offer assistance regarding upcoming cardiac imaging study; pt verbalizes understanding of appt date/time, parking situation and where to check in, pre-test NPO status, and verified current allergies; name and call back number provided for further questions should they arise  Larey Brick RN Navigator Cardiac Imaging [facility] [phone] office [phone] cell  Patient aware to arrive at 11:30 AM.

## 2023-06-25 NOTE — Telephone Encounter (Signed)
 Attempted to call patient regarding upcoming cardiac CT appointment. Left message on voicemail with name and callback number  Larey Brick RN Navigator Cardiac Imaging [facility] [phone] Office [phone] Cell

## 2023-06-28 ENCOUNTER — Ambulatory Visit (HOSPITAL_COMMUNITY)
Admission: RE | Admit: 2023-06-28 | Discharge: 2023-06-28 | Disposition: A | Discharge status: Home / Self Care | Source: Ambulatory Visit | Attending: Cardiology | Admitting: Cardiology

## 2023-06-28 DIAGNOSIS — R931 Abnormal findings on diagnostic imaging of heart and coronary circulation: Secondary | ICD-10-CM | POA: Diagnosis not present

## 2023-06-28 DIAGNOSIS — I2510 Atherosclerotic heart disease of native coronary artery without angina pectoris: Secondary | ICD-10-CM | POA: Insufficient documentation

## 2023-06-28 DIAGNOSIS — R0609 Other forms of dyspnea: Secondary | ICD-10-CM | POA: Diagnosis not present

## 2023-06-28 DIAGNOSIS — J984 Other disorders of lung: Secondary | ICD-10-CM | POA: Diagnosis not present

## 2023-06-28 MED ORDER — NITROGLYCERIN 0.4 MG SL SUBL
SUBLINGUAL_TABLET | SUBLINGUAL | Status: AC
  Filled 2023-06-28: qty 2

## 2023-06-28 MED ORDER — NITROGLYCERIN 0.4 MG SL SUBL
0.8000 mg | SUBLINGUAL_TABLET | Freq: Once | SUBLINGUAL | Status: AC
  Administered 2023-06-28: 0.8 mg via SUBLINGUAL

## 2023-06-28 MED ORDER — IOHEXOL 350 MG/ML SOLN
95.0000 mL | Freq: Once | INTRAVENOUS | Status: AC | PRN
  Administered 2023-06-28: 95 mL via INTRAVENOUS

## 2023-06-29 ENCOUNTER — Other Ambulatory Visit: Payer: Self-pay | Admitting: Internal Medicine

## 2023-06-29 ENCOUNTER — Ambulatory Visit (HOSPITAL_COMMUNITY)
Admission: RE | Admit: 2023-06-29 | Discharge: 2023-06-29 | Disposition: A | Discharge status: Home / Self Care | Source: Ambulatory Visit | Attending: Internal Medicine

## 2023-06-29 DIAGNOSIS — J984 Other disorders of lung: Secondary | ICD-10-CM | POA: Diagnosis not present

## 2023-06-29 DIAGNOSIS — R0609 Other forms of dyspnea: Secondary | ICD-10-CM | POA: Diagnosis not present

## 2023-06-29 DIAGNOSIS — R931 Abnormal findings on diagnostic imaging of heart and coronary circulation: Secondary | ICD-10-CM

## 2023-06-29 DIAGNOSIS — I2510 Atherosclerotic heart disease of native coronary artery without angina pectoris: Secondary | ICD-10-CM | POA: Diagnosis not present

## 2023-07-02 ENCOUNTER — Telehealth: Payer: Self-pay | Admitting: Cardiology

## 2023-07-02 ENCOUNTER — Other Ambulatory Visit: Payer: Self-pay

## 2023-07-02 MED ORDER — PREDNISONE 10 MG (21) PO TBPK: ORAL_TABLET | ORAL | 0 refills | Status: DC

## 2023-07-02 MED ORDER — PREDNISONE 10 MG (21) PO TBPK: ORAL_TABLET | ORAL | Status: DC

## 2023-07-02 NOTE — Telephone Encounter (Signed)
 Patient identification verified by 2 forms. [name], RN    Called and spoke to patient  Patient states:   -has rash located on left arm and right leg  -rash is itchy, red and flat   -rash is not spreading but is not going away   -CT was completed on Monday 4/7  -rash appeared same day as CT   -to his knowledge this is his first time having CT with contrast  -unsure if this is an allergy   -he has a rash from dye after completing CT scan   -OTC Claritin provides some relief but itching returns when medicine wears off  Patient denies:   -swelling of lips/tongue   -SOB/difficulty breathing   -GI upset/discomfort  Advised patient:   -OTC hydrocortisone cream   -follow up with PCP   -keep follow up with Dr. Swaziland 4/14 Informed patient message sent to Dr. [place] and pharmacy for input  Patient verbalized understanding, no questions at this time

## 2023-07-02 NOTE — Progress Notes (Signed)
 Cardiology Office Note:    Date:  07/05/2023   ID:  Tony Prince, DOB 1932-04-17, MRN [id number]  PCP:  Benedetta Bradley, MD   [facility] Providers Cardiologist:  None     Referring MD: Benedetta Bradley, *   No chief complaint on file.   History of Present Illness:    Tony Prince is a 89 y.o. male is seen for follow up of dyspnea on exertion. He was seen by Dr Loetta Ringer remotely in 2019 with DOE. Echo showed mild pulmonary HTN but was otherwise normal. Myoview was normal.   The patient states he has had DOE for decades but that it has progressed over the past year to the point where any activity makes him short of breath- even brushing his teeth. If he tries to do anything quickly he gets really SOB. If he is able to take it more slowly he does better. He has some vague chest pain but attributes this to muscle soreness. He had RUL lobectomy 10 years ago for infectious process. He has a history of bladder CA cleared with BCG treatments. He did have recent PT for kyphosis but this didn't really help. He has always been skinny. No cough or edema. [facility] but hasn't been since the beginning of the pandemic.   On our evaluation Echo was unremarkable. Coronary CTA showed findings c/w severe LAD stenosis with abnormal FFR. He is seen today to discuss results.  Unfortunately his daughter recently passed away from cancer. He has 2 grandsons age 37 and 38. This has been stressful. He still notes SOB. Also feels he has intermittent pain that he equates with his hiatal hernia. CT did show a large gastric diverticulum. He also notes tightening of his posterior neck muscles.  Past Medical History:  Diagnosis Date   Allergic rhinitis    Anxiety    Arthritis    degenerative joints//  neck   Bladder cancer [facility]) urologist-  dr Joie Narrow   invasive high grade carcinoma  per pathology 02-07-2015   BPH (benign prostatic hyperplasia)     Chronically dry eyes    COPD (chronic obstructive pulmonary disease) (HCC)    Exertional shortness of breath    GERD (gastroesophageal reflux disease)    no meds   Heart murmur    2014 told that he has murmur by Dr. Willye Harvey //  per echo 2011  no MVP   Hiatal hernia    History of lung abscess    PULMONARY NECROSIS  S/P  RIGHT UPPER LOBECTOMY  09-14-2012   Left inguinal hernia    Lower urinary tract symptoms (LUTS)    Osteoporosis    Positional vertigo    Psoriasis    Sebaceous cyst    left shoulder   Thrombocytopenia (HCC)    MILD    Past Surgical History:  Procedure Laterality Date   APPENDECTOMY  1945   CARDIOVASCULAR STRESS TEST  08-31-2012  dr Jacquelynn Matter   Essentialy normal perfusion study, low risk/  normal LV function and wall motion, ef 89%   CATARACT EXTRACTION W/ INTRAOCULAR LENS  IMPLANT, BILATERAL     LOBECTOMY Right 09/14/2012   Procedure: LOBECTOMY;  Surgeon: Zelphia Higashi, MD;  Location: [facility] OR;  Service: Thoracic;  Laterality: Right;   NEGATIVE SLEEP STUDY  2014 per pt   THORACOTOMY/LOBECTOMY Right 09/14/2012   Procedure: THORACOTOMY/LOBECTOMY;  Surgeon: Zelphia Higashi, MD;  Location: [facility] OR;  Service:  Thoracic;  Laterality: Right;   TONSILLECTOMY  as child   TRANSTHORACIC ECHOCARDIOGRAM  10-10-2009   grade 1 diastolic dysfunction,  ef 71%/  mild TR and PR/  aortic valve sclerotic but opens well/     TRANSURETHRAL RESECTION OF BLADDER TUMOR N/A 04/04/2015   Procedure: CYSTOSCOPY WITH BLADDER TUMOR BIOPSIES;  Surgeon: Trent Frizzle, MD;  Location: [facility];  Service: Urology;  Laterality: N/A;   TRANSURETHRAL RESECTION OF BLADDER TUMOR WITH GYRUS (TURBT-GYRUS) N/A 02/07/2015   Procedure: TRANSURETHRAL RESECTION OF BLADDER TUMOR WITH GYRUS (TURBT-GYRUS) LOOP;  Surgeon: Trent Frizzle, MD;  Location: [facility];  Service: Urology;  Laterality: N/A;   TRANSURETHRAL RESECTION OF PROSTATE  1998   VIDEO ASSISTED  THORACOSCOPY (VATS)/WEDGE RESECTION Right 09/14/2012   Procedure: VIDEO ASSISTED THORACOSCOPY (VATS)/WEDGE RESECTION;  Surgeon: Zelphia Higashi, MD;  Location: [facility] OR;  Service: Thoracic;  Laterality: Right;  possible chest wall resection    Current Medications: Current Meds  Medication Sig   alendronate (FOSAMAX) 70 MG tablet 1 TABLET 30 MINUTES BEFORE THE FIRST FOOD, BEVERAGE OR MEDICINE OF THE DAY WITH PLAIN WATER WEEKLY ORALLY 90   aspirin EC 81 MG tablet Take 1 tablet (81 mg total) by mouth daily. Swallow whole.   LORazepam (ATIVAN) 0.5 MG tablet Take 0.5 mg by mouth at bedtime.    predniSONE (STERAPRED UNI-PAK 21 TAB) 10 MG (21) TBPK tablet Take taper dose as directed   rosuvastatin (CRESTOR) 5 MG tablet Take 1 tablet (5 mg total) by mouth daily.   triamcinolone (KENALOG) topical spray Apply topically 2 (two) times a week. 1 application   [DISCONTINUED] isosorbide mononitrate (IMDUR) 30 MG 24 hr tablet Take 1 tablet (30 mg total) by mouth daily.     Allergies:   Moxifloxacin, Iodinated contrast media, and Sulfa antibiotics   Social History   Socioeconomic History   Marital status: Widowed    Spouse name: Not on file   Number of children: 2   Years of education: Not on file   Highest education level: Not on file  Occupational History   Occupation: professor phyloscopy and religion    Comment: at [employer]  Tobacco Use   Smoking status: Former    Types: Pipe    Quit date: 03/23/1978    Years since quitting: 45.3   Smokeless tobacco: Never  Substance and Sexual Activity   Alcohol use: Yes    Comment: VERY RARE   Drug use: No   Sexual activity: Not Currently    Partners: Female    Comment: WIDOWED  Other Topics Concern   Not on file  Social History Narrative   Not on file   Social Drivers of Health   Financial Resource Strain: Not on file  Food Insecurity: Not on file  Transportation Needs: Not on file  Physical Activity: Not on file  Stress: Not on file   Social Connections: Not on file     Family History: The patient's family history includes Colon polyps in his mother; Crohn's disease in his daughter; Diabetes in his father; Heart disease in his brother, brother, and father; Hyperlipidemia in his father; Hypertension in his father.  ROS:   Please see the history of present illness.     All other systems reviewed and are negative.  EKGs/Labs/Other Studies Reviewed:    The following studies were reviewed today: Echo 05/31/23: IMPRESSIONS     1. Left ventricular ejection fraction, by estimation, is 65 to 70%. Left  ventricular  ejection fraction by 3D volume is 69 %. The left ventricle has  normal function. Left ventricular endocardial border not optimally defined  to evaluate regional wall  motion. Left ventricular diastolic parameters were normal. The average  left ventricular global longitudinal strain is -18.5 %. The global  longitudinal strain is normal.   2. Right ventricular systolic function is normal. The right ventricular  size is normal.   3. The mitral valve is normal in structure. Mild to moderate mitral valve  regurgitation. No evidence of mitral stenosis.   4. The aortic valve is tricuspid. Aortic valve regurgitation is mild. No  aortic stenosis is present.      Coronary CTA 06/28/23: FINDINGS: Image quality: Good   Noise artifact is: Mild slab artifact   Coronary calcium score is 1313.   Coronary arteries: Normal coronary origins.  Right dominance.   Right Coronary Artery: Minimal plaque proximal RCA, <25% stenosis, mild plaque mid RCA, 25-49% stenosis. Moderate mixed plaque distal RCA, 50-69% stenosis.   Left Main Coronary Artery: Minimal mixed atherosclerotic plaque <25%.   Left Anterior Descending Coronary Artery: Moderate mixed plaque proximal LAD, 50-69% stenosis. In the proximal to mid LAD, there is a severe calcified stenosis, 70-99% stenosis. There is a serial mid LAD severe stenosis 70-99%.  Moderate mixed plaque distal LAD, 50-69% stenosis.   Left Circumflex Artery: Minimal mixed plaque in the mid LCx and proximal OM3, <25% stenosis.   Aorta: Normal size, 32 mm at the mid ascending aorta (level of the PA bifurcation) measured double oblique.   Aortic Valve: Trivial calcifications. AV calcium score 40.   Other findings:   Normal pulmonary vein drainage into the left atrium.   Normal left atrial appendage without thrombus.   Normal size of the pulmonary artery.   Severe biatrial dilation.   Please see separate report from [facility] for non-cardiac findings.   IMPRESSION: 1. Severe CAD in the proximal-mid LAD and mid LAD, 70-99% stenosis, CADRADS 4. CT FFR will be performed and reported separately.   2. Total plaque volume 934 mm3 which is 55th percentile for age- and sex-matched controls (calcified plaque 273 mm3; non-calcified plaque 661 mm3). TPV is extensive.   3. Coronary calcium score of 1313.   4. Normal coronary origins with right dominance. FINDINGS: 1. Left Main: Low likelihood of hemodynamic significance.   2. LAD: High likelihood of hemodynamic significance in the mid LAD, FFR 0.65, delta 0.15. Mid LAD dimension is approximately 3 mm at site of lesion with dense calcification. 3. LCX: Low likelihood of hemodynamic significance. 4. RCA: Low likelihood of hemodynamic significance.   IMPRESSION: 1. CT FFR analysis showed high likelihood of hemodynamically significant stenosis in the mid LAD.      Recent Labs: 04/26/2023: BUN 38; Creatinine, Ser 0.91; Potassium 4.9; Sodium 142  Recent Lipid Panel    Component Value Date/Time   CHOL 158 04/26/2023 1543   TRIG 69 04/26/2023 1543   HDL 58 04/26/2023 1543   CHOLHDL 2.7 04/26/2023 1543   LDLCALC 87 04/26/2023 1543  Dated 02/23/23: normal CBC. BUN 46, creatinine 0.91. otherwise CMET and TSH normal.    Risk Assessment/Calculations:       Physical Exam:    VS:  BP (!) 142/66 (BP  Location: Left Arm, Patient Position: Sitting, Cuff Size: Normal)   Pulse (!) 50   Ht 5\' 10"  (1.778 m)   Wt 128 lb 6.4 oz (58.2 kg)   SpO2 95%   BMI 18.42 kg/m  Wt Readings from Last 3 Encounters:  07/05/23 128 lb 6.4 oz (58.2 kg)  04/26/23 127 lb (57.6 kg)  09/03/17 133 lb 6.4 oz (60.5 kg)     GEN:  Well nourished, thin in no acute distress HEENT: Normal NECK: No JVD; No carotid bruits LYMPHATICS: No lymphadenopathy CARDIAC: RRR, no murmurs, rubs, gallops RESPIRATORY:  Clear to auscultation without rales, wheezing or rhonchi  ABDOMEN: Soft, non-tender, non-distended MUSCULOSKELETAL:  No edema; No deformity  SKIN: Warm and dry NEUROLOGIC:  Alert and oriented x 3 PSYCHIATRIC:  Normal affect   ASSESSMENT:    1. Dyspnea on exertion   2. Coronary artery disease of native artery of native heart with stable angina pectoris (HCC)   3. Hypercholesteremia     PLAN:    In order of problems listed above:  DOE. Coronary CTA indicates a significant stenosis in the mid LAD which is likely contributing to his symptoms. He also has prior lobectomy and deconditioning. Possible component of COPD. Fortunately his Echo looked good. We discussed management of his CAD. Options include medical therapy versus consideration for stenting of the LAD. He is concerned about potential medication side effects especially risks of bleeding on ASA. I informed him that if we were to consider stenting he would require DAPT for 6 months at least. After a good discussion we decided to initiate medical therapy at low dose to minimize any potential side effects. Will start with ASA 81 mg daily. Imdur 15 mg daily, Crestor 5 mg daily. Will follow up in 3 months with fasting lab. We also discussed the option of Cardiac Rehab and he is going to consider this. He is not a candidate for beta blocker therapy with low HR and bifascicular block. If symptoms worsen we can always consider option of PCI.  Bifascicular block  with LAHB and RBBB. No evidence of AV block. Asymptomatic. Will observe.             Medication Adjustments/Labs and Tests Ordered: Current medicines are reviewed at length with the patient today.  Concerns regarding medicines are outlined above.  Orders Placed This Encounter  Procedures   Basic metabolic panel with GFR   Lipid panel   Hepatic function panel   Meds ordered this encounter  Medications   aspirin EC 81 MG tablet    Sig: Take 1 tablet (81 mg total) by mouth daily. Swallow whole.   DISCONTD: isosorbide mononitrate (IMDUR) 30 MG 24 hr tablet    Sig: Take 1 tablet (30 mg total) by mouth daily.    Dispense:  30 tablet    Refill:  6   rosuvastatin (CRESTOR) 5 MG tablet    Sig: Take 1 tablet (5 mg total) by mouth daily.    Dispense:  30 tablet    Refill:  6   isosorbide mononitrate (IMDUR) 30 MG 24 hr tablet    Sig: Take 0.5 tablets (15 mg total) by mouth daily.    Dispense:  30 tablet    Refill:  6    There are no Patient Instructions on file for this visit.   Signed, Bookert Guzzi [place], MD  07/05/2023 10:50 AM    [facility]

## 2023-07-02 NOTE — Telephone Encounter (Signed)
 Spoke to patient he stated he cannot take Benadryl causes drowsiness.Dr.Jordan prescribed prednisone dose pac.Prescription sent to [facility] [address]

## 2023-07-02 NOTE — Telephone Encounter (Signed)
 Pt states that he has developed a rash since having CT on last Monday. Rash is on his arms and legs. Pt would like a c/b regarding this matter. Please advise

## 2023-07-05 ENCOUNTER — Ambulatory Visit (HOSPITAL_BASED_OUTPATIENT_CLINIC_OR_DEPARTMENT_OTHER): Admitting: Family

## 2023-07-05 ENCOUNTER — Ambulatory Visit: Attending: Cardiology | Admitting: Cardiology

## 2023-07-05 ENCOUNTER — Encounter: Payer: Self-pay | Admitting: Cardiology

## 2023-07-05 VITALS — BP 142/66 | HR 50 | Ht 70.0 in | Wt 128.4 lb

## 2023-07-05 DIAGNOSIS — E7800 Pure hypercholesterolemia, unspecified: Secondary | ICD-10-CM | POA: Diagnosis not present

## 2023-07-05 DIAGNOSIS — R0609 Other forms of dyspnea: Secondary | ICD-10-CM

## 2023-07-05 DIAGNOSIS — I25118 Atherosclerotic heart disease of native coronary artery with other forms of angina pectoris: Secondary | ICD-10-CM | POA: Diagnosis not present

## 2023-07-05 MED ORDER — ASPIRIN 81 MG PO TBEC: 81.0000 mg | DELAYED_RELEASE_TABLET | Freq: Every day | ORAL | Status: AC

## 2023-07-05 MED ORDER — ISOSORBIDE MONONITRATE ER 30 MG PO TB24: 15.0000 mg | ORAL_TABLET | Freq: Every day | ORAL | 6 refills | Status: DC

## 2023-07-05 MED ORDER — ROSUVASTATIN CALCIUM 5 MG PO TABS: 5.0000 mg | ORAL_TABLET | Freq: Every day | ORAL | 6 refills | Status: DC

## 2023-07-05 MED ORDER — ISOSORBIDE MONONITRATE ER 30 MG PO TB24: 30.0000 mg | ORAL_TABLET | Freq: Every day | ORAL | 6 refills | Status: DC

## 2023-07-05 NOTE — Patient Instructions (Signed)
 Medication Instructions:  Start Imdur 30 mg take 1/2 tablet ( 15 mg ) daily Start Aspirin 81 mg daily Start Crestor 5 mg daily Continue all other medications *If you need a refill on your cardiac medications before your next appointment, please call your pharmacy*  Lab Work: Have bmet,lipid and hepatic panels done 3 months  1 week before appointment  Testing/Procedures: None ordered  Follow-Up: At [facility], you and your health needs are our priority.  As part of our continuing mission to provide you with exceptional heart care, our providers are all part of one team.  This team includes your primary Cardiologist (physician) and Advanced Practice Providers or APPs (Physician Assistants and Nurse Practitioners) who all work together to provide you with the care you need, when you need it.  Your next appointment:  3 months    Provider:  Dr.Jordan   We recommend signing up for the patient portal called "MyChart".  Sign up information is provided on this After Visit Summary.  MyChart is used to connect with patients for Virtual Visits (Telemedicine).  Patients are able to view lab/test results, encounter notes, upcoming appointments, etc.  Non-urgent messages can be sent to your provider as well.   To learn more about what you can do with MyChart, go to [email or web address].         1st Floor: - Lobby - Registration  - Pharmacy  - Lab - Cafe  2nd Floor: - PV Lab - Diagnostic Testing (echo, CT, nuclear med)  3rd Floor: - Vacant  4th Floor: - TCTS (cardiothoracic surgery) - AFib Clinic - Structural Heart Clinic - Vascular Surgery  - Vascular Ultrasound  5th Floor: - HeartCare Cardiology (general and EP) - Clinical Pharmacy for coumadin, hypertension, lipid, weight-loss medications, and med management appointments    Valet parking services will be available as well.

## 2023-07-06 ENCOUNTER — Encounter: Payer: Self-pay | Admitting: Cardiology

## 2023-08-19 ENCOUNTER — Encounter: Payer: Self-pay | Admitting: Podiatry

## 2023-08-19 ENCOUNTER — Ambulatory Visit: Payer: Medicare PPO | Admitting: Podiatry

## 2023-08-19 DIAGNOSIS — B351 Tinea unguium: Secondary | ICD-10-CM | POA: Diagnosis not present

## 2023-08-19 DIAGNOSIS — M79675 Pain in left toe(s): Secondary | ICD-10-CM

## 2023-08-19 DIAGNOSIS — M79674 Pain in right toe(s): Secondary | ICD-10-CM

## 2023-08-19 NOTE — Progress Notes (Signed)
This patient presents to the office for evaluation of callus on bottom outside of his left foot.  He has previously been seen for capsulitis and callus under the bone left foot.  He says there is pain at this visit.    he would like his nails to trimmed also.  The nails are painful walking and wearing his shoes.  He presents for evaluation and treatment.  General Appearance  Alert, conversant and in no acute stress.  Vascular  Dorsalis pedis and posterior tibial  pulses are  weakly palpable  bilaterally.  Capillary return is within normal limits  bilaterally. Temperature is within normal limits  bilaterally.  Neurologic  Senn-Weinstein monofilament wire test within normal limits  bilaterally. Muscle power within normal limits bilaterally.  Nails Thick disfigured discolored nails with subungual debris  hallux nails bilaterally. No evidence of bacterial infection or drainage bilaterally.  Orthopedic  No limitations of motion  feet .  No crepitus or effusions noted.  Tailors bunion fifth MPJ left foot.  Skin  normotropic skin  noted bilaterally.  No signs of infections or ulcers noted.  Onychomycosis  B/L     Debrided his nails with nail nipper followed by dremel tool usage.  RTC 3   months  Helane Gunther DPM

## 2023-09-02 ENCOUNTER — Ambulatory Visit (HOSPITAL_COMMUNITY)
Admission: EM | Admit: 2023-09-02 | Discharge: 2023-09-02 | Disposition: A | Discharge status: Home / Self Care | Attending: Family Medicine | Admitting: Family Medicine

## 2023-09-02 ENCOUNTER — Encounter (HOSPITAL_COMMUNITY): Payer: Self-pay

## 2023-09-02 DIAGNOSIS — S51801A Unspecified open wound of right forearm, initial encounter: Secondary | ICD-10-CM

## 2023-09-02 NOTE — ED Triage Notes (Signed)
 Pt states lost his balance when putting on his pants and hit his rt arm on something. Skin tear noted with bruising to rt forearm. Bleeding controlled and bandage applied.

## 2023-09-02 NOTE — ED Provider Notes (Signed)
 [facility]    CSN: [id number] Arrival date & time: 09/02/23  0845      History   Chief Complaint Chief Complaint  Patient presents with   Laceration    HPI Tony Prince is a 89 y.o. male.   The patient reports falling while putting his pajamas on and tearing his forearm skin on the edge of furtniture. He used clotting powder to stop the bleed and placed a bandage. He is on blood thinners. He denies any light headedness, dizziness, chest pain, fatigue, trouble breathing, numbness or weakness. He did not hit his head or loose consciousness with the fall.   The history is provided by the patient.  Laceration   Past Medical History:  Diagnosis Date   Allergic rhinitis    Anxiety    Arthritis    degenerative joints//  neck   Bladder cancer [facility]) urologist-  dr Joie Narrow   invasive high grade carcinoma  per pathology 02-07-2015   BPH (benign prostatic hyperplasia)    Chronically dry eyes    COPD (chronic obstructive pulmonary disease) (HCC)    Exertional shortness of breath    GERD (gastroesophageal reflux disease)    no meds   Heart murmur    2014 told that he has murmur by Dr. Willye Harvey //  per echo 2011  no MVP   Hiatal hernia    History of lung abscess    PULMONARY NECROSIS  S/P  RIGHT UPPER LOBECTOMY  09-14-2012   Left inguinal hernia    Lower urinary tract symptoms (LUTS)    Osteoporosis    Positional vertigo    Psoriasis    Sebaceous cyst    left shoulder   Thrombocytopenia (HCC)    MILD    Patient Active Problem List   Diagnosis Date Noted   Malignant neoplasm of lateral wall of bladder (HCC) 02/07/2015   Dyspnea 10/11/2012   S/P thoracotomy 09/19/2012   S/P lobectomy of lung 09/19/2012   Hypoxemia 09/14/2012   Pulmonary necrosis RUL s/p lobectomy    Anxiety state 02/10/2007   IRITIS 02/10/2007   BRADYCARDIA, CHRONIC 02/10/2007   Allergic rhinitis 02/10/2007   Sedative, hypnotic or anxiolytic dependence (HCC) 11/19/2006   Disorder  of bone and cartilage 11/19/2006    Past Surgical History:  Procedure Laterality Date   APPENDECTOMY  1945   CARDIOVASCULAR STRESS TEST  08-31-2012  dr Jacquelynn Matter   Essentialy normal perfusion study, low risk/  normal LV function and wall motion, ef 89%   CATARACT EXTRACTION W/ INTRAOCULAR LENS  IMPLANT, BILATERAL     LOBECTOMY Right 09/14/2012   Procedure: LOBECTOMY;  Surgeon: Zelphia Higashi, MD;  Location: [facility] OR;  Service: Thoracic;  Laterality: Right;   NEGATIVE SLEEP STUDY  2014 per pt   THORACOTOMY/LOBECTOMY Right 09/14/2012   Procedure: THORACOTOMY/LOBECTOMY;  Surgeon: Zelphia Higashi, MD;  Location: [facility] OR;  Service: Thoracic;  Laterality: Right;   TONSILLECTOMY  as child   TRANSTHORACIC ECHOCARDIOGRAM  10-10-2009   grade 1 diastolic dysfunction,  ef 71%/  mild TR and PR/  aortic valve sclerotic but opens well/     TRANSURETHRAL RESECTION OF BLADDER TUMOR N/A 04/04/2015   Procedure: CYSTOSCOPY WITH BLADDER TUMOR BIOPSIES;  Surgeon: Trent Frizzle, MD;  Location: [facility];  Service: Urology;  Laterality: N/A;   TRANSURETHRAL RESECTION OF BLADDER TUMOR WITH GYRUS (TURBT-GYRUS) N/A 02/07/2015   Procedure: TRANSURETHRAL RESECTION OF BLADDER TUMOR WITH GYRUS (TURBT-GYRUS) LOOP;  Surgeon: Trent Frizzle, MD;  Location:  [facility];  Service: Urology;  Laterality: N/A;   TRANSURETHRAL RESECTION OF PROSTATE  1998   VIDEO ASSISTED THORACOSCOPY (VATS)/WEDGE RESECTION Right 09/14/2012   Procedure: VIDEO ASSISTED THORACOSCOPY (VATS)/WEDGE RESECTION;  Surgeon: Zelphia Higashi, MD;  Location: [facility] OR;  Service: Thoracic;  Laterality: Right;  possible chest wall resection       Home Medications    Prior to Admission medications   Medication Sig Start Date End Date Taking? Authorizing Provider  alendronate (FOSAMAX) 70 MG tablet 1 TABLET 30 MINUTES BEFORE THE FIRST FOOD, BEVERAGE OR MEDICINE OF THE DAY WITH PLAIN WATER  WEEKLY ORALLY 90     [provider]  aspirin  EC 81 MG tablet Take 1 tablet (81 mg total) by mouth daily. Swallow whole. 07/05/23   [place], Peter M, MD  isosorbide  mononitrate (IMDUR ) 30 MG 24 hr tablet Take 0.5 tablets (15 mg total) by mouth daily. 07/05/23   [place], Peter M, MD  LORazepam  (ATIVAN ) 0.5 MG tablet Take 0.5 mg by mouth at bedtime.     [provider]  rosuvastatin  (CRESTOR ) 5 MG tablet Take 1 tablet (5 mg total) by mouth daily. 07/05/23   [place], Peter M, MD  triamcinolone  (KENALOG ) topical spray Apply topically 2 (two) times a week. 1 application    [provider]    Family History Family History  Problem Relation Age of Onset   Hyperlipidemia Father    Hypertension Father    Diabetes Father    Heart disease Father    Colon polyps Mother    Heart disease Brother        #1   Heart disease Brother        #2    Crohn's disease Daughter     Social History Social History   Tobacco Use   Smoking status: Former    Types: Pipe    Quit date: 03/23/1978    Years since quitting: 45.4   Smokeless tobacco: Never  Vaping Use   Vaping status: Never Used  Substance Use Topics   Alcohol  use: Yes    Comment: VERY RARE   Drug use: No     Allergies   Moxifloxacin, Iodinated contrast media, and Sulfa  antibiotics   Review of Systems Review of Systems  Respiratory:  Negative for shortness of breath and wheezing.   Cardiovascular:  Negative for chest pain and palpitations.  Gastrointestinal:  Negative for nausea and vomiting.  Musculoskeletal:  Negative for myalgias.  Skin:  Positive for wound. Negative for pallor.  Neurological:  Negative for dizziness, syncope, weakness, light-headedness, numbness and headaches.     Physical Exam Triage Vital Signs ED Triage Vitals [09/02/23 0951]  Encounter Vitals Group     BP (!) 168/77     Girls Systolic BP Percentile      Girls Diastolic BP Percentile      Boys Systolic BP Percentile      Boys Diastolic BP Percentile       Pulse Rate (!) 52     Resp 18     Temp (!) 97.3 F (36.3 C)     Temp Source Oral     SpO2 96 %     Weight      Height      Head Circumference      Peak Flow      Pain Score 0     Pain Loc      Pain Education      Exclude from Growth Chart  No data found.  Updated Vital Signs BP (!) 168/77 (BP Location: Left Arm)   Pulse (!) 52   Temp (!) 97.3 F (36.3 C) (Oral)   Resp 18   SpO2 96%   Visual Acuity Right Eye Distance:   Left Eye Distance:   Bilateral Distance:    Right Eye Near:   Left Eye Near:    Bilateral Near:     Physical Exam Vitals reviewed.  Constitutional:      General: He is not in acute distress.    Appearance: He is not ill-appearing or diaphoretic.  HENT:     Head: Normocephalic and atraumatic.   Eyes:     Extraocular Movements: Extraocular movements intact.     Pupils: Pupils are equal, round, and reactive to light.    Cardiovascular:     Pulses: Normal pulses.  Pulmonary:     Effort: Pulmonary effort is normal.   Musculoskeletal:        General: No swelling, tenderness or deformity.     Cervical back: Normal range of motion and neck supple.     Right lower leg: No edema.     Left lower leg: No edema.     Comments: Right elbow and wrist w/ full ROM and strength   Skin:    Capillary Refill: Capillary refill takes 2 to 3 seconds.     Findings: Wound present.         Comments: 6cm irregularly bordered avulsion of the posterior mid forearm involving the epidermis  Hemostatic No evidence of deeper injury   Neurological:     General: No focal deficit present.     Mental Status: He is alert.     Sensory: No sensory deficit.     Motor: No weakness.     Gait: Gait normal.   Psychiatric:        Mood and Affect: Mood normal.        Behavior: Behavior normal.        Thought Content: Thought content normal.      UC Treatments / Results  Labs (all labs ordered are listed, but only abnormal results are displayed) Labs Reviewed  - No data to display  EKG   Radiology No results found.  Procedures Procedures (including critical care time)  Medications Ordered in UC Medications - No data to display  Initial Impression / Assessment and Plan / UC Course  I have reviewed the triage vital signs and the nursing notes.  Pertinent labs & imaging results that were available during my care of the patient were reviewed by me and considered in my medical decision making (see chart for details).     Superficial abrasion of the right forearm - The patient used anticoagulant powder at home which stopped the bleeding - His wound was irrigated and approximation of the flap was performed for wound coverage and healing - Bacitracin was applied and nonadherent gauze placed over the wound with gauze roll and Coban for compression - We discussed daily wound checking and monitoring for signs of infection as well as detailed wound care steps - The patient has an appointment with his PCP in 5 days - We discussed returning if he has any issues questions, or signs of infection. - The patient voiced understanding and agreement plan. Final Clinical Impressions(s) / UC Diagnoses   Final diagnoses:  Avulsion of skin of forearm, right, initial encounter     Discharge Instructions      Check the wound every  24 hours and change the bandage very carefully Remove the bandage carefully pulling it towards you Apply Vaseline/petroleum jelly over the open wound areas and then reapply a nonadherent bandage Wrap the bandage and surrounding skin with the rolled gauze Then apply the Coban to secure everything in place Watch for any signs of new bleeding, swelling, redness, hotness, or increased pain, fevers or chills Follow-up at your neck scheduled appointment on the 17th with your PCP for rechecking If you need assistance return to urgent care in the meantime for wound check.      ED Prescriptions   None    PDMP not reviewed  this encounter.   Claybon Cuna, MD 09/02/23 2105

## 2023-09-02 NOTE — Discharge Instructions (Addendum)
 Check the wound every 24 hours and change the bandage very carefully Remove the bandage carefully pulling it towards you Apply Vaseline/petroleum jelly over the open wound areas and then reapply a nonadherent bandage Wrap the bandage and surrounding skin with the rolled gauze Then apply the Coban to secure everything in place Watch for any signs of new bleeding, swelling, redness, hotness, or increased pain, fevers or chills Follow-up at your neck scheduled appointment on the 17th with your PCP for rechecking If you need assistance return to urgent care in the meantime for wound check.

## 2023-09-03 DIAGNOSIS — S41111A Laceration without foreign body of right upper arm, initial encounter: Secondary | ICD-10-CM | POA: Diagnosis not present

## 2023-09-03 DIAGNOSIS — Z03818 Encounter for observation for suspected exposure to other biological agents ruled out: Secondary | ICD-10-CM | POA: Diagnosis not present

## 2023-09-03 DIAGNOSIS — T148XXD Other injury of unspecified body region, subsequent encounter: Secondary | ICD-10-CM | POA: Diagnosis not present

## 2023-09-03 DIAGNOSIS — R0602 Shortness of breath: Secondary | ICD-10-CM | POA: Diagnosis not present

## 2023-09-03 DIAGNOSIS — R6883 Chills (without fever): Secondary | ICD-10-CM | POA: Diagnosis not present

## 2023-09-03 DIAGNOSIS — B349 Viral infection, unspecified: Secondary | ICD-10-CM | POA: Diagnosis not present

## 2023-09-07 DIAGNOSIS — B349 Viral infection, unspecified: Secondary | ICD-10-CM | POA: Diagnosis not present

## 2023-09-07 DIAGNOSIS — T148XXA Other injury of unspecified body region, initial encounter: Secondary | ICD-10-CM | POA: Diagnosis not present

## 2023-09-07 DIAGNOSIS — R141 Gas pain: Secondary | ICD-10-CM | POA: Diagnosis not present

## 2023-09-07 DIAGNOSIS — D649 Anemia, unspecified: Secondary | ICD-10-CM | POA: Diagnosis not present

## 2023-09-07 DIAGNOSIS — I2510 Atherosclerotic heart disease of native coronary artery without angina pectoris: Secondary | ICD-10-CM | POA: Diagnosis not present

## 2023-09-07 DIAGNOSIS — R946 Abnormal results of thyroid function studies: Secondary | ICD-10-CM | POA: Diagnosis not present

## 2023-09-07 DIAGNOSIS — R109 Unspecified abdominal pain: Secondary | ICD-10-CM | POA: Diagnosis not present

## 2023-09-13 ENCOUNTER — Ambulatory Visit (HOSPITAL_COMMUNITY)
Admission: EM | Admit: 2023-09-13 | Discharge: 2023-09-13 | Disposition: A | Discharge status: Home / Self Care | Attending: Nurse Practitioner | Admitting: Nurse Practitioner

## 2023-09-13 ENCOUNTER — Encounter (HOSPITAL_COMMUNITY): Payer: Self-pay

## 2023-09-13 DIAGNOSIS — Z5189 Encounter for other specified aftercare: Secondary | ICD-10-CM

## 2023-09-13 NOTE — Discharge Instructions (Addendum)
 You were seen today for a skin tear on your right arm that occurred 11 days ago. The wound looks healthy and is not infected, but the edges have not fully closed. You no longer need to apply Vaseline or wrap the wound daily. Steri-Strips have been applied to help pull the wound edges together. These should stay in place for 7-10 days and can be trimmed at the edges if they begin to curl. You may shower and get the area wet after 24 hours--just gently pat it dry afterward. Keep the wound uncovered unless you're doing something that could expose it to dirt or bacteria. A dry dressing may be used for the first day only. Watch for signs of infection such as increased redness, swelling, drainage, warmth, or a bad smell coming from the wound. If any of these occur, or if the wound worsens, return to urgent care. Please follow up with your primary care provider, Dr. Charlott, within one week to check the healing process. Call today to schedule the appointment. Seek emergency care if you develop a fever, spreading redness, or severe pain around the wound.

## 2023-09-13 NOTE — ED Provider Notes (Signed)
 [facility]    CSN: [id number] Arrival date & time: 09/13/23  0848      History   Chief Complaint Chief Complaint  Patient presents with   Wound Check    HPI Tony Prince is a 89 y.o. male.   Discussed the use of AI scribe software for clinical note transcription with the patient, who gave verbal consent to proceed.    Tony Prince is a 89 y.o. male  presents for follow-up of a skin tear on the right arm that occurred 11 days ago due to a fall. The patient is on blood thinners for heart issues and had been seen in this clinic on September 02, 2023, for the initial injury. The wound was irrigated and approximation of the flap was performed for wound coverage and healing. Bacitracin was applied and nonadherent gauze placed over the wound with gauze roll and Coban for compression. The patient reports that their son-in-law has been re-wrapping the wound daily and applying Vaseline as directed. The wound appears to be mostly healed, but there is a red edge that hasn't improved. The patient is seeking advice on whether the wound can be left open or if it needs to be wrapped differently. He had a follow-up appointment with their primary care provider on June 17, who stated the wound looked okay at that time. Three days after the primary care visit, the patient experienced chills for approximately 3.5 hours but denies having had a fever, body aches, or other symptoms. The patient has not noticed any signs of infection such as increased redness, swelling, drainage, or bad odor.   The following portions of the patient's history were reviewed and updated as appropriate: allergies, current medications, past family history, past medical history, past social history, past surgical history, and problem list.    Past Medical History:  Diagnosis Date   Allergic rhinitis    Anxiety    Arthritis    degenerative joints//  neck   Bladder cancer [facility]) urologist-  dr matilda   invasive  high grade carcinoma  per pathology 02-07-2015   BPH (benign prostatic hyperplasia)    Chronically dry eyes    COPD (chronic obstructive pulmonary disease) (HCC)    Exertional shortness of breath    GERD (gastroesophageal reflux disease)    no meds   Heart murmur    2014 told that he has murmur by Dr. Roseann //  per echo 2011  no MVP   Hiatal hernia    History of lung abscess    PULMONARY NECROSIS  S/P  RIGHT UPPER LOBECTOMY  09-14-2012   Left inguinal hernia    Lower urinary tract symptoms (LUTS)    Osteoporosis    Positional vertigo    Psoriasis    Sebaceous cyst    left shoulder   Thrombocytopenia (HCC)    MILD    Patient Active Problem List   Diagnosis Date Noted   Malignant neoplasm of lateral wall of bladder (HCC) 02/07/2015   Dyspnea 10/11/2012   S/P thoracotomy 09/19/2012   S/P lobectomy of lung 09/19/2012   Hypoxemia 09/14/2012   Pulmonary necrosis RUL s/p lobectomy    Anxiety state 02/10/2007   IRITIS 02/10/2007   BRADYCARDIA, CHRONIC 02/10/2007   Allergic rhinitis 02/10/2007   Sedative, hypnotic or anxiolytic dependence (HCC) 11/19/2006   Disorder of bone and cartilage 11/19/2006    Past Surgical History:  Procedure Laterality Date   APPENDECTOMY  1945   CARDIOVASCULAR STRESS TEST  08-31-2012  dr dann   Essentialy normal perfusion study, low risk/  normal LV function and wall motion, ef 89%   CATARACT EXTRACTION W/ INTRAOCULAR LENS  IMPLANT, BILATERAL     LOBECTOMY Right 09/14/2012   Procedure: LOBECTOMY;  Surgeon: Elspeth JAYSON Millers, MD;  Location: [facility] OR;  Service: Thoracic;  Laterality: Right;   NEGATIVE SLEEP STUDY  2014 per pt   THORACOTOMY/LOBECTOMY Right 09/14/2012   Procedure: THORACOTOMY/LOBECTOMY;  Surgeon: Elspeth JAYSON Millers, MD;  Location: [facility] OR;  Service: Thoracic;  Laterality: Right;   TONSILLECTOMY  as child   TRANSTHORACIC ECHOCARDIOGRAM  10-10-2009   grade 1 diastolic dysfunction,  ef 71%/  mild TR and PR/  aortic valve sclerotic  but opens well/     TRANSURETHRAL RESECTION OF BLADDER TUMOR N/A 04/04/2015   Procedure: CYSTOSCOPY WITH BLADDER TUMOR BIOPSIES;  Surgeon: Garnette Shack, MD;  Location: [facility];  Service: Urology;  Laterality: N/A;   TRANSURETHRAL RESECTION OF BLADDER TUMOR WITH GYRUS (TURBT-GYRUS) N/A 02/07/2015   Procedure: TRANSURETHRAL RESECTION OF BLADDER TUMOR WITH GYRUS (TURBT-GYRUS) LOOP;  Surgeon: Garnette Shack, MD;  Location[facility];  Service: Urology;  Laterality: N/A;   TRANSURETHRAL RESECTION OF PROSTATE  1998   VIDEO ASSISTED THORACOSCOPY (VATS)/WEDGE RESECTION Right 09/14/2012   Procedure: VIDEO ASSISTED THORACOSCOPY (VATS)/WEDGE RESECTION;  Surgeon: Elspeth JAYSON Millers, MD;  Location: [facility] OR;  Service: Thoracic;  Laterality: Right;  possible chest wall resection       Home Medications    Prior to Admission medications   Medication Sig Start Date End Date Taking? Authorizing Provider  alendronate (FOSAMAX) 70 MG tablet 1 TABLET 30 MINUTES BEFORE THE FIRST FOOD, BEVERAGE OR MEDICINE OF THE DAY WITH PLAIN WATER  WEEKLY ORALLY 90    [provider]  aspirin  EC 81 MG tablet Take 1 tablet (81 mg total) by mouth daily. Swallow whole. 07/05/23   [place], Peter M, MD  isosorbide  mononitrate (IMDUR ) 30 MG 24 hr tablet Take 0.5 tablets (15 mg total) by mouth daily. 07/05/23   [place], Peter M, MD  LORazepam  (ATIVAN ) 0.5 MG tablet Take 0.5 mg by mouth at bedtime.     [provider]  rosuvastatin  (CRESTOR ) 5 MG tablet Take 1 tablet (5 mg total) by mouth daily. 07/05/23   [place], Peter M, MD  triamcinolone  (KENALOG ) topical spray Apply topically 2 (two) times a week. 1 application    [provider]    Family History Family History  Problem Relation Age of Onset   Hyperlipidemia Father    Hypertension Father    Diabetes Father    Heart disease Father    Colon polyps Mother    Heart disease Brother        #1   Heart disease Brother         #2    Crohn's disease Daughter     Social History Social History   Tobacco Use   Smoking status: Former    Types: Pipe    Quit date: 03/23/1978    Years since quitting: 45.5   Smokeless tobacco: Never  Vaping Use   Vaping status: Never Used  Substance Use Topics   Alcohol  use: Yes    Comment: VERY RARE   Drug use: No     Allergies   Moxifloxacin, Iodinated contrast media, and Sulfa  antibiotics   Review of Systems Review of Systems  Constitutional:  Negative for fever.  Skin:  Positive for wound.  All other systems reviewed and are negative.  Physical Exam Triage Vital Signs ED Triage Vitals  Encounter Vitals Group     BP 09/13/23 0923 (!) 165/68     Girls Systolic BP Percentile --      Girls Diastolic BP Percentile --      Boys Systolic BP Percentile --      Boys Diastolic BP Percentile --      Pulse Rate 09/13/23 0923 (!) 56     Resp 09/13/23 0923 16     Temp 09/13/23 0923 97.7 F (36.5 C)     Temp Source 09/13/23 0923 Oral     SpO2 09/13/23 0923 96 %     Weight --      Height --      Head Circumference --      Peak Flow --      Pain Score 09/13/23 0926 0     Pain Loc --      Pain Education --      Exclude from Growth Chart --    No data found.  Updated Vital Signs BP (!) 165/68 (BP Location: Left Arm)   Pulse (!) 56   Temp 97.7 F (36.5 C) (Oral)   Resp 16   SpO2 96%   Visual Acuity Right Eye Distance:   Left Eye Distance:   Bilateral Distance:    Right Eye Near:   Left Eye Near:    Bilateral Near:     Physical Exam Vitals and nursing note reviewed.  Constitutional:      General: He is not in acute distress.    Appearance: Normal appearance. He is not toxic-appearing.  HENT:     Head: Normocephalic.     Mouth/Throat:     Mouth: Mucous membranes are moist.   Eyes:     Conjunctiva/sclera: Conjunctivae normal.    Cardiovascular:     Rate and Rhythm: Normal rate and regular rhythm.     Heart sounds: Normal heart  sounds.  Pulmonary:     Effort: Pulmonary effort is normal.     Breath sounds: Normal breath sounds.   Musculoskeletal:        General: Normal range of motion.   Skin:    General: Skin is warm and dry.     Findings: Wound present.     Comments: Right forearm reveals a healing wound without surrounding erythema, swelling, drainage, or odor. The wound bed appears pink and healthy. Wound edges are not fully approximated but show no signs of infection or dehiscence (see image below)    Neurological:     General: No focal deficit present.     Mental Status: He is alert and oriented to person, place, and time.      UC Treatments / Results  Labs (all labs ordered are listed, but only abnormal results are displayed) Labs Reviewed - No data to display  EKG   Radiology No results found.  Procedures Wound Care  Date/Time: 09/13/2023 9:58 AM  Performed by: Iola Lukes, FNP Authorized by: Iola Lukes, FNP   Consent:    Consent obtained:  Verbal   Consent given by:  Patient   Risks, benefits, and alternatives were discussed: yes     Risks discussed:  Infection and poor cosmetic result Universal protocol:    Patient identity confirmed:  Verbally with patient and arm band Sedation:    Sedation type:  None Anesthesia:    Anesthesia method:  None Procedure details:    Indications: open wounds     Wound location:  Arm   Shoulder/arm location:  R lower arm   Wound age (days):  13   Debridement performed: No   Skin layer closed with:    Wound care performed:  Staples applied Dressing:    Dressing applied:  Telfa pad   Wrapped with:  Conform bandage 4 inch Post-procedure details:    Procedure completion:  Tolerated well, no immediate complications  (including critical care time)  Medications Ordered in UC Medications - No data to display  Initial Impression / Assessment and Plan / UC Course  I have reviewed the triage vital signs and the nursing  notes.  Pertinent labs & imaging results that were available during my care of the patient were reviewed by me and considered in my medical decision making (see chart for details).     The patient presents with an 22-day-old skin tear on the right arm sustained after a fall. The wound has been managed at home with Vaseline and a nonadherent dressing applied daily by the patient's son-in-law. Examination shows no signs of infection, with healthy tissue present, though the wound edges remain slightly open and not fully approximated. The wound is consistent with delayed closure but otherwise healing appropriately. Treatment includes discontinuing Vaseline and daily dressing changes, and instead applying Steri-Strips to approximate wound edges. The Steri-Strips should remain in place for 7-10 days and may be trimmed if they begin to curl. A dry dressing may be used for the first 24 hours, after which the wound may remain open to air. The patient may shower and get the area wet after 24 hours, taking care to gently pat the area dry. The wound should only be covered during activities that could contaminate it. Education was provided on signs of infection such as increased redness, swelling, drainage, or foul odor. Follow-up with the patient's primary care provider, Dr. Charlott, is recommended within one week, and the patient was advised to call today to schedule the appointment. Return to urgent care sooner if the wound worsens or signs of infection develop.  Today's evaluation has revealed no signs of a dangerous process. Discussed diagnosis with patient and/or guardian. Patient and/or guardian aware of their diagnosis, possible red flag symptoms to watch out for and need for close follow up. Patient and/or guardian understands verbal and written discharge instructions. Patient and/or guardian comfortable with plan and disposition.  Patient and/or guardian has a clear mental status at this time, good insight  into illness (after discussion and teaching) and has clear judgment to make decisions regarding their care  Documentation was completed with the aid of voice recognition software. Transcription may contain typographical errors.  Final Clinical Impressions(s) / UC Diagnoses   Final diagnoses:  Encounter for wound re-check     Discharge Instructions      You were seen today for a skin tear on your right arm that occurred 11 days ago. The wound looks healthy and is not infected, but the edges have not fully closed. You no longer need to apply Vaseline or wrap the wound daily. Steri-Strips have been applied to help pull the wound edges together. These should stay in place for 7-10 days and can be trimmed at the edges if they begin to curl. You may shower and get the area wet after 24 hours--just gently pat it dry afterward. Keep the wound uncovered unless you're doing something that could expose it to dirt or bacteria. A dry dressing may be used for the first day only. Watch for signs of  infection such as increased redness, swelling, drainage, warmth, or a bad smell coming from the wound. If any of these occur, or if the wound worsens, return to urgent care. Please follow up with your primary care provider, Dr. Charlott, within one week to check the healing process. Call today to schedule the appointment. Seek emergency care if you develop a fever, spreading redness, or severe pain around the wound.      ED Prescriptions   None    PDMP not reviewed this encounter.   Iola Lukes, [place] 09/13/23 1012

## 2023-09-13 NOTE — ED Triage Notes (Signed)
 Patient here today for a follow up from a fall 11 days ago. Patient had a skin tear on his right forearm and states that it has not healed completely.

## 2023-09-21 DIAGNOSIS — R0609 Other forms of dyspnea: Secondary | ICD-10-CM | POA: Diagnosis not present

## 2023-09-21 DIAGNOSIS — E7800 Pure hypercholesterolemia, unspecified: Secondary | ICD-10-CM | POA: Diagnosis not present

## 2023-09-21 DIAGNOSIS — I25118 Atherosclerotic heart disease of native coronary artery with other forms of angina pectoris: Secondary | ICD-10-CM | POA: Diagnosis not present

## 2023-09-22 ENCOUNTER — Ambulatory Visit: Payer: Self-pay | Admitting: Cardiology

## 2023-09-22 DIAGNOSIS — K219 Gastro-esophageal reflux disease without esophagitis: Secondary | ICD-10-CM | POA: Diagnosis not present

## 2023-09-22 DIAGNOSIS — K5900 Constipation, unspecified: Secondary | ICD-10-CM | POA: Diagnosis not present

## 2023-09-22 DIAGNOSIS — R141 Gas pain: Secondary | ICD-10-CM | POA: Diagnosis not present

## 2023-09-22 LAB — HEPATIC FUNCTION PANEL
ALT: 23 IU/L (ref 0–44)
AST: 31 IU/L (ref 0–40)
Albumin: 4.1 g/dL (ref 3.7–4.7)
Alkaline Phosphatase: 83 IU/L (ref 44–121)
Bilirubin Total: 0.6 mg/dL (ref 0.0–1.2)
Bilirubin, Direct: 0.28 mg/dL (ref 0.00–0.40)
Total Protein: 6.1 g/dL (ref 6.0–8.5)

## 2023-09-22 LAB — BASIC METABOLIC PANEL WITH GFR
BUN/Creatinine Ratio: 32 — ABNORMAL HIGH (ref 10–24)
BUN: 38 mg/dL — ABNORMAL HIGH (ref 8–27)
CO2: 21 mmol/L (ref 20–29)
Calcium: 9.6 mg/dL (ref 8.6–10.2)
Chloride: 104 mmol/L (ref 96–106)
Creatinine, Ser: 1.17 mg/dL (ref 0.76–1.27)
Glucose: 83 mg/dL (ref 70–99)
Potassium: 4.7 mmol/L (ref 3.5–5.2)
Sodium: 141 mmol/L (ref 134–144)
eGFR: 60 mL/min/{1.73_m2} (ref >59)

## 2023-09-22 LAB — LIPID PANEL
Chol/HDL Ratio: 2.1 ratio (ref 0.0–5.0)
Cholesterol, Total: 111 mg/dL (ref 100–199)
HDL: 53 mg/dL (ref >39)
LDL Chol Calc (NIH): 47 mg/dL (ref 0–99)
Triglycerides: 43 mg/dL (ref 0–149)
VLDL Cholesterol Cal: 11 mg/dL (ref 5–40)

## 2023-09-26 NOTE — Progress Notes (Unsigned)
 Cardiology Office Note:    Date:  09/28/2023   ID:  Tony Prince, DOB Nov 02, 1932, MRN [id number]  PCP:  Charlott Dorn LABOR, MD   [facility] Providers Cardiologist:  None     Referring MD: Charlott Dorn LABOR, *   Chief Complaint  Patient presents with   Coronary Artery Disease   Shortness of Breath    History of Present Illness:    Tony Prince is a 89 y.o. male is seen for follow up of dyspnea on exertion. He was seen by Dr Burnard remotely in 2019 with DOE. Echo showed mild pulmonary HTN but was otherwise normal. Myoview  was normal.   The patient states he has had DOE for decades but that it has progressed over the past year to the point where any activity makes him short of breath- even brushing his teeth. If he tries to do anything quickly he gets really SOB. If he is able to take it more slowly he does better. He has some vague chest pain but attributes this to muscle soreness. He had RUL lobectomy 10 years ago for infectious process. He has a history of bladder CA cleared with BCG treatments. He did have recent PT for kyphosis but this didn't really help. He has always been skinny. No cough or edema. [facility] [place] hasn't been since the beginning of the pandemic.   On our evaluation Echo was unremarkable. Coronary CTA showed findings c/w severe LAD stenosis with abnormal FFR. On our last visit we discussed results and option for cardiac cath with possible PCI. He wanted to try medication. He was placed on Imdur  15 mg daily and a statin.  On follow up today he still complains that he is deconditioned over the past 7 months. Still SOB with pretty much any activity. Does note chest pain at times - thought it might  be GI related so seeing his gastroenterologist. He did fall last month and had a laceration of his forearm. A couple of weeks ago had marked shaking for 2 hours and was seen by PCP. Negative for Covid. Did have sneezing  and runny nose for 2 days.    Past Medical History:  Diagnosis Date   Allergic rhinitis    Anxiety    Arthritis    degenerative joints//  neck   Bladder cancer [facility]) urologist-  dr matilda   invasive high grade carcinoma  per pathology 02-07-2015   BPH (benign prostatic hyperplasia)    Chronically dry eyes    COPD (chronic obstructive pulmonary disease) (HCC)    Exertional shortness of breath    GERD (gastroesophageal reflux disease)    no meds   Heart murmur    2014 told that he has murmur by Dr. Roseann //  per echo 2011  no MVP   Hiatal hernia    History of lung abscess    PULMONARY NECROSIS  S/P  RIGHT UPPER LOBECTOMY  09-14-2012   Left inguinal hernia    Lower urinary tract symptoms (LUTS)    Osteoporosis    Positional vertigo    Psoriasis    Sebaceous cyst    left shoulder   Thrombocytopenia (HCC)    MILD    Past Surgical History:  Procedure Laterality Date   APPENDECTOMY  1945   CARDIOVASCULAR STRESS TEST  08-31-2012  dr dann   Essentialy normal perfusion study, low risk/  normal LV function and wall motion, ef 89%  CATARACT EXTRACTION W/ INTRAOCULAR LENS  IMPLANT, BILATERAL     LOBECTOMY Right 09/14/2012   Procedure: LOBECTOMY;  Surgeon: Elspeth JAYSON Millers, MD;  Location: [facility] OR;  Service: Thoracic;  Laterality: Right;   NEGATIVE SLEEP STUDY  2014 per pt   THORACOTOMY/LOBECTOMY Right 09/14/2012   Procedure: THORACOTOMY/LOBECTOMY;  Surgeon: Elspeth JAYSON Millers, MD;  Location: [facility] OR;  Service: Thoracic;  Laterality: Right;   TONSILLECTOMY  as child   TRANSTHORACIC ECHOCARDIOGRAM  10-10-2009   grade 1 diastolic dysfunction,  ef 71%/  mild TR and PR/  aortic valve sclerotic but opens well/     TRANSURETHRAL RESECTION OF BLADDER TUMOR N/A 04/04/2015   Procedure: CYSTOSCOPY WITH BLADDER TUMOR BIOPSIES;  Surgeon: Garnette Shack, MD;  Location: [facility];  Service: Urology;  Laterality: N/A;   TRANSURETHRAL RESECTION OF BLADDER TUMOR WITH  GYRUS (TURBT-GYRUS) N/A 02/07/2015   Procedure: TRANSURETHRAL RESECTION OF BLADDER TUMOR WITH GYRUS (TURBT-GYRUS) LOOP;  Surgeon: Garnette Shack, MD;  Location: [facility];  Service: Urology;  Laterality: N/A;   TRANSURETHRAL RESECTION OF PROSTATE  1998   VIDEO ASSISTED THORACOSCOPY (VATS)/WEDGE RESECTION Right 09/14/2012   Procedure: VIDEO ASSISTED THORACOSCOPY (VATS)/WEDGE RESECTION;  Surgeon: Elspeth JAYSON Millers, MD;  Location: [facility] OR;  Service: Thoracic;  Laterality: Right;  possible chest wall resection    Current Medications: Current Meds  Medication Sig   alendronate (FOSAMAX) 70 MG tablet 1 TABLET 30 MINUTES BEFORE THE FIRST FOOD, BEVERAGE OR MEDICINE OF THE DAY WITH PLAIN WATER  WEEKLY ORALLY 90   aspirin  EC 81 MG tablet Take 1 tablet (81 mg total) by mouth daily. Swallow whole.   LORazepam  (ATIVAN ) 0.5 MG tablet Take 0.5 mg by mouth at bedtime.    rosuvastatin  (CRESTOR ) 5 MG tablet Take 1 tablet (5 mg total) by mouth daily.   triamcinolone  (KENALOG ) topical spray Apply topically 2 (two) times a week. 1 application   [DISCONTINUED] isosorbide  mononitrate (IMDUR ) 30 MG 24 hr tablet Take 0.5 tablets (15 mg total) by mouth daily.     Allergies:   Moxifloxacin, Iodinated contrast media, and Sulfa  antibiotics   Social History   Socioeconomic History   Marital status: Widowed    Spouse name: Not on file   Number of children: 2   Years of education: Not on file   Highest education level: Not on file  Occupational History   Occupation: professor phyloscopy and religion    Comment: at [employer]  Tobacco Use   Smoking status: Former    Types: Pipe    Quit date: 03/23/1978    Years since quitting: 45.5   Smokeless tobacco: Never  Vaping Use   Vaping status: Never Used  Substance and Sexual Activity   Alcohol  use: Yes    Comment: VERY RARE   Drug use: No   Sexual activity: Not Currently    Partners: Female    Comment: WIDOWED  Other Topics Concern   Not  on file  Social History Narrative   Not on file   Social Drivers of Health   Financial Resource Strain: Not on file  Food Insecurity: Not on file  Transportation Needs: Not on file  Physical Activity: Not on file  Stress: Not on file  Social Connections: Not on file     Family History: The patient's family history includes Colon polyps in his mother; Crohn's disease in his daughter; Diabetes in his father; Heart disease in his brother, brother, and father; Hyperlipidemia in his father; Hypertension in his  father.  ROS:   Please see the history of present illness.     All other systems reviewed and are negative.  EKGs/Labs/Other Studies Reviewed:    The following studies were reviewed today: Echo 05/31/23: IMPRESSIONS     1. Left ventricular ejection fraction, by estimation, is 65 to 70%. Left  ventricular ejection fraction by 3D volume is 69 %. The left ventricle has  normal function. Left ventricular endocardial border not optimally defined  to evaluate regional wall  motion. Left ventricular diastolic parameters were normal. The average  left ventricular global longitudinal strain is -18.5 %. The global  longitudinal strain is normal.   2. Right ventricular systolic function is normal. The right ventricular  size is normal.   3. The mitral valve is normal in structure. Mild to moderate mitral valve  regurgitation. No evidence of mitral stenosis.   4. The aortic valve is tricuspid. Aortic valve regurgitation is mild. No  aortic stenosis is present.      Coronary CTA 06/28/23: FINDINGS: Image quality: Good   Noise artifact is: Mild slab artifact   Coronary calcium  score is 1313.   Coronary arteries: Normal coronary origins.  Right dominance.   Right Coronary Artery: Minimal plaque proximal RCA, <25% stenosis, mild plaque mid RCA, 25-49% stenosis. Moderate mixed plaque distal RCA, 50-69% stenosis.   Left Main Coronary Artery: Minimal mixed atherosclerotic  plaque <25%.   Left Anterior Descending Coronary Artery: Moderate mixed plaque proximal LAD, 50-69% stenosis. In the proximal to mid LAD, there is a severe calcified stenosis, 70-99% stenosis. There is a serial mid LAD severe stenosis 70-99%. Moderate mixed plaque distal LAD, 50-69% stenosis.   Left Circumflex Artery: Minimal mixed plaque in the mid LCx and proximal OM3, <25% stenosis.   Aorta: Normal size, 32 mm at the mid ascending aorta (level of the PA bifurcation) measured double oblique.   Aortic Valve: Trivial calcifications. AV calcium  score 40.   Other findings:   Normal pulmonary vein drainage into the left atrium.   Normal left atrial appendage without thrombus.   Normal size of the pulmonary artery.   Severe biatrial dilation.   Please see separate report from [facility] for non-cardiac findings.   IMPRESSION: 1. Severe CAD in the proximal-mid LAD and mid LAD, 70-99% stenosis, CADRADS 4. CT FFR will be performed and reported separately.   2. Total plaque volume 934 mm3 which is 55th percentile for age- and sex-matched controls (calcified plaque 273 mm3; non-calcified plaque 661 mm3). TPV is extensive.   3. Coronary calcium  score of 1313.   4. Normal coronary origins with right dominance. FINDINGS: 1. Left Main: Low likelihood of hemodynamic significance.   2. LAD: High likelihood of hemodynamic significance in the mid LAD, FFR 0.65, delta 0.15. Mid LAD dimension is approximately 3 mm at site of lesion with dense calcification. 3. LCX: Low likelihood of hemodynamic significance. 4. RCA: Low likelihood of hemodynamic significance.   IMPRESSION: 1. CT FFR analysis showed high likelihood of hemodynamically significant stenosis in the mid LAD.      Recent Labs: 09/21/2023: ALT 23; BUN 38; Creatinine, Ser 1.17; Potassium 4.7; Sodium 141  Recent Lipid Panel    Component Value Date/Time   CHOL 111 09/21/2023 0828   TRIG 43 09/21/2023 0828    HDL 53 09/21/2023 0828   CHOLHDL 2.1 09/21/2023 0828   LDLCALC 47 09/21/2023 0828  Dated 02/23/23: normal CBC. BUN 46, creatinine 0.91. otherwise CMET and TSH normal.    Risk Assessment/Calculations:  Physical Exam:    VS:  BP 130/62 (BP Location: Left Arm, Patient Position: Sitting, Cuff Size: Normal)   Pulse (!) 52   Ht 5' 9 (1.753 m)   Wt 128 lb 6.4 oz (58.2 kg)   SpO2 99%   BMI 18.96 kg/m     Wt Readings from Last 3 Encounters:  09/28/23 128 lb 6.4 oz (58.2 kg)  07/05/23 128 lb 6.4 oz (58.2 kg)  04/26/23 127 lb (57.6 kg)     GEN:  Well nourished, thin in no acute distress HEENT: Normal NECK: No JVD; No carotid bruits LYMPHATICS: No lymphadenopathy CARDIAC: RRR, no murmurs, rubs, gallops RESPIRATORY:  Clear to auscultation without rales, wheezing or rhonchi  ABDOMEN: Soft, non-tender, non-distended MUSCULOSKELETAL:  No edema; No deformity  SKIN: Warm and dry NEUROLOGIC:  Alert and oriented x 3 PSYCHIATRIC:  Normal affect   ASSESSMENT:    1. Coronary artery disease of native artery of native heart with stable angina pectoris (HCC)   2. Dyspnea on exertion   3. Hypercholesteremia      PLAN:    In order of problems listed above:  DOE/chest pain. Coronary CTA indicates a significant stenosis in the mid LAD which is likely contributing to his symptoms. He also has prior lobectomy and deconditioning. Possible component of COPD. Fortunately his Echo looked good.  Again discussed management with medical therapy versus invasive evaluation with possible stenting. He would like to continue medical therapy. Will increase Imdur  to 30 mg daily. Would consider adding PPI but he wanted to discuss with GI. Not a candidate for beta blocker due to bradycardia. Will follow up in 3 months.  Bifascicular block with LAHB and RBBB. No evidence of AV block. Asymptomatic. Will observe.  HLD. Excellent response to statin with LDL 47.             Medication Adjustments/Labs  and Tests Ordered: Current medicines are reviewed at length with the patient today.  Concerns regarding medicines are outlined above.  No orders of the defined types were placed in this encounter.  Meds ordered this encounter  Medications   isosorbide  mononitrate (IMDUR ) 30 MG 24 hr tablet    Sig: Take 1 tablet (30 mg total) by mouth daily.    Dispense:  30 tablet    Refill:  6    There are no Patient Instructions on file for this visit.   Signed, Alenna Russell [place], MD  09/28/2023 1:40 PM    [facility]

## 2023-09-28 ENCOUNTER — Encounter: Payer: Self-pay | Admitting: Cardiology

## 2023-09-28 ENCOUNTER — Ambulatory Visit: Attending: Cardiology | Admitting: Cardiology

## 2023-09-28 VITALS — BP 130/62 | HR 52 | Ht 69.0 in | Wt 128.4 lb

## 2023-09-28 DIAGNOSIS — R0609 Other forms of dyspnea: Secondary | ICD-10-CM

## 2023-09-28 DIAGNOSIS — E7800 Pure hypercholesterolemia, unspecified: Secondary | ICD-10-CM | POA: Diagnosis not present

## 2023-09-28 DIAGNOSIS — I25118 Atherosclerotic heart disease of native coronary artery with other forms of angina pectoris: Secondary | ICD-10-CM

## 2023-09-28 MED ORDER — ISOSORBIDE MONONITRATE ER 30 MG PO TB24: 30.0000 mg | ORAL_TABLET | Freq: Every day | ORAL | 6 refills | Status: AC

## 2023-09-28 NOTE — Patient Instructions (Addendum)
 Medication Instructions:  Increase Isosorbide  to 30 mg daily Continue all other medications  *If you need a refill on your cardiac medications before your next appointment, please call your pharmacy*  Lab Work: None ordered  Testing/Procedures: None ordered  Follow-Up: At [facility], you and your health needs are our priority.  As part of our continuing mission to provide you with exceptional heart care, our providers are all part of one team.  This team includes your primary Cardiologist (physician) and Advanced Practice Providers or APPs (Physician Assistants and Nurse Practitioners) who all work together to provide you with the care you need, when you need it.  Your next appointment:  3 months     Monday 10/6 at 3:20 pm    Provider: Dr.Jordan    We recommend signing up for the patient portal called MyChart.  Sign up information is provided on this After Visit Summary.  MyChart is used to connect with patients for Virtual Visits (Telemedicine).  Patients are able to view lab/test results, encounter notes, upcoming appointments, etc.  Non-urgent messages can be sent to your provider as well.   To learn more about what you can do with MyChart, go to [email or web address].

## 2023-09-30 DIAGNOSIS — L821 Other seborrheic keratosis: Secondary | ICD-10-CM | POA: Diagnosis not present

## 2023-09-30 DIAGNOSIS — L218 Other seborrheic dermatitis: Secondary | ICD-10-CM | POA: Diagnosis not present

## 2023-09-30 DIAGNOSIS — L570 Actinic keratosis: Secondary | ICD-10-CM | POA: Diagnosis not present

## 2023-09-30 DIAGNOSIS — Z85828 Personal history of other malignant neoplasm of skin: Secondary | ICD-10-CM | POA: Diagnosis not present

## 2023-09-30 DIAGNOSIS — L820 Inflamed seborrheic keratosis: Secondary | ICD-10-CM | POA: Diagnosis not present

## 2023-10-26 DIAGNOSIS — K219 Gastro-esophageal reflux disease without esophagitis: Secondary | ICD-10-CM | POA: Diagnosis not present

## 2023-10-27 DIAGNOSIS — D485 Neoplasm of uncertain behavior of skin: Secondary | ICD-10-CM | POA: Diagnosis not present

## 2023-10-27 DIAGNOSIS — D0439 Carcinoma in situ of skin of other parts of face: Secondary | ICD-10-CM | POA: Diagnosis not present

## 2023-10-27 DIAGNOSIS — Z85828 Personal history of other malignant neoplasm of skin: Secondary | ICD-10-CM | POA: Diagnosis not present

## 2023-11-10 DIAGNOSIS — D649 Anemia, unspecified: Secondary | ICD-10-CM | POA: Diagnosis not present

## 2023-11-18 ENCOUNTER — Encounter: Payer: Self-pay | Admitting: Podiatry

## 2023-11-18 ENCOUNTER — Ambulatory Visit: Admitting: Podiatry

## 2023-11-18 DIAGNOSIS — B351 Tinea unguium: Secondary | ICD-10-CM

## 2023-11-18 DIAGNOSIS — M79674 Pain in right toe(s): Secondary | ICD-10-CM

## 2023-11-18 DIAGNOSIS — M79675 Pain in left toe(s): Secondary | ICD-10-CM | POA: Diagnosis not present

## 2023-11-18 NOTE — Progress Notes (Signed)
This patient presents to the office for evaluation of callus on bottom outside of his left foot.  He has previously been seen for capsulitis and callus under the bone left foot.  He says there is pain at this visit.    he would like his nails to trimmed also.  The nails are painful walking and wearing his shoes.  He presents for evaluation and treatment.  General Appearance  Alert, conversant and in no acute stress.  Vascular  Dorsalis pedis and posterior tibial  pulses are  weakly palpable  bilaterally.  Capillary return is within normal limits  bilaterally. Temperature is within normal limits  bilaterally.  Neurologic  Senn-Weinstein monofilament wire test within normal limits  bilaterally. Muscle power within normal limits bilaterally.  Nails Thick disfigured discolored nails with subungual debris  hallux nails bilaterally. No evidence of bacterial infection or drainage bilaterally.  Orthopedic  No limitations of motion  feet .  No crepitus or effusions noted.  Tailors bunion fifth MPJ left foot.  Skin  normotropic skin  noted bilaterally.  No signs of infections or ulcers noted.  Onychomycosis  B/L     Debrided his nails with nail nipper followed by dremel tool usage.  RTC 3   months  Helane Gunther DPM

## 2023-12-18 NOTE — Progress Notes (Deleted)
 Cardiology Office Note:    Date:  12/18/2023   ID:  Tony Prince, DOB 06/30/1932, MRN [id number]  PCP:  Tony Prince LABOR, MD   [facility] Providers Cardiologist:  None     Referring MD: Tony Prince LABOR, *   No chief complaint on file.   History of Present Illness:    Tony Prince is a 89 y.o. male is seen for follow up of dyspnea on exertion. He was seen by Dr Tony Prince remotely in 2019 with DOE. Echo showed mild pulmonary HTN but was otherwise normal. Myoview  was normal.   The patient states he has had DOE for decades but that it has progressed over the past year to the point where any activity makes him short of breath- even brushing his teeth. If he tries to do anything quickly he gets really SOB. If he is able to take it more slowly he does better. He has some vague chest pain but attributes this to muscle soreness. He had RUL lobectomy 10 years ago for infectious process. He has a history of bladder CA cleared with BCG treatments. He did have recent PT for kyphosis but this didn't really help. He has always been skinny. No cough or edema. [facility] but hasn't been since the beginning of the pandemic.   On our evaluation Echo was unremarkable. Coronary CTA showed findings c/w severe LAD stenosis with abnormal FFR. On our last visit we discussed results and option for cardiac cath with possible PCI. He wanted to try medication. He was placed on Imdur  15 mg daily and a statin.  On follow up today he still complains that he is deconditioned over the past 7 months. Still SOB with pretty much any activity. Does note chest pain at times - thought it might  be GI related so seeing his gastroenterologist. He did fall last month and had a laceration of his forearm. A couple of weeks ago had marked shaking for 2 hours and was seen by PCP. Negative for Covid. Did have sneezing and runny nose for 2 days.    Past Medical History:   Diagnosis Date   Allergic rhinitis    Anxiety    Arthritis    degenerative joints//  neck   Bladder cancer [facility]) urologist-  dr Tony Prince   invasive high grade carcinoma  per pathology 02-07-2015   BPH (benign prostatic hyperplasia)    Chronically dry eyes    COPD (chronic obstructive pulmonary disease) (HCC)    Exertional shortness of breath    GERD (gastroesophageal reflux disease)    no meds   Heart murmur    2014 told that he has murmur by Dr. Roseann //  per echo 2011  no MVP   Hiatal hernia    History of lung abscess    PULMONARY NECROSIS  S/P  RIGHT UPPER LOBECTOMY  09-14-2012   Left inguinal hernia    Lower urinary tract symptoms (LUTS)    Osteoporosis    Positional vertigo    Psoriasis    Sebaceous cyst    left shoulder   Thrombocytopenia    MILD    Past Surgical History:  Procedure Laterality Date   APPENDECTOMY  1945   CARDIOVASCULAR STRESS TEST  08-31-2012  dr dann   Essentialy normal perfusion study, low risk/  normal LV function and wall motion, ef 89%   CATARACT EXTRACTION W/ INTRAOCULAR LENS  IMPLANT, BILATERAL  LOBECTOMY Right 09/14/2012   Procedure: LOBECTOMY;  Surgeon: Elspeth JAYSON Millers, MD;  Location: [facility] OR;  Service: Thoracic;  Laterality: Right;   NEGATIVE SLEEP STUDY  2014 per pt   THORACOTOMY/LOBECTOMY Right 09/14/2012   Procedure: THORACOTOMY/LOBECTOMY;  Surgeon: Elspeth JAYSON Millers, MD;  Location: [facility] OR;  Service: Thoracic;  Laterality: Right;   TONSILLECTOMY  as child   TRANSTHORACIC ECHOCARDIOGRAM  10-10-2009   grade 1 diastolic dysfunction,  ef 71%/  mild TR and PR/  aortic valve sclerotic but opens well/     TRANSURETHRAL RESECTION OF BLADDER TUMOR N/A 04/04/2015   Procedure: CYSTOSCOPY WITH BLADDER TUMOR BIOPSIES;  Surgeon: Garnette Shack, MD;  Location: [facility];  Service: Urology;  Laterality: N/A;   TRANSURETHRAL RESECTION OF BLADDER TUMOR WITH GYRUS (TURBT-GYRUS) N/A 02/07/2015   Procedure: TRANSURETHRAL  RESECTION OF BLADDER TUMOR WITH GYRUS (TURBT-GYRUS) LOOP;  Surgeon: Garnette Shack, MD;  Location: [facility];  Service: Urology;  Laterality: N/A;   TRANSURETHRAL RESECTION OF PROSTATE  1998   VIDEO ASSISTED THORACOSCOPY (VATS)/WEDGE RESECTION Right 09/14/2012   Procedure: VIDEO ASSISTED THORACOSCOPY (VATS)/WEDGE RESECTION;  Surgeon: Elspeth JAYSON Millers, MD;  Location: [facility] OR;  Service: Thoracic;  Laterality: Right;  possible chest wall resection    Current Medications: No outpatient medications have been marked as taking for the 12/27/23 encounter (Appointment) with [place], Hermie Reagor M, MD.     Allergies:   Moxifloxacin, Iodinated contrast media, and Sulfa  antibiotics   Social History   Socioeconomic History   Marital status: Widowed    Spouse name: Not on file   Number of children: 2   Years of education: Not on file   Highest education level: Not on file  Occupational History   Occupation: professor phyloscopy and religion    Comment: at [employer]  Tobacco Use   Smoking status: Former    Types: Pipe    Quit date: 03/23/1978    Years since quitting: 45.7   Smokeless tobacco: Never  Vaping Use   Vaping status: Never Used  Substance and Sexual Activity   Alcohol  use: Yes    Comment: VERY RARE   Drug use: No   Sexual activity: Not Currently    Partners: Female    Comment: WIDOWED  Other Topics Concern   Not on file  Social History Narrative   Not on file   Social Drivers of Health   Financial Resource Strain: Not on file  Food Insecurity: Not on file  Transportation Needs: Not on file  Physical Activity: Not on file  Stress: Not on file  Social Connections: Not on file     Family History: The patient's family history includes Colon polyps in his mother; Crohn's disease in his daughter; Diabetes in his father; Heart disease in his brother, brother, and father; Hyperlipidemia in his father; Hypertension in his father.  ROS:   Please see the history  of present illness.     All other systems reviewed and are negative.  EKGs/Labs/Other Studies Reviewed:    The following studies were reviewed today: Echo 05/31/23: IMPRESSIONS     1. Left ventricular ejection fraction, by estimation, is 65 to 70%. Left  ventricular ejection fraction by 3D volume is 69 %. The left ventricle has  normal function. Left ventricular endocardial border not optimally defined  to evaluate regional wall  motion. Left ventricular diastolic parameters were normal. The average  left ventricular global longitudinal strain is -18.5 %. The global  longitudinal strain is normal.  2. Right ventricular systolic function is normal. The right ventricular  size is normal.   3. The mitral valve is normal in structure. Mild to moderate mitral valve  regurgitation. No evidence of mitral stenosis.   4. The aortic valve is tricuspid. Aortic valve regurgitation is mild. No  aortic stenosis is present.      Coronary CTA 06/28/23: FINDINGS: Image quality: Good   Noise artifact is: Mild slab artifact   Coronary calcium  score is 1313.   Coronary arteries: Normal coronary origins.  Right dominance.   Right Coronary Artery: Minimal plaque proximal RCA, <25% stenosis, mild plaque mid RCA, 25-49% stenosis. Moderate mixed plaque distal RCA, 50-69% stenosis.   Left Main Coronary Artery: Minimal mixed atherosclerotic plaque <25%.   Left Anterior Descending Coronary Artery: Moderate mixed plaque proximal LAD, 50-69% stenosis. In the proximal to mid LAD, there is a severe calcified stenosis, 70-99% stenosis. There is a serial mid LAD severe stenosis 70-99%. Moderate mixed plaque distal LAD, 50-69% stenosis.   Left Circumflex Artery: Minimal mixed plaque in the mid LCx and proximal OM3, <25% stenosis.   Aorta: Normal size, 32 mm at the mid ascending aorta (level of the PA bifurcation) measured double oblique.   Aortic Valve: Trivial calcifications. AV calcium  score 40.    Other findings:   Normal pulmonary vein drainage into the left atrium.   Normal left atrial appendage without thrombus.   Normal size of the pulmonary artery.   Severe biatrial dilation.   Please see separate report from [facility] for non-cardiac findings.   IMPRESSION: 1. Severe CAD in the proximal-mid LAD and mid LAD, 70-99% stenosis, CADRADS 4. CT FFR will be performed and reported separately.   2. Total plaque volume 934 mm3 which is 55th percentile for age- and sex-matched controls (calcified plaque 273 mm3; non-calcified plaque 661 mm3). TPV is extensive.   3. Coronary calcium  score of 1313.   4. Normal coronary origins with right dominance. FINDINGS: 1. Left Main: Low likelihood of hemodynamic significance.   2. LAD: High likelihood of hemodynamic significance in the mid LAD, FFR 0.65, delta 0.15. Mid LAD dimension is approximately 3 mm at site of lesion with dense calcification. 3. LCX: Low likelihood of hemodynamic significance. 4. RCA: Low likelihood of hemodynamic significance.   IMPRESSION: 1. CT FFR analysis showed high likelihood of hemodynamically significant stenosis in the mid LAD.      Recent Labs: 09/21/2023: ALT 23; BUN 38; Creatinine, Ser 1.17; Potassium 4.7; Sodium 141  Recent Lipid Panel    Component Value Date/Time   CHOL 111 09/21/2023 0828   TRIG 43 09/21/2023 0828   HDL 53 09/21/2023 0828   CHOLHDL 2.1 09/21/2023 0828   LDLCALC 47 09/21/2023 0828  Dated 02/23/23: normal CBC. BUN 46, creatinine 0.91. otherwise CMET and TSH normal.    Risk Assessment/Calculations:       Physical Exam:    VS:  There were no vitals taken for this visit.    Wt Readings from Last 3 Encounters:  09/28/23 128 lb 6.4 oz (58.2 kg)  07/05/23 128 lb 6.4 oz (58.2 kg)  04/26/23 127 lb (57.6 kg)     GEN:  Well nourished, thin in no acute distress HEENT: Normal NECK: No JVD; No carotid bruits LYMPHATICS: No lymphadenopathy CARDIAC: RRR, no  murmurs, rubs, gallops RESPIRATORY:  Clear to auscultation without rales, wheezing or rhonchi  ABDOMEN: Soft, non-tender, non-distended MUSCULOSKELETAL:  No edema; No deformity  SKIN: Warm and dry NEUROLOGIC:  Alert and oriented  x 3 PSYCHIATRIC:  Normal affect   ASSESSMENT:    No diagnosis found.    PLAN:    In order of problems listed above:  DOE/chest pain. Coronary CTA indicates a significant stenosis in the mid LAD which is likely contributing to his symptoms. He also has prior lobectomy and deconditioning. Possible component of COPD. Fortunately his Echo looked good.  Again discussed management with medical therapy versus invasive evaluation with possible stenting. He would like to continue medical therapy. Will increase Imdur  to 30 mg daily. Would consider adding PPI but he wanted to discuss with GI. Not a candidate for beta blocker due to bradycardia. Will follow up in 3 months.  Bifascicular block with LAHB and RBBB. No evidence of AV block. Asymptomatic. Will observe.  HLD. Excellent response to statin with LDL 47.             Medication Adjustments/Labs and Tests Ordered: Current medicines are reviewed at length with the patient today.  Concerns regarding medicines are outlined above.  No orders of the defined types were placed in this encounter.  No orders of the defined types were placed in this encounter.   There are no Patient Instructions on file for this visit.   Signed, Emmilia Sowder [place], MD  12/18/2023 11:21 AM    [facility]

## 2023-12-24 DIAGNOSIS — R399 Unspecified symptoms and signs involving the genitourinary system: Secondary | ICD-10-CM | POA: Diagnosis not present

## 2023-12-27 ENCOUNTER — Ambulatory Visit: Admitting: Cardiology

## 2023-12-27 DIAGNOSIS — N400 Enlarged prostate without lower urinary tract symptoms: Secondary | ICD-10-CM | POA: Diagnosis not present

## 2023-12-27 DIAGNOSIS — C678 Malignant neoplasm of overlapping sites of bladder: Secondary | ICD-10-CM | POA: Diagnosis not present

## 2023-12-30 DIAGNOSIS — H04123 Dry eye syndrome of bilateral lacrimal glands: Secondary | ICD-10-CM | POA: Diagnosis not present

## 2024-01-20 ENCOUNTER — Ambulatory Visit: Admitting: Physician Assistant

## 2024-02-01 DIAGNOSIS — D692 Other nonthrombocytopenic purpura: Secondary | ICD-10-CM | POA: Diagnosis not present

## 2024-02-01 DIAGNOSIS — Z85828 Personal history of other malignant neoplasm of skin: Secondary | ICD-10-CM | POA: Diagnosis not present

## 2024-02-04 ENCOUNTER — Other Ambulatory Visit: Payer: Self-pay | Admitting: Cardiology

## 2024-02-09 NOTE — Progress Notes (Signed)
 Cardiology Office Note:    Date:  02/15/2024   ID:  Tony Prince, DOB [birth year], MRN [id number]  PCP:  Charlott Dorn LABOR, MD   [facility] Providers Cardiologist:  None     Referring MD: Charlott Dorn LABOR, *   Chief Complaint  Patient presents with   Coronary Artery Disease    History of Present Illness:    Tony Prince is a 89 y.o. male is seen for follow up of dyspnea on exertion. He was seen by Dr Burnard remotely in 2019 with DOE. Echo showed mild pulmonary HTN but was otherwise normal. Myoview  was normal.   The patient states he has had DOE for decades but that it has progressed over the past year to the point where any activity makes him short of breath- even brushing his teeth. If he tries to do anything quickly he gets really SOB. If he is able to take it more slowly he does better. He has some vague chest pain but attributes this to muscle soreness. He had RUL lobectomy 10 years ago for infectious process. He has a history of bladder CA cleared with BCG treatments. He did have recent PT for kyphosis but this didn't really help. He has always been skinny. No cough or edema. [facility] but hasn't been since the beginning of the pandemic.   On our evaluation Echo was unremarkable. Coronary CTA showed findings c/w severe LAD stenosis with abnormal FFR. On our last visit we discussed results and option for cardiac cath with possible PCI. He wanted to try medication. He was placed on Imdur  15 mg daily and a statin.  On follow up today he reports his cardiac situation is unchanged. SOB is the same. No significant chest pain. He does complain that is chronic dry eyes is much worse and not getting better with eye drops. He also reports a sinus infection which he has had numerous times before and the only thing that helps is a course of steroids and antibiotics.    Past Medical History:  Diagnosis Date   Allergic rhinitis     Anxiety    Arthritis    degenerative joints//  neck   Bladder cancer [facility]) urologist-  dr matilda   invasive high grade carcinoma  per pathology 02-07-2015   BPH (benign prostatic hyperplasia)    Chronically dry eyes    COPD (chronic obstructive pulmonary disease) (HCC)    Exertional shortness of breath    GERD (gastroesophageal reflux disease)    no meds   Heart murmur    2014 told that he has murmur by Dr. Roseann //  per echo 2011  no MVP   Hiatal hernia    History of lung abscess    PULMONARY NECROSIS  S/P  RIGHT UPPER LOBECTOMY  09-14-2012   Left inguinal hernia    Lower urinary tract symptoms (LUTS)    Osteoporosis    Positional vertigo    Psoriasis    Sebaceous cyst    left shoulder   Thrombocytopenia    MILD    Past Surgical History:  Procedure Laterality Date   APPENDECTOMY  1945   CARDIOVASCULAR STRESS TEST  08-31-2012  dr dann   Essentialy normal perfusion study, low risk/  normal LV function and wall motion, ef 89%   CATARACT EXTRACTION W/ INTRAOCULAR LENS  IMPLANT, BILATERAL     LOBECTOMY Right 09/14/2012   Procedure: LOBECTOMY;  Surgeon: Elspeth BROCKS  Kerrin, MD;  Location: [facility] OR;  Service: Thoracic;  Laterality: Right;   NEGATIVE SLEEP STUDY  2014 per pt   THORACOTOMY/LOBECTOMY Right 09/14/2012   Procedure: THORACOTOMY/LOBECTOMY;  Surgeon: Elspeth JAYSON Kerrin, MD;  Location: [facility] OR;  Service: Thoracic;  Laterality: Right;   TONSILLECTOMY  as child   TRANSTHORACIC ECHOCARDIOGRAM  10-10-2009   grade 1 diastolic dysfunction,  ef 71%/  mild TR and PR/  aortic valve sclerotic but opens well/     TRANSURETHRAL RESECTION OF BLADDER TUMOR N/A 04/04/2015   Procedure: CYSTOSCOPY WITH BLADDER TUMOR BIOPSIES;  Surgeon: Garnette Shack, MD;  Location: [facility];  Service: Urology;  Laterality: N/A;   TRANSURETHRAL RESECTION OF BLADDER TUMOR WITH GYRUS (TURBT-GYRUS) N/A 02/07/2015   Procedure: TRANSURETHRAL RESECTION OF BLADDER TUMOR WITH GYRUS  (TURBT-GYRUS) LOOP;  Surgeon: Garnette Shack, MD;  Location: [facility];  Service: Urology;  Laterality: N/A;   TRANSURETHRAL RESECTION OF PROSTATE  1998   VIDEO ASSISTED THORACOSCOPY (VATS)/WEDGE RESECTION Right 09/14/2012   Procedure: VIDEO ASSISTED THORACOSCOPY (VATS)/WEDGE RESECTION;  Surgeon: Elspeth JAYSON Kerrin, MD;  Location: [facility] OR;  Service: Thoracic;  Laterality: Right;  possible chest wall resection    Current Medications: Current Meds  Medication Sig   alendronate (FOSAMAX) 70 MG tablet 1 TABLET 30 MINUTES BEFORE THE FIRST FOOD, BEVERAGE OR MEDICINE OF THE DAY WITH PLAIN WATER  WEEKLY ORALLY 90   aspirin  EC 81 MG tablet Take 1 tablet (81 mg total) by mouth daily. Swallow whole.   isosorbide  mononitrate (IMDUR ) 30 MG 24 hr tablet Take 1 tablet (30 mg total) by mouth daily.   LORazepam  (ATIVAN ) 0.5 MG tablet Take 0.5 mg by mouth at bedtime.    omeprazole (PRILOSEC) 40 MG capsule Take 40 mg by mouth every morning.   rosuvastatin  (CRESTOR ) 5 MG tablet TAKE 1 TABLET (5 MG TOTAL) BY MOUTH DAILY.   triamcinolone  (KENALOG ) topical spray Apply topically 2 (two) times a week. 1 application     Allergies:   Moxifloxacin, Iodinated contrast media, and Sulfa  antibiotics   Social History   Socioeconomic History   Marital status: Widowed    Spouse name: Not on file   Number of children: 2   Years of education: Not on file   Highest education level: Not on file  Occupational History   Occupation: professor phyloscopy and religion    Comment: at [employer]  Tobacco Use   Smoking status: Former    Types: Pipe    Quit date: 03/23/1978    Years since quitting: 45.9   Smokeless tobacco: Never  Vaping Use   Vaping status: Never Used  Substance and Sexual Activity   Alcohol  use: Yes    Comment: VERY RARE   Drug use: No   Sexual activity: Not Currently    Partners: Female    Comment: WIDOWED  Other Topics Concern   Not on file  Social History Narrative   Not on  file   Social Drivers of Health   Financial Resource Strain: Not on file  Food Insecurity: Not on file  Transportation Needs: Not on file  Physical Activity: Not on file  Stress: Not on file  Social Connections: Not on file     Family History: The patient's family history includes Colon polyps in his mother; Crohn's disease in his daughter; Diabetes in his father; Heart disease in his brother, brother, and father; Hyperlipidemia in his father; Hypertension in his father.  ROS:   Please see the history of  present illness.     All other systems reviewed and are negative.  EKGs/Labs/Other Studies Reviewed:    The following studies were reviewed today: Echo 05/31/23: IMPRESSIONS     1. Left ventricular ejection fraction, by estimation, is 65 to 70%. Left  ventricular ejection fraction by 3D volume is 69 %. The left ventricle has  normal function. Left ventricular endocardial border not optimally defined  to evaluate regional wall  motion. Left ventricular diastolic parameters were normal. The average  left ventricular global longitudinal strain is -18.5 %. The global  longitudinal strain is normal.   2. Right ventricular systolic function is normal. The right ventricular  size is normal.   3. The mitral valve is normal in structure. Mild to moderate mitral valve  regurgitation. No evidence of mitral stenosis.   4. The aortic valve is tricuspid. Aortic valve regurgitation is mild. No  aortic stenosis is present.      Coronary CTA 06/28/23: FINDINGS: Image quality: Good   Noise artifact is: Mild slab artifact   Coronary calcium  score is 1313.   Coronary arteries: Normal coronary origins.  Right dominance.   Right Coronary Artery: Minimal plaque proximal RCA, <25% stenosis, mild plaque mid RCA, 25-49% stenosis. Moderate mixed plaque distal RCA, 50-69% stenosis.   Left Main Coronary Artery: Minimal mixed atherosclerotic plaque <25%.   Left Anterior Descending Coronary  Artery: Moderate mixed plaque proximal LAD, 50-69% stenosis. In the proximal to mid LAD, there is a severe calcified stenosis, 70-99% stenosis. There is a serial mid LAD severe stenosis 70-99%. Moderate mixed plaque distal LAD, 50-69% stenosis.   Left Circumflex Artery: Minimal mixed plaque in the mid LCx and proximal OM3, <25% stenosis.   Aorta: Normal size, 32 mm at the mid ascending aorta (level of the PA bifurcation) measured double oblique.   Aortic Valve: Trivial calcifications. AV calcium  score 40.   Other findings:   Normal pulmonary vein drainage into the left atrium.   Normal left atrial appendage without thrombus.   Normal size of the pulmonary artery.   Severe biatrial dilation.   Please see separate report from [facility] for non-cardiac findings.   IMPRESSION: 1. Severe CAD in the proximal-mid LAD and mid LAD, 70-99% stenosis, CADRADS 4. CT FFR will be performed and reported separately.   2. Total plaque volume 934 mm3 which is 55th percentile for age- and sex-matched controls (calcified plaque 273 mm3; non-calcified plaque 661 mm3). TPV is extensive.   3. Coronary calcium  score of 1313.   4. Normal coronary origins with right dominance. FINDINGS: 1. Left Main: Low likelihood of hemodynamic significance.   2. LAD: High likelihood of hemodynamic significance in the mid LAD, FFR 0.65, delta 0.15. Mid LAD dimension is approximately 3 mm at site of lesion with dense calcification. 3. LCX: Low likelihood of hemodynamic significance. 4. RCA: Low likelihood of hemodynamic significance.   IMPRESSION: 1. CT FFR analysis showed high likelihood of hemodynamically significant stenosis in the mid LAD.      Recent Labs: 09/21/2023: ALT 23; BUN 38; Creatinine, Ser 1.17; Potassium 4.7; Sodium 141  Recent Lipid Panel    Component Value Date/Time   CHOL 111 09/21/2023 0828   TRIG 43 09/21/2023 0828   HDL 53 09/21/2023 0828   CHOLHDL 2.1 09/21/2023 0828    LDLCALC 47 09/21/2023 0828  Dated 02/23/23: normal CBC. BUN 46, creatinine 0.91. otherwise CMET and TSH normal.    Risk Assessment/Calculations:       Physical Exam:  VS:  BP (!) 140/82 (BP Location: Left Arm, Patient Position: Sitting, Cuff Size: Small)   Pulse 62   Ht 5' 9.5 (1.765 m)   Wt 126 lb 9.6 oz (57.4 kg)   SpO2 98%   BMI 18.43 kg/m     Wt Readings from Last 3 Encounters:  02/15/24 126 lb 9.6 oz (57.4 kg)  09/28/23 128 lb 6.4 oz (58.2 kg)  07/05/23 128 lb 6.4 oz (58.2 kg)     GEN:  Well nourished, thin in no acute distress HEENT: Normal NECK: No JVD; No carotid bruits LYMPHATICS: No lymphadenopathy CARDIAC: RRR, no murmurs, rubs, gallops RESPIRATORY:  Clear to auscultation without rales, wheezing or rhonchi  ABDOMEN: Soft, non-tender, non-distended MUSCULOSKELETAL:  No edema; No deformity  SKIN: Warm and dry NEUROLOGIC:  Alert and oriented x 3 PSYCHIATRIC:  Normal affect   ASSESSMENT:    1. Coronary artery disease of native artery of native heart with stable angina pectoris   2. Hypercholesteremia       PLAN:    In order of problems listed above:  DOE/chest pain. Coronary CTA indicates a significant stenosis in the mid LAD which is likely contributing to his symptoms. He also has prior lobectomy and deconditioning. Possible component of COPD. Fortunately his Echo looked good.  Symptoms are stable on medical therapy and he would like to continue.  Not a candidate for beta blocker due to bradycardia. Will follow up in 6 months.  Bifascicular block with LAHB and RBBB. No evidence of AV block. Asymptomatic. Will observe.  HLD. Excellent response to statin with LDL 47. Now with dry eyes which may be statin related. Will give 2 month statin holiday. If no improvement should resume. If symptoms do get significantly better may need to consider alternative lipid lowering therapy Sinusitis. He is going to check with PCP about treatment.              Medication Adjustments/Labs and Tests Ordered: Current medicines are reviewed at length with the patient today.  Concerns regarding medicines are outlined above.  No orders of the defined types were placed in this encounter.  No orders of the defined types were placed in this encounter.   Patient Instructions  Medication Instructions:   *If you need a refill on your cardiac medications before your next appointment, please call your pharmacy*  Lab Work:  If you have labs (blood work) drawn today and your tests are completely normal, you will receive your results only by: MyChart Message (if you have MyChart) OR A paper copy in the mail If you have any lab test that is abnormal or we need to change your treatment, we will call you to review the results.  Testing/Procedures:   Follow-Up: At [facility], you and your health needs are our priority.  As part of our continuing mission to provide you with exceptional heart care, our providers are all part of one team.  This team includes your primary Cardiologist (physician) and Advanced Practice Providers or APPs (Physician Assistants and Nurse Practitioners) who all work together to provide you with the care you need, when you need it.  Your next appointment:      Provider:     We recommend signing up for the patient portal called MyChart.  Sign up information is provided on this After Visit Summary.  MyChart is used to connect with patients for Virtual Visits (Telemedicine).  Patients are able to view lab/test results, encounter notes, upcoming appointments, etc.  Non-urgent messages can be sent to your provider as well.   To learn more about what you can do with MyChart, go to [email or web address].   Other Instructions      Signed, Saathvik Every, MD  02/15/2024 4:42 PM    [facility]

## 2024-02-10 ENCOUNTER — Ambulatory Visit: Admitting: Podiatry

## 2024-02-10 ENCOUNTER — Encounter: Payer: Self-pay | Admitting: Podiatry

## 2024-02-10 DIAGNOSIS — B351 Tinea unguium: Secondary | ICD-10-CM | POA: Diagnosis not present

## 2024-02-10 DIAGNOSIS — M79675 Pain in left toe(s): Secondary | ICD-10-CM

## 2024-02-10 DIAGNOSIS — M79674 Pain in right toe(s): Secondary | ICD-10-CM

## 2024-02-10 NOTE — Progress Notes (Signed)
This patient presents to the office for evaluation of callus on bottom outside of his left foot.  He has previously been seen for capsulitis and callus under the bone left foot.  He says there is pain at this visit.    he would like his nails to trimmed also.  The nails are painful walking and wearing his shoes.  He presents for evaluation and treatment.  General Appearance  Alert, conversant and in no acute stress.  Vascular  Dorsalis pedis and posterior tibial  pulses are  weakly palpable  bilaterally.  Capillary return is within normal limits  bilaterally. Temperature is within normal limits  bilaterally.  Neurologic  Senn-Weinstein monofilament wire test within normal limits  bilaterally. Muscle power within normal limits bilaterally.  Nails Thick disfigured discolored nails with subungual debris  hallux nails bilaterally. No evidence of bacterial infection or drainage bilaterally.  Orthopedic  No limitations of motion  feet .  No crepitus or effusions noted.  Tailors bunion fifth MPJ left foot.  Skin  normotropic skin  noted bilaterally.  No signs of infections or ulcers noted.  Onychomycosis  B/L     Debrided his nails with nail nipper followed by dremel tool usage.  RTC 3   months  Helane Gunther DPM

## 2024-02-15 ENCOUNTER — Encounter: Payer: Self-pay | Admitting: Cardiology

## 2024-02-15 ENCOUNTER — Ambulatory Visit: Attending: Cardiology | Admitting: Cardiology

## 2024-02-15 VITALS — BP 140/82 | HR 62 | Ht 69.5 in | Wt 126.6 lb

## 2024-02-15 DIAGNOSIS — E7800 Pure hypercholesterolemia, unspecified: Secondary | ICD-10-CM | POA: Diagnosis not present

## 2024-02-15 DIAGNOSIS — I25118 Atherosclerotic heart disease of native coronary artery with other forms of angina pectoris: Secondary | ICD-10-CM

## 2024-02-15 NOTE — Patient Instructions (Addendum)
 Medication Instructions:  Hold Rosuvastatin  Continue all other medications  *If you need a refill on your cardiac medications before your next appointment, please call your pharmacy*  Lab Work: None ordered   Testing/Procedures: None ordered  Follow-Up: At [facility], you and your health needs are our priority.  As part of our continuing mission to provide you with exceptional heart care, our providers are all part of one team.  This team includes your primary Cardiologist (physician) and Advanced Practice Providers or APPs (Physician Assistants and Nurse Practitioners) who all work together to provide you with the care you need, when you need it.  Your next appointment:  6 months   Call in Feb to schedule May appointment     Provider:  Dr.Jordan   We recommend signing up for the patient portal called MyChart.  Sign up information is provided on this After Visit Summary.  MyChart is used to connect with patients for Virtual Visits (Telemedicine).  Patients are able to view lab/test results, encounter notes, upcoming appointments, etc.  Non-urgent messages can be sent to your provider as well.   To learn more about what you can do with MyChart, go to [email or web address].   Other Instructions

## 2024-02-28 DIAGNOSIS — M810 Age-related osteoporosis without current pathological fracture: Secondary | ICD-10-CM | POA: Diagnosis not present

## 2024-02-28 DIAGNOSIS — Z5181 Encounter for therapeutic drug level monitoring: Secondary | ICD-10-CM | POA: Diagnosis not present

## 2024-03-27 ENCOUNTER — Telehealth: Payer: Self-pay | Admitting: Cardiology

## 2024-03-27 NOTE — Telephone Encounter (Signed)
 Spoke with pt and let him know that per Dr. Gib last OV note from November, pt is to restart Rosuvastatin  if symptoms do not improve. Explained this to pt and he stated he will be restarting this medication. Asked pt if he had enough pills left or if he needed a refill, pt stated he had plenty still at home and did not need a refill at this time.

## 2024-03-27 NOTE — Telephone Encounter (Signed)
 Pt c/o medication issue:  1. Name of Medication: rosuvastatin  (CRESTOR ) 5 MG tablet   2. How are you currently taking this medication (dosage and times per day)? no  3. Are you having a reaction (difficulty breathing--STAT)? No  4. What is your medication issue? Pt states he was told to hold off of the medication for 6 weeks due to having chronic dry eyes, PT states it has been 6 weeks and he is still experiencing dry eyes. PT wants to know if he can start back his medication because he believes the medication was not causing the dry eyes.

## 2024-05-11 ENCOUNTER — Ambulatory Visit: Admitting: Podiatry
